# Patient Record
Sex: Male | Born: 1965 | Race: White | Hispanic: No | Marital: Married | Smoking: Former smoker
Health system: Southern US, Community
[De-identification: ages and names within clinical notes are randomized; demographics above are authoritative.]

## PROBLEM LIST (undated history)

## (undated) DIAGNOSIS — B20 Human immunodeficiency virus [HIV] disease: Secondary | ICD-10-CM

## (undated) DIAGNOSIS — F4321 Adjustment disorder with depressed mood: Secondary | ICD-10-CM

## (undated) DIAGNOSIS — D751 Secondary polycythemia: Secondary | ICD-10-CM

## (undated) DIAGNOSIS — D099 Carcinoma in situ, unspecified: Secondary | ICD-10-CM

## (undated) DIAGNOSIS — F329 Major depressive disorder, single episode, unspecified: Secondary | ICD-10-CM

## (undated) DIAGNOSIS — R7989 Other specified abnormal findings of blood chemistry: Secondary | ICD-10-CM

## (undated) DIAGNOSIS — C8580 Other specified types of non-Hodgkin lymphoma, unspecified site: Secondary | ICD-10-CM

## (undated) DIAGNOSIS — Z630 Problems in relationship with spouse or partner: Secondary | ICD-10-CM

## (undated) DIAGNOSIS — K602 Anal fissure, unspecified: Secondary | ICD-10-CM

## (undated) DIAGNOSIS — J309 Allergic rhinitis, unspecified: Secondary | ICD-10-CM

## (undated) DIAGNOSIS — F32A Depression, unspecified: Secondary | ICD-10-CM

## (undated) DIAGNOSIS — D696 Thrombocytopenia, unspecified: Secondary | ICD-10-CM

## (undated) DIAGNOSIS — F17200 Nicotine dependence, unspecified, uncomplicated: Secondary | ICD-10-CM

## (undated) DIAGNOSIS — E291 Testicular hypofunction: Secondary | ICD-10-CM

## (undated) DIAGNOSIS — D702 Other drug-induced agranulocytosis: Secondary | ICD-10-CM

## (undated) DIAGNOSIS — C8589 Other specified types of non-Hodgkin lymphoma, extranodal and solid organ sites: Secondary | ICD-10-CM

## (undated) DIAGNOSIS — S92911A Unspecified fracture of right toe(s), initial encounter for closed fracture: Secondary | ICD-10-CM

## (undated) HISTORY — DX: Depression, unspecified: F32.A

## (undated) HISTORY — DX: Secondary polycythemia: D75.1

## (undated) HISTORY — DX: Other specified types of non-hodgkin lymphoma, unspecified site: C85.80

## (undated) HISTORY — DX: Major depressive disorder, single episode, unspecified: F32.9

## (undated) HISTORY — DX: Other specified types of non-hodgkin lymphoma, extranodal and solid organ sites: C85.89

## (undated) HISTORY — DX: Unspecified fracture of right toe(s), initial encounter for closed fracture: S92.911A

## (undated) HISTORY — DX: Human immunodeficiency virus (HIV) disease: B20

## (undated) HISTORY — DX: Other drug-induced agranulocytosis: D70.2

## (undated) HISTORY — DX: Adjustment disorder with depressed mood: F43.21

## (undated) HISTORY — DX: Problems in relationship with spouse or partner: Z63.0

## (undated) HISTORY — DX: Allergic rhinitis, unspecified: J30.9

## (undated) HISTORY — DX: Testicular hypofunction: E29.1

## (undated) HISTORY — DX: Carcinoma in situ, unspecified: D09.9

## (undated) HISTORY — DX: Anal fissure, unspecified: K60.2

## (undated) HISTORY — DX: Thrombocytopenia, unspecified: D69.6

## (undated) HISTORY — DX: Nicotine dependence, unspecified, uncomplicated: F17.200

## (undated) HISTORY — DX: Other specified abnormal findings of blood chemistry: R79.89

---
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
## 2004-06-06 HISTORY — PX: NASAL SEPTUM SURGERY: SHX37

## 2006-06-20 ENCOUNTER — Ambulatory Visit (HOSPITAL_COMMUNITY): Admission: RE | Admit: 2006-06-20 | Discharge: 2006-06-20 | Payer: Self-pay | Admitting: Neurology

## 2008-01-24 ENCOUNTER — Ambulatory Visit: Payer: Self-pay | Admitting: Otolaryngology

## 2008-01-31 ENCOUNTER — Ambulatory Visit: Payer: Self-pay | Admitting: Otolaryngology

## 2008-05-29 ENCOUNTER — Emergency Department (HOSPITAL_COMMUNITY): Admission: EM | Admit: 2008-05-29 | Discharge: 2008-05-29 | Payer: Self-pay | Admitting: Emergency Medicine

## 2008-06-06 HISTORY — PX: OTHER SURGICAL HISTORY: SHX169

## 2008-11-09 ENCOUNTER — Inpatient Hospital Stay (HOSPITAL_COMMUNITY): Admission: EM | Admit: 2008-11-09 | Discharge: 2008-11-11 | Payer: Self-pay | Admitting: Emergency Medicine

## 2009-01-05 ENCOUNTER — Encounter: Admission: RE | Admit: 2009-01-05 | Discharge: 2009-01-05 | Payer: Self-pay | Admitting: Specialist

## 2009-01-09 ENCOUNTER — Ambulatory Visit (HOSPITAL_BASED_OUTPATIENT_CLINIC_OR_DEPARTMENT_OTHER): Admission: RE | Admit: 2009-01-09 | Discharge: 2009-01-09 | Payer: Self-pay | Admitting: Specialist

## 2010-04-23 ENCOUNTER — Encounter: Payer: Self-pay | Admitting: Pulmonary Disease

## 2010-04-27 ENCOUNTER — Ambulatory Visit: Payer: Self-pay | Admitting: Pulmonary Disease

## 2010-04-27 DIAGNOSIS — J159 Unspecified bacterial pneumonia: Secondary | ICD-10-CM | POA: Insufficient documentation

## 2010-04-27 DIAGNOSIS — J309 Allergic rhinitis, unspecified: Secondary | ICD-10-CM | POA: Insufficient documentation

## 2010-04-27 DIAGNOSIS — R51 Headache: Secondary | ICD-10-CM

## 2010-04-27 DIAGNOSIS — R519 Headache, unspecified: Secondary | ICD-10-CM | POA: Insufficient documentation

## 2010-04-28 ENCOUNTER — Encounter: Payer: Self-pay | Admitting: Pulmonary Disease

## 2010-05-27 ENCOUNTER — Ambulatory Visit: Payer: Self-pay | Admitting: Pulmonary Disease

## 2010-05-28 ENCOUNTER — Ambulatory Visit: Payer: Self-pay | Admitting: Pulmonary Disease

## 2010-06-06 HISTORY — PX: OTHER SURGICAL HISTORY: SHX169

## 2010-06-10 ENCOUNTER — Telehealth (INDEPENDENT_AMBULATORY_CARE_PROVIDER_SITE_OTHER): Payer: Self-pay | Admitting: *Deleted

## 2010-06-11 ENCOUNTER — Encounter: Payer: Self-pay | Admitting: Adult Health

## 2010-06-11 ENCOUNTER — Ambulatory Visit
Admission: RE | Admit: 2010-06-11 | Discharge: 2010-06-11 | Payer: Self-pay | Source: Home / Self Care | Attending: Adult Health | Admitting: Adult Health

## 2010-06-14 ENCOUNTER — Ambulatory Visit: Admission: RE | Admit: 2010-06-14 | Payer: Self-pay | Source: Home / Self Care | Admitting: Pulmonary Disease

## 2010-06-17 ENCOUNTER — Ambulatory Visit
Admission: RE | Admit: 2010-06-17 | Discharge: 2010-06-17 | Payer: Self-pay | Source: Home / Self Care | Attending: Pulmonary Disease | Admitting: Pulmonary Disease

## 2010-06-17 LAB — CONVERTED CEMR LAB
HIV-1 antibody: POSITIVE — AB
HIV-2 Ab: NEGATIVE
HIV: REACTIVE

## 2010-06-18 ENCOUNTER — Telehealth (INDEPENDENT_AMBULATORY_CARE_PROVIDER_SITE_OTHER): Payer: Self-pay | Admitting: *Deleted

## 2010-06-21 ENCOUNTER — Ambulatory Visit: Payer: Self-pay | Admitting: Cardiology

## 2010-06-22 ENCOUNTER — Telehealth: Payer: Self-pay | Admitting: Pulmonary Disease

## 2010-06-23 ENCOUNTER — Telehealth (INDEPENDENT_AMBULATORY_CARE_PROVIDER_SITE_OTHER): Payer: Self-pay | Admitting: *Deleted

## 2010-06-28 ENCOUNTER — Telehealth: Payer: Self-pay | Admitting: Pulmonary Disease

## 2010-06-30 ENCOUNTER — Telehealth: Payer: Self-pay | Admitting: Pulmonary Disease

## 2010-07-01 ENCOUNTER — Telehealth: Payer: Self-pay | Admitting: Pulmonary Disease

## 2010-07-02 ENCOUNTER — Ambulatory Visit
Admission: RE | Admit: 2010-07-02 | Discharge: 2010-07-02 | Payer: Self-pay | Source: Home / Self Care | Attending: Pulmonary Disease | Admitting: Pulmonary Disease

## 2010-07-04 LAB — CULTURE, RESPIRATORY W GRAM STAIN: Gram Stain: NONE SEEN

## 2010-07-05 ENCOUNTER — Telehealth: Payer: Self-pay

## 2010-07-05 DIAGNOSIS — B20 Human immunodeficiency virus [HIV] disease: Secondary | ICD-10-CM | POA: Insufficient documentation

## 2010-07-05 LAB — PNEUMOCYSTIS JIROVECI SMEAR BY DFA: Pneumocystis jiroveci Ag: NEGATIVE

## 2010-07-06 NOTE — Assessment & Plan Note (Signed)
Summary: ABNORMAL CHEST CT MULTIPLE NODULES/MHF   Visit Type:  Initial Consult Copy to:  pcp Primary Provider/Referring Provider:  Dr. Shela Commons [facility]  CC:  Pulmonary consult.  History of Present Illness: 44/M ex smoker for evaluation of abnormal imaging studies. He developed head cold with sore throat in oct '11 , CXR showed a RUL pneumonia  - initially given levaquin 750 x 5ds, when consolidation persisted on CXR  ,leavaquin 500 was given x 14 days & CT was obtained. CT chest 04/23/10 showed RUL wedge shaped consolidation with additional nodules & a peripheral LLL 6 mm nodule. There was a 2.0 x 1.3 cm right hilar LN. I have reviewed all images He denies chest pain, cough, hemoptysis, fevers or weight loss. He has been able to work through this illness.  Preventive Screening-Counseling & Management  Alcohol-Tobacco     Alcohol drinks/day: occ     Smoking Status: quit     Packs/Day: 1.5     Year Started: 1985     Year Quit: 2006  Current Medications (verified): 1)  Celexa 40 Mg Tabs (Citalopram Hydrobromide) .... Take 1 Tablet By Mouth Once A Day 2)  Flonase 50 Mcg/act Susp (Fluticasone Propionate) .... 2 Sprays Each Nostril Once Daily 3)  Levaquin 500 Mg Tabs (Levofloxacin) .... Take 1 Tablet By Mouth Once A Day  Allergies (verified): 1)  ! Sulfa  Past History:  Past Medical History: HEADACHE, CHRONIC (ICD-784.0) ALLERGIC RHINITIS (ICD-477.9)  Past Surgical History: Monteggias Fracture surgery- left arm 2010 Sinus Surgery-2009  Family History: Family History Emphysema -father Clotting disorders-father Family History Lung Cancer-paternal grandfather Family History C V A / Stroke -father  Social History: Marital Status: Single, lives with roommate Children: no Occupation:Project Analyst  Smoking Status:  quit Packs/Day:  1.5 Alcohol drinks/day:  occ  Review of Systems       The patient complains of shortness of breath with activity, coughing up blood,  chest pain, difficulty swallowing, sore throat, headaches, nasal congestion/difficulty breathing through nose, and fever.  The patient denies shortness of breath at rest, productive cough, non-productive cough, irregular heartbeats, acid heartburn, indigestion, loss of appetite, weight change, abdominal pain, tooth/dental problems, sneezing, itching, ear ache, anxiety, depression, hand/feet swelling, joint stiffness or pain, rash, and change in color of mucus.    Vital Signs:  Patient profile:   45 year old male Height:      69 inches Weight:      193.75 pounds BMI:     28.72 O2 Sat:      96 % on Room air Temp:     98.8 degrees F oral Pulse rate:   80 / minute BP sitting:   114 / 86  (left arm) Cuff size:   regular  Vitals Entered By: Zackery Barefoot CMA (April 27, 2010 9:37 AM)  O2 Flow:  Room air CC: Pulmonary consult Comments Medications reviewed with patient Verified contact number and pharmacy with patient Zackery Barefoot [facility]  April 27, 2010 9:37 AM    Physical Exam  Additional Exam:  wt 194 April 27, 2010  Gen. Pleasant, well-nourished, in no distress, normal affect ENT - no lesions, no post nasal drip Neck: No JVD, no thyromegaly, no carotid bruits Lungs: no use of accessory muscles, no dullness to percussion, clear without rales or rhonchi  Cardiovascular: Rhythm regular, heart sounds  normal, no murmurs or gallops, no peripheral edema Abdomen: soft and non-tender, no hepatosplenomegaly, BS normal. Musculoskeletal: No deformities, no cyanosis or clubbing Neuro:  alert, non  focal     Impression & Recommendations:  Problem # 1:  BACTERIAL PNEUMONIA (ICD-482.9) Favor slight atypical appearance of bacterial pneumonia with reactive lymphadenopathy given h/o acute illness Obtain labs COmplete course of levaquin Rpt CXR q month to resolution. RPt CT in 3 months Explained the need for FU imaging to resolution His updated medication list for this problem  includes:    Levaquin 500 Mg Tabs (Levofloxacin) .Marland Kitchen... Take 1 tablet by mouth once a day  Orders: TLB-CBC Platelet - w/Differential (85025-CBCD) TLB-BMP (Basic Metabolic Panel-BMET) (80048-METABOL) TLB-Hepatic/Liver Function Pnl (80076-HEPATIC) Consultation Level III (99243)Future Orders: T-2 View CXR (71020TC) ... 05/27/2010  Medications Added to Medication List This Visit: 1)  Celexa 40 Mg Tabs (Citalopram hydrobromide) .... Take 1 tablet by mouth once a day 2)  Flonase 50 Mcg/act Susp (Fluticasone propionate) .... 2 sprays each nostril once daily 3)  Levaquin 500 Mg Tabs (Levofloxacin) .... Take 1 tablet by mouth once a day  Patient Instructions: 1)  Copy sent to: Aleatha Borer 2)  Please schedule a follow-up appointment in 3 weeks with chest x ray     Immunization History:  Influenza Immunization History:    Influenza:  historical (03/08/2010)   Appended Document: ABNORMAL CHEST CT MULTIPLE NODULES/MHF labs 04/28/10 >> WC 4.4

## 2010-07-07 ENCOUNTER — Ambulatory Visit (INDEPENDENT_AMBULATORY_CARE_PROVIDER_SITE_OTHER): Payer: 59 | Admitting: Pulmonary Disease

## 2010-07-07 ENCOUNTER — Encounter: Payer: Self-pay | Admitting: Pulmonary Disease

## 2010-07-07 ENCOUNTER — Ambulatory Visit: Admit: 2010-07-07 | Payer: Self-pay | Admitting: Pulmonary Disease

## 2010-07-07 DIAGNOSIS — J159 Unspecified bacterial pneumonia: Secondary | ICD-10-CM

## 2010-07-07 DIAGNOSIS — B20 Human immunodeficiency virus [HIV] disease: Secondary | ICD-10-CM

## 2010-07-07 LAB — CONVERTED CEMR LAB
HIV-1 RNA Quant, Log: 5.16 — ABNORMAL HIGH (ref <1.30)
Hep A Total Ab: NEGATIVE
Hep B S Ab: NEGATIVE

## 2010-07-07 LAB — LEGIONELLA PROFILE(CULTURE+DFA/SMEAR): Legionella Antigen (DFA): NEGATIVE

## 2010-07-08 ENCOUNTER — Other Ambulatory Visit: Payer: Self-pay | Admitting: Pulmonary Disease

## 2010-07-08 DIAGNOSIS — R918 Other nonspecific abnormal finding of lung field: Secondary | ICD-10-CM

## 2010-07-08 DIAGNOSIS — R222 Localized swelling, mass and lump, trunk: Secondary | ICD-10-CM | POA: Insufficient documentation

## 2010-07-08 NOTE — Progress Notes (Signed)
Summary: speak to nurse  Phone Note Call from Patient Call back at [phone]   Caller: Patient Call For: alva Reason for Call: Talk to Nurse Summary of Call: Patient asking to speak to [facility].  He states he "hasn't been feeling well and that something is not right".  Patient scheduled for CT scan at the end of the month, but he is asking to speak to nurse about this. Initial call taken by: Lehman Prom,  June 10, 2010 4:52 PM  Follow-up for Phone Call        Spoke with pt.  He states that he has been having chest tightness and nightsweats since last seen and this was not going on before.  Appt with TP for 9 am tommorrow and go to ED sooner if needed.   Pt verbalized understanding. Follow-up by: Vernie Murders,  June 10, 2010 5:12 PM

## 2010-07-08 NOTE — Assessment & Plan Note (Signed)
Summary: NP follow up - PNA   Copy to:  pcp Primary Provider/Referring Provider:  Dr. Shela Commons. [facility]  CC:  2 week follow up - states still having right sided chest congestion and tightness in chest, feverish, night sweats, some wheezing / SOB x1week.  states symptoms improved after last OV, and then worsened again.  History of Present Illness: [name] ex smoker for evaluation of abnormal imaging studies. He developed head cold with sore throat in oct '11 , CXR showed a RUL pneumonia  - initially given levaquin 750 x 5ds, when consolidation persisted on CXR  ,leavaquin 500 was given x 14 days & CT was obtained. CT chest 04/23/10 showed RUL wedge shaped consolidation with additional nodules & a peripheral LLL 6 mm nodule. There was a 2.0 x 1.3 cm right hilar LN. I have reviewed all images  He has been able to work through this illness. Labs reviewed - WC nml, nml renal & liver fn  May 28, 2010  No new symptoms, When he lies down, he feels something on his right side. CXR shows persistent RUL consolidation. c/o night sweats, but he has had this x 20 yrs   June 11, 2010 --Presents for a 2 week follow up. States still having right sided chest congestion and tightness in chest, feverish, night sweats, some wheezing / SOB x1week.  states symptoms improved after last OV. His symptoms seem to wax and wane. Feels good today but yesterday has fatigue and night sweats. Pt is homosexual with several partners in past, one of which passed of HIV. He has never had a HIV test. He also has a friend being treated for TB exposure that he has visited recently. He does have birds in the house for years.   Medications Prior to Update: 1)  Celexa 40 Mg Tabs (Citalopram Hydrobromide) .... Take 1 Tablet By Mouth Once A Day 2)  Flonase 50 Mcg/act Susp (Fluticasone Propionate) .... 2 Sprays Each Nostril Once Daily  Current Medications (verified): 1)  Celexa 40 Mg Tabs (Citalopram Hydrobromide) .... Take 1  Tablet By Mouth Once A Day 2)  Flonase 50 Mcg/act Susp (Fluticasone Propionate) .... 2 Sprays Each Nostril Once Daily  Allergies (verified): 1)  ! Sulfa  Past History:  Past Medical History: Last updated: 04/27/2010 HEADACHE, CHRONIC (ICD-784.0) ALLERGIC RHINITIS (ICD-477.9)  Past Surgical History: Last updated: 04/27/2010 Monteggias Fracture surgery- left arm 2010 Sinus Surgery-2009  Family History: Last updated: 04/27/2010 Family History Emphysema -father Clotting disorders-father Family History Lung Cancer-paternal grandfather Family History C V A / Stroke -father  Social History: Last updated: 06/11/2010 Marital Status: Single, lives with roommate Children: no Occupation:Project Analyst  Former smoker age [age]  ~1PPD  Gay -  Birds -2 birds (cockateals)   Risk Factors: Smoking Status: quit (05/28/2010) Packs/Day: 1.5 (05/28/2010)  Social History: Marital Status: Single, lives with roommate Children: no Occupation:Project Analyst  Former smoker age [age]  ~1PPD  Gay -  Birds -2 birds (cockateals)   Review of Systems      See HPI  Vital Signs:  Patient profile:   45 year old male Height:      69 inches Weight:      191.13 pounds BMI:     28.33 O2 Sat:      97 % on Room air Temp:     99.2 degrees F oral Pulse rate:   82 / minute BP sitting:   124 / 84  (left arm) Cuff size:   regular  Vitals  Entered By: Boone Master CNA/MA (June 11, 2010 9:07 AM)  O2 Flow:  Room air CC: 2 week follow up - states still having right sided chest congestion and tightness in chest, feverish, night sweats, some wheezing / SOB x1week.  states symptoms improved after last OV, then worsened again Is Patient Diabetic? No Comments Medications reviewed with patient Daytime contact number verified with patient. Boone Master CNA/MA  June 11, 2010 9:06 AM    Physical Exam  Additional Exam:  wt 194 April 27, 2010 > 191 June 11, 2010  Gen. Pleasant,  well-nourished, in no distress, normal affect ENT - no lesions, no post nasal drip Neck: No JVD, no thyromegaly, no carotid bruits Lungs: no use of accessory muscles, no dullness to percussion, clear without rales or rhonchi  Cardiovascular: Rhythm regular, heart sounds  normal, no murmurs or gallops, no peripheral edema Musculoskeletal: No deformities, no cyanosis or clubbing      Impression & Recommendations:  Problem # 1:  BACTERIAL PNEUMONIA (ICD-482.9)  Persistent RUL Infiltrate -has upcoming follow up serial CT 07/05/10. pt w/ few risk factors will check for HIV and place PPD   Orders: T-HIV Antibody  (Reflex) ([id number]) Est. Patient Level IV (09811)  Complete Medication List: 1)  Celexa 40 Mg Tabs (Citalopram hydrobromide) .... Take 1 tablet by mouth once a day 2)  Flonase 50 Mcg/act Susp (Fluticasone propionate) .... 2 sprays each nostril once daily  Patient Instructions: 1)  Increase fluids and rest. 2)  Tylenol as needed  3)  Warm heat to chest 4)  Will call with lab results.  5)  Return on Monday 06/14/10 to read PPD (TB test).  6)  follow up Dr. Vassie Loll as scheduled  7)  follow up for CT as planned 1/30.  8)  Please contact office for sooner follow up if symptoms do not improve or worsen     Appended Document: Orders Update    Clinical Lists Changes  Orders: Added new Service order of TB Skin Test [phone]) - Signed Added new Service order of Admin 1st Vaccine (29562) - Signed Observations: Added new observation of TB-PPD LOT#: [id number] (06/11/2010 11:02) Added new observation of TB-PPD EXP: 09/30/2011 (06/11/2010 11:02) Added new observation of TB-PPD BY: Zackery Barefoot CMA (06/11/2010 11:02) Added new observation of TB-PPD RTE: ID (06/11/2010 11:02) Added new observation of TB-PPD DSE: 0.1 ml (06/11/2010 11:02) Added new observation of TB-PPD MFR: Sanofi Pasteur (06/11/2010 11:02) Added new observation of TB-PPD SITE: left forearm (06/11/2010  11:02) Added new observation of TB-PPD: PPD (06/11/2010 11:02)       Immunizations Administered:  PPD Skin Test:    Vaccine Type: PPD    Site: left forearm    Mfr: Sanofi Pasteur    Dose: 0.1 ml    Route: ID    Given by: Zackery Barefoot CMA    Exp. Date: 09/30/2011    Lot #: [id number]

## 2010-07-08 NOTE — Progress Notes (Signed)
Summary: ID referral  Phone Note Call from Patient   Caller: Patient Call For: alva Summary of Call: pt stated that [place] r told him to call back today if he had not heard back re: referral. work# [phone] Initial call taken by: Tivis Ringer, CNA,  June 23, 2010 3:40 PM  Follow-up for Phone Call        I did not advise pt to call, spoke with Katheren Shams and she advised TP did instruct pt to call if have not heard back from ID. Bjorn Loser do you  have an update on him getting an appointment. Zackery Barefoot CMA  June 24, 2010 11:50 AM   No, Tammy with ID stated that it would take about a month to get the appt when I spoke with her the other day. The ID Dept will call him when they schedule it. Alfonso Ramus  June 24, 2010 11:58 AM   Additional Follow-up for Phone Call Additional follow up Details #1::        LMOMTCB.Michel Bickers CMA  June 24, 2010 12:03 PM  PLEASE CALL PT BACK AFTER 2:00.Lacinda Axon  June 24, 2010 12:44 PM  LMOMTCB Vernie Murders  June 24, 2010 1:49 PM     Additional Follow-up for Phone Call Additional follow up Details #2::    Pt is aware it can take up to 1 month to get an appt with ID. Pt would like to know why it takes so long to get an appt. He wants to know if this will be the kind of care he can expect from ID? Is there any way to speed up the process for an appt? Pls advise.Michel Bickers [facility]  June 24, 2010 2:31 PM  Additional Follow-up for Phone Call Additional follow up Details #3:: Details for Additional Follow-up Action Taken: called pt will contact ID again to get definitive date-ok for 1 month out but just wants ov set up   Additional Follow-up by: Tammy Parrett NP,  June 24, 2010 5:40 PM  called spoke with Bjorn Loser [facility] to ask about the process for referral appts to ID.  per Bjorn Loser, each appt is made in the order that the referral was received.  so if, for example, pt has 15 referrals in front of his then ID will have to coordinate  and schedule those 15 appts before getting to his.  per Bjorn Loser, they can't pull his name in front of everyone else's as they service the entire [place] area.  she did give me her contact at ID: Tammy [phone].  per TP, please call patient and advise him of this.   Boone Master CNA/MA  June 25, 2010 10:04 AM   Called pt  and informed of ID referral process.  Contact name and number at ID given to pt.  Pt verbalized understanding. Abigail Miyamoto RN  June 25, 2010 10:13 AM

## 2010-07-08 NOTE — Progress Notes (Addendum)
Summary: ? procedure  Phone Note Call from Patient Call back at [phone]   Caller: Patient Call For: Mahaila Tischer Reason for Call: Talk to Nurse Summary of Call: Patient calling about procedure scheduled for tomorrow with Dr. Vassie Loll.  He needs to know if this is at [facility] or [facility]. Initial call taken by: Lehman Prom,  July 01, 2010 12:10 PM  Follow-up for Phone Call        Called and spoke with Marcelino Duster in cardio/pulm who advised pt can call her and will transfer to [facility] with instructions. Pt informed. [name] CMA  July 01, 2010 12:28 PM      Appended Document: New 042 addendum:  Discussed with Dr. Vassie Loll, new dx HIV, with pneumonia +/- malignancy Clinical Lists Changes  Orders: Added new Test order of T-HIV Viral Load [phone]) - Signed Added new Test order of T-HIV Genotype [phone]) - Signed Added new Test order of T-Hepatitis B Surface Antibody [phone]) - Signed Added new Test order of T-Hepatitis B Surface Antigen [phone]) - Signed Added new Test order of T-Hepatitis C Antibody ([id number]) - Signed Added new Test order of T-Hepatitis A Antibody ([id number]) - Signed Added new Test order of T-Syphilis Test ([facility]) [phone]) - Signed Added new Test order of T- GC Chlamydia ([id number]) - Signed Added new Test order of T-GC Probe, urine ([id number]) - Signed Added new Test order of T-CD4SP ([facility]) (CD4SP) - Signed

## 2010-07-08 NOTE — Progress Notes (Signed)
Summary: Call report on CT chest  Phone Note From Other Clinic   Caller: [facility] Summary of Call: Erskine Squibb called to give call report on pt CT chest. Dr. Vassie Loll has already signed off on results but I will forward message to him to see if anything further is needed.  pt has appt with Dr. Vassie Loll on 07-07-2010. Initial call taken by: Carron Curie CMA,  June 22, 2010 11:07 AM  Follow-up for Phone Call        will discuss with pt on fu visit Follow-up by: Comer Locket. Vassie Loll MD,  June 24, 2010 11:06 PM

## 2010-07-08 NOTE — Progress Notes (Signed)
Summary: speak to nurse  Phone Note Other Incoming Call back at [phone]   Caller: corey//[facility] Summary of Call: Wants to speak to nurse in ref to positive hiv on 1/6. Initial call taken by: Darletta Moll,  June 28, 2010 8:48 AM  Follow-up for Phone Call        Denyse Amass states he received the medical records he needed from health information. He did inquire on pt's TB results. Per Boone Master pt's TB skin test was negative. Zackery Barefoot CMA  June 28, 2010 11:08 AM

## 2010-07-08 NOTE — Progress Notes (Signed)
Summary: wants to know if referral has been made  Phone Note Call from Patient   Caller: Patient Call For: tammy parrett Summary of Call: Patient called back to check on referral that Shanda Bumps was going to set up for him. He didn't want to disclose what the referral was for. Patient can be reached on his cell [phone] Initial call taken by: Vedia Coffer,  June 18, 2010 2:21 PM  Follow-up for Phone Call        yes it has been made we are waiting to here from them if not heard from them by lunch on 1/18 please call back  Follow-up by: Rubye Oaks NP,  June 18, 2010 2:33 PM  Additional Follow-up for Phone Call Additional follow up Details #1::        Spoke with pt and notified of the above recs per TP.  Pt verbalized understanding. Additional Follow-up by: Vernie Murders,  June 18, 2010 2:52 PM

## 2010-07-08 NOTE — Assessment & Plan Note (Signed)
Summary: NP follow up - lab results   Copy to:  pcp Primary Provider/Referring Provider:  Dr. Shela Commons. [facility]  CC:  ov to discuss lab results and CT.  History of Present Illness: 44/M ex smoker for evaluation of abnormal imaging studies. He developed head cold with sore throat in oct '11 , CXR showed a RUL pneumonia  - initially given levaquin 750 x 5ds, when consolidation persisted on CXR  ,leavaquin 500 was given x 14 days & CT was obtained. CT chest 04/23/10 showed RUL wedge shaped consolidation with additional nodules & a peripheral LLL 6 mm nodule. There was a 2.0 x 1.3 cm right hilar LN. I have reviewed all images  He has been able to work through this illness. Labs reviewed - WC nml, nml renal & liver fn  May 28, 2010  No new symptoms, When he lies down, he feels something on his right side. CXR shows persistent RUL consolidation. c/o night sweats, but he has had this x 20 yrs   June 11, 2010 --Presents for a 2 week follow up. States still having right sided chest congestion and tightness in chest, feverish, night sweats, some wheezing / SOB x1week.  states symptoms improved after last OV. His symptoms seem to wax and wane. Feels good today but yesterday has fatigue and night sweats. Pt is homosexual with several partners in past, one of which passed of HIV. He has never had a HIV test. He also has a friend being treated for TB exposure that he has visited recently. He does have birds in the house for years.   June 17, 2010 --Presents for an ov to discuss lab resutls.Last ov pt w/ persistent RUL consolidation on xray w/ persistent  constituitional symptoms-  feverish, night sweats. A PPD was placed which was neg.  HIV testing was done for hx of possible exposure from partner that passed from AIDS (2002). HIV testing was reactive  and  w/ confirmatory western blot with HIV-1gG Ab  was positive. HIV -2 Ab neg.  I reviewed this with pt and discussed diagnosis. Pt verbalized  understanding. We discussed that he will be referred to ID clinic and he is in aggreement. We are going to set up CT scan 1 week earlier and follow up Dr. Vassie Loll to discuss results.   Medications Prior to Update: 1)  Celexa 40 Mg Tabs (Citalopram Hydrobromide) .... Take 1 Tablet By Mouth Once A Day 2)  Flonase 50 Mcg/act Susp (Fluticasone Propionate) .... 2 Sprays Each Nostril Once Daily  Current Medications (verified): 1)  Celexa 40 Mg Tabs (Citalopram Hydrobromide) .... Take 1 Tablet By Mouth Once A Day 2)  Flonase 50 Mcg/act Susp (Fluticasone Propionate) .... 2 Sprays Each Nostril Once Daily  Allergies (verified): 1)  ! Sulfa  Past History:  Past Medical History: Last updated: 04/27/2010 HEADACHE, CHRONIC (ICD-784.0) ALLERGIC RHINITIS (ICD-477.9)  Past Surgical History: Last updated: 04/27/2010 Monteggias Fracture surgery- left arm 2010 Sinus Surgery-2009  Family History: Last updated: 04/27/2010 Family History Emphysema -father Clotting disorders-father Family History Lung Cancer-paternal grandfather Family History C V A / Stroke -father  Social History: Last updated: 06/11/2010 Marital Status: Single, lives with roommate Children: no Occupation:Project Analyst  Former smoker age [age]  ~1PPD  Gay -  Birds -2 birds (cockateals)   Risk Factors: Smoking Status: quit (05/28/2010) Packs/Day: 1.5 (05/28/2010)  Review of Systems      See HPI  Vital Signs:  Patient profile:   45 year old male Height:  69 inches Weight:      191.13 pounds BMI:     28.33 O2 Sat:      97 % on Room air Temp:     97.8 degrees F oral Pulse rate:   79 / minute BP sitting:   122 / 78  (left arm) Cuff size:   regular  Vitals Entered By: Boone Master CNA/MA (June 17, 2010 4:41 PM)  O2 Flow:  Room air CC: ov to discuss lab results and CT Is Patient Diabetic? No Comments Medications reviewed with patient Daytime contact number verified with patient. Boone Master CNA/MA   June 17, 2010 4:41 PM    Physical Exam  Additional Exam:  wt 194 April 27, 2010 > 191 June 11, 2010 > 191 06/17/10/ Gen. Pleasant, well-nourished, in no distress, normal affect ENT - no lesions, no post nasal drip Neck: No JVD, no thyromegaly, no carotid bruits Lungs: no use of accessory muscles, no dullness to percussion, clear without rales or rhonchi  Cardiovascular: Rhythm regular, heart sounds  normal, no murmurs or gallops, no peripheral edema Musculoskeletal: No deformities, no cyanosis or clubbing      Impression & Recommendations:  Problem # 1:  BACTERIAL PNEUMONIA (ICD-482.9) HIV testing - showed REactive w/ western blot positive 1/2  refer to ID clinic  Will repeat CT scan. w/ follow up Dr. Vassie Loll  may need FOB if not improved  Plan;  Increase fluids and rest. Tylenol as needed  The ID clinic will be in touch with you please call back if not contacted by 1/ 16/12.  We are going to move up your date for CT scan-we will contact you.  Please contact office for sooner follow up if symptoms do not improve or worsen    Orders: Infectious Disease Referral (ID) Radiology Referral (Radiology) Est. Patient Level III (16109)  Patient Instructions: 1)  Increase fluids and rest. 2)  Tylenol as needed  3)  The ID clinic will be in touch with you please call back 4)  if not contacted by 1/ 16/12.  5)  We are going to move up your date for CT scan-we will contact you.  6)  Please contact office for sooner follow up if symptoms do not improve or worsen

## 2010-07-08 NOTE — Assessment & Plan Note (Signed)
Summary: follow up with cxr in [place] per pt late appt//jwr   Visit Type:  Follow-up Copy to:  pcp Primary Provider/Referring Provider:  Dr. Shela Commons. [facility]  CC:  Follow up. Pt c/o chest congestion and  right chest pain.  History of Present Illness: 44/M ex smoker for evaluation of abnormal imaging studies. He developed head cold with sore throat in oct '11 , CXR showed a RUL pneumonia  - initially given levaquin 750 x 5ds, when consolidation persisted on CXR  ,leavaquin 500 was given x 14 days & CT was obtained. CT chest 04/23/10 showed RUL wedge shaped consolidation with additional nodules & a peripheral LLL 6 mm nodule. There was a 2.0 x 1.3 cm right hilar LN. I have reviewed all images  He has been able to work through this illness. Labs reviewed - WC nml, nml renal & liver fn  May 28, 2010 3:02 PM  No new symptoms, When he lies down, he feels something on his right side. CXR shows persistent RUL consolidation. c/o night sweats, but he has had this x 20 yrs He denies chest pain, cough, dyspnea, hemoptysis, fevers or weight loss.  Preventive Screening-Counseling & Management  Alcohol-Tobacco     Alcohol drinks/day: occ     Smoking Status: quit     Packs/Day: 1.5     Year Started: 1985     Year Quit: 2006  Current Medications (verified): 1)  Celexa 40 Mg Tabs (Citalopram Hydrobromide) .... Take 1 Tablet By Mouth Once A Day 2)  Flonase 50 Mcg/act Susp (Fluticasone Propionate) .... 2 Sprays Each Nostril Once Daily  Allergies (verified): 1)  ! Sulfa  Past History:  Past Medical History: Last updated: 04/27/2010 HEADACHE, CHRONIC (ICD-784.0) ALLERGIC RHINITIS (ICD-477.9)  Social History: Last updated: 04/27/2010 Marital Status: Single, lives with roommate Children: no Occupation:Project Analyst   Review of Systems  The patient denies anorexia, fever, weight loss, weight gain, vision loss, decreased hearing, hoarseness, chest pain, syncope, dyspnea on exertion,  peripheral edema, prolonged cough, headaches, hemoptysis, abdominal pain, melena, hematochezia, severe indigestion/heartburn, hematuria, muscle weakness, suspicious skin lesions, difficulty walking, depression, unusual weight change, abnormal bleeding, enlarged lymph nodes, and angioedema.    Vital Signs:  Patient profile:   45 year old male Height:      69 inches Weight:      193 pounds BMI:     28.60 O2 Sat:      96 % on Room air Temp:     98.7 degrees F oral Pulse rate:   80 / minute BP sitting:   100 / 70  (left arm) Cuff size:   regular  Vitals Entered By: Zackery Barefoot CMA (May 28, 2010 2:36 PM)  O2 Flow:  Room air CC: Follow up. Pt c/o chest congestion and  right chest pain Comments Medications reviewed with patient Verified contact number and pharmacy with patient Zackery Barefoot [facility]  May 28, 2010 2:36 PM    Physical Exam  Additional Exam:  wt 194 April 27, 2010  Gen. 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 Patient Level III ([address]) Radiology Referral (Radiology)  Patient Instructions: 1)  Please schedule a follow-up appointment in 6 weeks after CT scan 2)  A Chest CT with Contrast has been recommended.  Your imaging study may require preauthorization.

## 2010-07-08 NOTE — Op Note (Signed)
  [name], [name]            ACCOUNT NO.:  [id number]  MEDICAL RECORD NO.:  [id number]          PATIENT TYPE:  AMB  LOCATION:  CARD                         FACILITY:  [facility]  PHYSICIAN:  Oretha Milch, MD      DATE OF BIRTH:  1965/12/15  DATE OF PROCEDURE:  07/02/2010 DATE OF DISCHARGE:                              OPERATIVE REPORT   PROCEDURE PERFORMED:  Video bronchoscopy with biopsies.  INDICATIONS FOR PROCEDURE:  Right upper lobe persistent consolidation since November 2011 in this 45 year old nonsmoker with recently diagnosed HIV infection.  His symptoms include pleuritic chest pain, minimal coughing, and night sweats.  DESCRIPTION OF PROCEDURE:  Written informed consent was obtained from the patient prior to the procedure.  Risks of the procedure including coughing, bleeding,  and a small chance of lung puncture requiring a chest tube were discussed in detail.  A 6 mg of Versed and 150 mcg of fentanyl were used in divided doses during the procedure.  Bronchoscope was inserted from the right naris, upper airway appeared normal.  Vocal cords showed normal appearance and motion.  A 2% lidocaine was used above the cords and 1% lidocaine was used below the cords.  The tracheobronchial tree was inspected up to the subsegmental level.  Left- sided and the right lower lobe airways appeared normal.  Attention was then turned to the right upper lobe.  In the medial subsegment of the apical segment of the right upper lobe, a white endobronchial lesion was noted obscuring the orifice.  Brushings were obtained from this area. Lavage was also obtained from the apical segment of right upper lobe. Transbronchial biopsies were then obtained from this subsegment and the adjoining subsegment of the apical segment of the right upper lobe.  The forceps could be passed beyond the lesion.  The patient tolerated the procedure well with minimal bleeding.  One such biopsy was sent in saline  for AFB culture.  A chest x-ray will be performed to rule out presence of pneumothorax.     Oretha Milch, MD     RVA/MEDQ  D:  07/02/2010  T:  07/02/2010  Job:  [id number]  Electronically Signed by Cyril Mourning MD on 07/08/2010 03:49:08 PM

## 2010-07-08 NOTE — Progress Notes (Signed)
Summary: returned call  Phone Note Call from Patient   Caller: Patient Call For: Kentrell Guettler Summary of Call: pt returned call from dr Vassie Loll. pt # [id number] Initial call taken by: Tivis Ringer, CNA,  June 30, 2010 9:02 AM  Follow-up for Phone Call        Spoke with pt.  He states that RA called and left him msg to call him back arounf 4:40 pm 1/24.  I advised that RA in the office this pm and fill forward him msg so he can return call.  He would like for you to call his cell [phone]. thanks! Follow-up by: Vernie Murders,  June 30, 2010 9:18 AM  Additional Follow-up for Phone Call Additional follow up Details #1::        discussed risks & benefits of bscopy scheduled for friday Additional Follow-up by: Comer Locket. Vassie Loll MD,  June 30, 2010 4:04 PM

## 2010-07-12 ENCOUNTER — Telehealth: Payer: Self-pay | Admitting: Pulmonary Disease

## 2010-07-13 ENCOUNTER — Telehealth (INDEPENDENT_AMBULATORY_CARE_PROVIDER_SITE_OTHER): Payer: Self-pay | Admitting: *Deleted

## 2010-07-14 NOTE — Progress Notes (Addendum)
  Phone Note Other Incoming   Summary of Call: Kim from ID at [facility] asked if we got the intake apaperwork & when he was being seen. checked with Tomasita Morrow. she will address this this week Initial call taken by: Golden Circle RN,  July 05, 2010 12:07 PM  New Problems: HIV INFECTION (ICD-042)   New Problems: HIV INFECTION (ICD-042)  Appended Document:  Royanne Foots told me about this pt. I can work him in once we get his data in the system. He should also see Tammy with intake. They will draw labs tomorrow

## 2010-07-14 NOTE — Assessment & Plan Note (Signed)
Summary: 6 wk follow up post ct/jwr   Vital Signs:  Patient profile:   45 year old male Height:      69 inches Weight:      187.6 pounds BMI:     27.80 O2 Sat:      96 % on Room air Temp:     98.2 degrees F oral Pulse rate:   88 / minute BP sitting:   112 / 78  (left arm) Cuff size:   regular  Vitals Entered By: Zackery Barefoot CMA (July 07, 2010 9:05 AM)  O2 Flow:  Room air CC: 6 week follow up. Pt c/o fevers at night range T-99, 100, cold, sweats, wheezing at night, DOE Comments Medications reviewed with patient Verified contact number and pharmacy with patient Zackery Barefoot CMA  July 07, 2010 9:06 AM    Visit Type:  Follow-up Copy to:  pcp Primary Provider/Referring Provider:  Dr. Shela Commons. [facility]  CC:  6 week follow up. Pt c/o fevers at night range T-99, 100, cold, sweats, wheezing at night, and DOE.  History of Present Illness: 44/M ex smoker for FU of persistent RUL consolidation since oct '11 He developed head cold with sore throat in oct '11 , CXR showed a RUL pneumonia  - initially given levaquin 750 x 5ds, when consolidation persisted on CXR  ,leavaquin 500 was given x 14 days & CT was obtained. CT chest 04/23/10 showed RUL wedge shaped consolidation with additional nodules & a peripheral LLL 6 mm nodule. There was a 2.0 x 1.3 cm right hilar LN. I have reviewed all images  He has been able to work through this illness. Labs reviewed - WC nml, nml renal & liver fn Pt is homosexual with several partners in past, one of which passed of HIV. He has never had a HIV test. He also has a friend being treated for TB exposure that he has visited recently. He does have birds in the house for years.   PPD was neg.  HIV pos.  July 07, 2010  RPt Ct showed persistent consolidation & new LUL 7 mm nodule. Bronchoscopy showed whitish endobronchial lesion in apical subsegment of RUL - brushings & TBBX >> markedly atypical cells s/o lymphoproliferative process,  special stains neg, afb/ fungal neg, resp cx - MSSA. I discussed these findings with him today   Preventive Screening-Counseling & Management  Alcohol-Tobacco     Alcohol drinks/day: occ     Smoking Status: quit     Packs/Day: 1.5     Year Started: 1985     Year Quit: 2006  Current Medications (verified): 1)  Celexa 40 Mg Tabs (Citalopram Hydrobromide) .... Take 1 Tablet By Mouth Once A Day 2)  Flonase 50 Mcg/act Susp (Fluticasone Propionate) .... 2 Sprays Each Nostril Once Daily 3)  Pramosone 1-2.5 % Lotn (Pramoxine-Hc) .... As Needed  Allergies (verified): 1)  ! Sulfa  Past History:  Past Medical History: Last updated: 04/27/2010 HEADACHE, CHRONIC (ICD-784.0) ALLERGIC RHINITIS (ICD-477.9)  Social History: Last updated: 06/11/2010 Marital Status: Single, lives with roommate Children: no Occupation:Project Analyst  Former smoker age [age]  ~1PPD  Gay -  Birds -2 birds (cockateals)   Review of Systems  The patient denies anorexia, fever, weight loss, weight gain, vision loss, decreased hearing, hoarseness, chest pain, syncope, dyspnea on exertion, peripheral edema, prolonged cough, headaches, hemoptysis, abdominal pain, melena, hematochezia, severe indigestion/heartburn, hematuria, muscle weakness, suspicious skin lesions, difficulty walking, depression, unusual weight change, abnormal bleeding, enlarged  lymph nodes, and angioedema.    Physical Exam  Additional Exam:  wt 194 April 27, 2010 > 191 June 11, 2010  Gen. 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 His updated medication list for this problem includes:    Avelox 400 Mg Tabs (Moxifloxacin hcl) ..... Once daily  Orders: Est. Patient Level V [phone]) Prescription Created Electronically [phone])  Problem # 2:  HIV INFECTION (ICD-042) D/w Dr Osvaldo Angst hep profile, CD4 , viral load & STD screen as suggested His updated medication list for this problem includes:    Avelox 400 Mg Tabs (Moxifloxacin hcl) ..... Once daily  Orders: T-Chlamydia Probe, genital [phone]) T-Chlamydia  Probe, urine [phone]) T-Hepatitis B Surface Antigen [phone]) T-Hepatitis B Surface Antibody [phone]) T-Hepatitis C Antibody ([phone]) T-RPR (Syphilis) (54270-62376) T- * Misc. Laboratory test [phone]) Est. Patient Level V [phone]) Prescription Created Electronically [phone])  Medications Added to Medication List This Visit: 1)  Pramosone 1-2.5 % Lotn (Pramoxine-hc) .... As needed 2)  Avelox 400 Mg Tabs (Moxifloxacin hcl) .... Once daily  Patient Instructions: 1)  Copy sent to: Dr Daiva Eves 2)  Please schedule a follow-up appointment in 2 weeks. 3)  Antibiotic x 10 days 4)  Blood work today - per rinfectoius disease MD 5)  I will review your findings with other doctors & coe up with plan Prescriptions: AVELOX 400 MG TABS (MOXIFLOXACIN HCL) once daily  #10 x 0   Entered and Authorized by:   Comer Locket Vassie Loll MD   Signed by:   Comer Locket Vassie Loll MD on 07/07/2010   Method used:   Electronically to        [facility]  [address] [phone]* (retail)       [address]       [place], [place]  [address]       Ph: [phone] or [phone]       Fax:  [phone]   RxID:   [id number]    Orders Added: 1)  T-Chlamydia Probe, genital [[id number]] 2)  T-Chlamydia  Probe, urine [phone] 3)  T-Hepatitis B Surface Antigen [[id number]] 4)  T-Hepatitis B Surface Antibody [[id number]] 5)  T-Hepatitis C Antibody [[id number]] 6)  T-RPR (Syphilis) [[phone]] 7)  T- * Misc. Laboratory test [99999] 8)  Est. Patient Level V [53299] 9)  Prescription Created Electronically [phone]     Appended Document: Orders Update Discussed at cancer conference d/w pt >> proceed with PET scan & PFTs   Clinical Lists Changes  Problems: Added new problem of MASS, LUNG (ICD-786.6) Orders: Added new Referral order of Radiology Referral (Radiology) - Signed Added new Referral order of Pulmonary Referral (Pulmonary) - Signed

## 2010-07-16 ENCOUNTER — Inpatient Hospital Stay (HOSPITAL_COMMUNITY): Payer: 59

## 2010-07-16 ENCOUNTER — Inpatient Hospital Stay (HOSPITAL_COMMUNITY)
Admission: AD | Admit: 2010-07-16 | Discharge: 2010-07-23 | DRG: 976 | Disposition: A | Discharge status: Home / Self Care | Payer: 59 | Source: Ambulatory Visit | Attending: Internal Medicine | Admitting: Internal Medicine

## 2010-07-16 ENCOUNTER — Encounter: Payer: Self-pay | Admitting: Infectious Disease

## 2010-07-16 ENCOUNTER — Encounter: Payer: Self-pay | Admitting: Internal Medicine

## 2010-07-16 ENCOUNTER — Ambulatory Visit (INDEPENDENT_AMBULATORY_CARE_PROVIDER_SITE_OTHER): Payer: 59 | Admitting: Infectious Disease

## 2010-07-16 DIAGNOSIS — L989 Disorder of the skin and subcutaneous tissue, unspecified: Secondary | ICD-10-CM | POA: Diagnosis present

## 2010-07-16 DIAGNOSIS — R222 Localized swelling, mass and lump, trunk: Secondary | ICD-10-CM

## 2010-07-16 DIAGNOSIS — B20 Human immunodeficiency virus [HIV] disease: Secondary | ICD-10-CM

## 2010-07-16 DIAGNOSIS — K649 Unspecified hemorrhoids: Secondary | ICD-10-CM | POA: Diagnosis present

## 2010-07-16 DIAGNOSIS — J329 Chronic sinusitis, unspecified: Secondary | ICD-10-CM | POA: Diagnosis present

## 2010-07-16 DIAGNOSIS — F341 Dysthymic disorder: Secondary | ICD-10-CM | POA: Diagnosis present

## 2010-07-16 DIAGNOSIS — R0602 Shortness of breath: Secondary | ICD-10-CM

## 2010-07-16 DIAGNOSIS — R509 Fever, unspecified: Secondary | ICD-10-CM

## 2010-07-16 DIAGNOSIS — Z23 Encounter for immunization: Secondary | ICD-10-CM

## 2010-07-16 DIAGNOSIS — Z9189 Other specified personal risk factors, not elsewhere classified: Secondary | ICD-10-CM

## 2010-07-16 DIAGNOSIS — J189 Pneumonia, unspecified organism: Secondary | ICD-10-CM

## 2010-07-16 DIAGNOSIS — C8588 Other specified types of non-Hodgkin lymphoma, lymph nodes of multiple sites: Secondary | ICD-10-CM | POA: Diagnosis present

## 2010-07-16 LAB — COMPREHENSIVE METABOLIC PANEL
Alkaline Phosphatase: 72 U/L (ref 39–117)
BUN: 10 mg/dL (ref 6–23)
Creatinine, Ser: 0.88 mg/dL (ref 0.4–1.5)
Glucose, Bld: 73 mg/dL (ref 70–99)
Potassium: 3.8 mEq/L (ref 3.5–5.1)
Total Protein: 7.2 g/dL (ref 6.0–8.3)

## 2010-07-16 LAB — URINALYSIS, ROUTINE W REFLEX MICROSCOPIC
Leukocytes, UA: NEGATIVE
Protein, ur: NEGATIVE mg/dL
Specific Gravity, Urine: 1.025 (ref 1.005–1.030)
Urine Glucose, Fasting: NEGATIVE mg/dL
Urobilinogen, UA: 0.2 mg/dL (ref 0.0–1.0)

## 2010-07-16 LAB — CBC
HCT: 36.6 % — ABNORMAL LOW (ref 39.0–52.0)
MCH: 30.2 pg (ref 26.0–34.0)
MCHC: 33.3 g/dL (ref 30.0–36.0)
MCV: 90.6 fL (ref 78.0–100.0)
RDW: 13.1 % (ref 11.5–15.5)
WBC: 7.1 10*3/uL (ref 4.0–10.5)

## 2010-07-16 LAB — URINE MICROSCOPIC-ADD ON

## 2010-07-17 DIAGNOSIS — R509 Fever, unspecified: Secondary | ICD-10-CM

## 2010-07-17 DIAGNOSIS — R222 Localized swelling, mass and lump, trunk: Secondary | ICD-10-CM

## 2010-07-17 DIAGNOSIS — B20 Human immunodeficiency virus [HIV] disease: Secondary | ICD-10-CM

## 2010-07-17 LAB — INFLUENZA PANEL BY PCR (TYPE A & B)
H1N1 flu by pcr: NOT DETECTED
Influenza A By PCR: NEGATIVE

## 2010-07-18 LAB — URINE CULTURE
Colony Count: NO GROWTH
Culture: NO GROWTH
Special Requests: NEGATIVE

## 2010-07-18 LAB — LEGIONELLA ANTIGEN, URINE: Legionella Antigen, Urine: NEGATIVE

## 2010-07-19 ENCOUNTER — Encounter (HOSPITAL_COMMUNITY)
Admission: RE | Admit: 2010-07-19 | Discharge: 2010-07-19 | Disposition: A | Discharge status: Home / Self Care | Payer: 59 | Source: Ambulatory Visit | Attending: Pulmonary Disease | Admitting: Pulmonary Disease

## 2010-07-19 ENCOUNTER — Encounter: Payer: Self-pay | Admitting: Pulmonary Disease

## 2010-07-19 DIAGNOSIS — B20 Human immunodeficiency virus [HIV] disease: Secondary | ICD-10-CM

## 2010-07-19 DIAGNOSIS — K7689 Other specified diseases of liver: Secondary | ICD-10-CM | POA: Insufficient documentation

## 2010-07-19 DIAGNOSIS — C8588 Other specified types of non-Hodgkin lymphoma, lymph nodes of multiple sites: Secondary | ICD-10-CM

## 2010-07-19 DIAGNOSIS — R222 Localized swelling, mass and lump, trunk: Secondary | ICD-10-CM | POA: Insufficient documentation

## 2010-07-19 DIAGNOSIS — J189 Pneumonia, unspecified organism: Secondary | ICD-10-CM

## 2010-07-19 DIAGNOSIS — C781 Secondary malignant neoplasm of mediastinum: Secondary | ICD-10-CM | POA: Insufficient documentation

## 2010-07-19 DIAGNOSIS — R918 Other nonspecific abnormal finding of lung field: Secondary | ICD-10-CM | POA: Insufficient documentation

## 2010-07-19 DIAGNOSIS — R509 Fever, unspecified: Secondary | ICD-10-CM

## 2010-07-19 LAB — APTT: aPTT: 31 seconds (ref 24–37)

## 2010-07-19 LAB — BASIC METABOLIC PANEL
CO2: 26 mEq/L (ref 19–32)
Glucose, Bld: 86 mg/dL (ref 70–99)
Potassium: 4.3 mEq/L (ref 3.5–5.1)
Sodium: 133 mEq/L — ABNORMAL LOW (ref 135–145)

## 2010-07-19 LAB — CBC
HCT: 34.5 % — ABNORMAL LOW (ref 39.0–52.0)
Hemoglobin: 11.7 g/dL — ABNORMAL LOW (ref 13.0–17.0)
MCHC: 33.9 g/dL (ref 30.0–36.0)
MCV: 89.8 fL (ref 78.0–100.0)

## 2010-07-19 LAB — GLUCOSE, CAPILLARY: Glucose-Capillary: 107 mg/dL — ABNORMAL HIGH (ref 70–99)

## 2010-07-19 MED ORDER — FLUDEOXYGLUCOSE F - 18 (FDG) INJECTION
17.3000 | Freq: Once | INTRAVENOUS | Status: AC | PRN
  Administered 2010-07-19: 17.3 via INTRAVENOUS

## 2010-07-20 ENCOUNTER — Inpatient Hospital Stay (HOSPITAL_COMMUNITY): Payer: 59

## 2010-07-20 ENCOUNTER — Other Ambulatory Visit: Payer: Self-pay | Admitting: Interventional Radiology

## 2010-07-20 ENCOUNTER — Other Ambulatory Visit (HOSPITAL_COMMUNITY): Payer: 59

## 2010-07-20 ENCOUNTER — Encounter: Payer: Self-pay | Admitting: Pulmonary Disease

## 2010-07-20 DIAGNOSIS — R509 Fever, unspecified: Secondary | ICD-10-CM

## 2010-07-20 DIAGNOSIS — B20 Human immunodeficiency virus [HIV] disease: Secondary | ICD-10-CM

## 2010-07-20 DIAGNOSIS — R222 Localized swelling, mass and lump, trunk: Secondary | ICD-10-CM

## 2010-07-21 ENCOUNTER — Ambulatory Visit: Payer: 59 | Admitting: Pulmonary Disease

## 2010-07-21 ENCOUNTER — Ambulatory Visit: Payer: 59

## 2010-07-21 DIAGNOSIS — J189 Pneumonia, unspecified organism: Secondary | ICD-10-CM

## 2010-07-21 DIAGNOSIS — R222 Localized swelling, mass and lump, trunk: Secondary | ICD-10-CM

## 2010-07-21 DIAGNOSIS — B20 Human immunodeficiency virus [HIV] disease: Secondary | ICD-10-CM

## 2010-07-21 DIAGNOSIS — R509 Fever, unspecified: Secondary | ICD-10-CM

## 2010-07-21 LAB — HIV-1 RNA QUANT-NO REFLEX-BLD
HIV 1 RNA Quant: 129000 copies/mL — ABNORMAL HIGH (ref <20)
HIV-1 RNA Quant, Log: 5.11 {Log} — ABNORMAL HIGH (ref <1.30)

## 2010-07-21 LAB — CRYPTOCOCCUS ANTIBODY: Cryptococcus Antibodies, Quant: <1:16 {titer}

## 2010-07-21 LAB — ANTI-NEUTROPHIL ANTIBODY

## 2010-07-22 ENCOUNTER — Other Ambulatory Visit: Payer: Self-pay | Admitting: Internal Medicine

## 2010-07-22 DIAGNOSIS — C8589 Other specified types of non-Hodgkin lymphoma, extranodal and solid organ sites: Secondary | ICD-10-CM

## 2010-07-22 LAB — CBC
HCT: 34 % — ABNORMAL LOW (ref 39.0–52.0)
MCHC: 33.5 g/dL (ref 30.0–36.0)
RDW: 13 % (ref 11.5–15.5)

## 2010-07-22 LAB — DIFFERENTIAL
Eosinophils Absolute: 0 10*3/uL (ref 0.0–0.7)
Monocytes Absolute: 1.1 10*3/uL — ABNORMAL HIGH (ref 0.1–1.0)
Neutrophils Relative %: 78 % — ABNORMAL HIGH (ref 43–77)

## 2010-07-22 LAB — HISTOPLASMA ANTIGEN, URINE

## 2010-07-22 NOTE — Progress Notes (Signed)
Summary: lab results  Phone Note Call from Patient   Caller: Patient Call For: ALVA Summary of Call: Patient phoned and wanted to know it there had been any additional lab results to come in. He can be reached at [phone] unitl 5:00pm Initial call taken by: Vedia Coffer,  July 12, 2010 4:08 PM  Follow-up for Phone Call        Spoke with pt.  He is requesting lab results from 07/07/10.  Pls advise thanks Follow-up by: Vernie Murders,  July 12, 2010 4:12 PM  Additional Follow-up for Phone Call Additional follow up Details #1::        hep profile, syphilis screen neg Waiting on cd4 count & viral load - JR can you chk on this pl ?? Additional Follow-up by: Comer Locket. Vassie Loll MD,  July 12, 2010 4:52 PM    Additional Follow-up for Phone Call Additional follow up Details #2::    Another phone note is on hold will sign off on this one. Zackery Barefoot CMA  July 13, 2010 4:15 PM

## 2010-07-22 NOTE — Progress Notes (Signed)
Summary: Referral and lab results-pt returned call  Phone Note Call from Patient Call back at [phone]   Caller: Patient Reason for Call: Lab or Test Results Summary of Call: Patient wants call back regarding referral and to get lab results. Initial call taken by: Leonette Monarch,  July 13, 2010 11:16 AM  Follow-up for Phone Call        Spoke with [facility] lab and the only thing they still have pending is the HIV Genotyping and that cantake up to 10 days.  We did not order a Cd4 with ou labs.  A CD4 was ordered by Dr Daiva Eves on 07-07-10.  Please advise. Abigail Miyamoto RN  July 13, 2010 1:26 PM   Additional Follow-up for Phone Call Additional follow up Details #1::        PL obtain labs from [facility] - I have requested ID MD to see him soon & give him results. His PET scan was refused by [facility] - I spoke to them & got this approved  [phone] Good thru ' march 23 Additional Follow-up by: Comer Locket. Vassie Loll MD,  July 13, 2010 2:11 PM    Additional Follow-up for Phone Call Additional follow up Details #2::    I have notified [facility] of approval for PET scan. I will forward message to Shanda Bumps R to follow-up on lab results in a few days because they are not ready yet per [facility]. Carron Curie CMA  July 13, 2010 2:37 PM  call pt at work # asap "next few minutes per our conversation" at [phone]. Tivis Ringer, CNA  July 13, 2010 5:16 PM  Spoke with pt and assured him we are not "neglecting" him. Pt c/o loss of appetite. I advised pt he will need to eat and hydrate. I also advised pt of results that we do have and that we will call him as soon as we get the rest of his records. Pt does not have an appointment with ID NP until the end of the month for lab work. Wants to know since RA d/w ID MD can this step be skipped. I advised pt once we collect all of the test that have been ordered we will then call and ask of same. Pt is upset with the ID clinic and states he will go  to [facility] or [facility] the process does not get better. Will place this phone note on hold in my "box" until all labs are received. Zackery Barefoot CMA  July 13, 2010 5:32 PM   Additional Follow-up for Phone Call Additional follow up Details #3:: Details for Additional Follow-up Action Taken: Pt has appointment with ID 07/16/10 at 10am. Zackery Barefoot CMA  July 15, 2010 2:50 PM   I am sorry about this I asked via flag to have him seen within 2 wks of when Dr. Vassie Loll made the request. As I was not the clinic MD and had no free appts I aske him to be referred to our ID clnic md of month but somehowe this did not happen (I shouldve communicated with RN staff verbally) IN any case we will get him in and onto antivirals Additional Follow-up by: Acey Lav MD,  July 15, 2010 10:42 PM   Appended Document: Referral and lab results-pt returned call spoke with pt, admitted to [facility] [phone]

## 2010-07-22 NOTE — Initial Assessments (Signed)
INTERNAL MEDICINE ADMISSION HISTORY AND PHYSICAL  PCP: None  1st contact [place] [phone] 2nd contact Dr Gilford Rile [phone] Holidays or 5pm on weekdays:  1st contact [phone] 2nd contact [phone]  CC: SOB  HPI: 45 year old male recently diagnosed with HIV this month. Patient states that he has been experiencing fevers, night sweats, fatigue, and shortness of breath for the past 2 to 3 months. This past week his appetite has declined. Today he had dyspnea while taking a shower. He has had right-sided chest pain located along his axillary line while laying down at night and when first waking up in the morning. He states that he only coughs when he first gets up in the morning and it is occasionally productive of yellow sputum. Patient also states that for the past several months he has noticed "clots of blood and mucus in the toilet" when he has a bowel movement and has had pain with defecation. He states that he has not had diarrhea or constipation and typically has one bowel movement a day. He has continued to work at his job until today.  His risk factors for HIV include MSM. He had  been seen on several occasions by Cyril Mourning who had treated him with antibacterial antibiotics without success. Finally patient was found to be HIV positive. He had CT scans showing confluent ares in his upper lobe in right uppe lobes and he underwent a bronchoscopy by Dr. Vassie Loll who found normal appearing airways but an endobronchial lesion. He sent BAL which was negative for PCP ag, negative for bacterial pathogens, and smear was negative for AFB and fungi. He biopsy showed areas of atypia suspicous for malignancy but were not conclusive. His HIV VL was over 136k ,his cd4 was not sent due to mix up but undoubtedly it is very very low. He was seen by Dr. Algis Liming in clinic today. Revieiwn his history he had a male lover who was diagnosed with HIV in the 74s who diagnosed in 63 with whom he had sex unprotected prior to  the diagnosis. The pts partner was highly rx experienced including with sustiva and combivir later [place]. They did have some unrprotected intercourse aferwards as well. The patient's partner died in 1999-10-18. Pt himself was never tested until dr. Vassie Loll tested him at [facility] because he was afraid.     ALLERGIES: ! SULFA: Patient states he developed a rash, shortness of breath, and was taken to the ER because of this allergy.   PAST MEDICAL HISTORY: HEADACHE, CHRONIC (ICD-784.0) ALLERGIC RHINITIS (ICD-477.9)   MEDICATIONS: CELEXA 40 MG TABS (CITALOPRAM HYDROBROMIDE) Take 1 tablet by mouth once a day FLONASE 50 MCG/ACT SUSP (FLUTICASONE PROPIONATE) 2 sprays each nostril once daily PRAMOSONE 1-2.5 % LOTN (PRAMOXINE-HC) as needed AVELOX 400 MG TABS (MOXIFLOXACIN HCL) once daily -- finished course on 07/15/10   SOCIAL HISTORY: Marital Status: Single, lives with roommate Children: no Occupation:Project Analyst  Former smoker age [age]  ~1PPD  Gay -  Birds -2 birds (cockateals)   FAMILY HISTORY: Family History Emphysema -father Clotting disorders-father Family History Lung Cancer-paternal grandfather Family History C V A / Stroke -father   ROS: As per HPI, all other systems reviewed and negative.  VITALS: BMI:     27.03 Temp:     99.7 degrees F (37.61 degrees C) oral Pulse rate:   106 / minute BP sitting:   110 / 74  (right arm)   PHYSICAL EXAM: General:  alert and pale. saturating well on RA.  Head:  normocephalic and atraumatic.   Eyes:  vision grossly intact, pupils equal, pupils round, and pupils reactive to light.   Ears:  no external deformities.   Nose:  no external deformity, no external erythema, and no nasal discharge.   Mouth:  good dentition and pharynx pink and moist.  he has lightly colored ridge of tongue bilaterally but no obvious thrush Neck:  supple and full ROM. no lymphadenopathy. Lungs:  normal respiratory effort, no crackles, and no wheezes.   Heart:  regular  rate and rhythm, no murmur, no gallop.  Abdomen:  soft, normal bowel sounds, and no distention. no HSM. non-tender except mild tenderness to palpation upon inhalation in RUQ.  Msk:  normal ROM and no joint tenderness.   Neurologic:  alert & oriented X3, strength normal in all extremities Rectal: No external hemrrhoids, no palpable internal hemrrhoids, 1.5cm circular lesion superior to anus which is grey in color and appears friable.  Skin:  turgor normal and no rashes.   Psych:  Oriented X3, memory intact for recent and remote.  LABS: FOBT Negative x 1   ASSESSMENT AND PLAN:  AIDS: Recently diagnoses, however he may have been a carrier since the early 1990's - Start Atripla - Start PCP and toxo prophylaxis with Atovaquone 1500mg  once daily with meals - Recheck CD4 count - Genotype virus as patient may have resistant virus given likely exposure from previous partner who was on many medications.   MASS, LUNG:  - Patient was scheduled to have a PET scan on Monday at [facility]. Will perhaps let him go via ambulance if he is still an inpatient at that time.     FEVER, HX OF: He has had fever with night sweats for several months. Possibly due to an opportunistic infection.  - blood cultures x 2 - CXR 2 view - PJP Sputum - serum LDH - CMP, CBC - AFB blood cultures - cryptococcal antigen, urine histoplasma ag, urine pneumonococcal antigen, urine legionella antigen   VTE Proph:  SCD   ATTENDING: I performed and/or observed a history and physical examination of the patient.  I discussed the case with the residents as noted and reviewed the residents' notes.  I agree with the findings and plan--please refer to the attending physician note for more details.  Signature________________________________  Printed Name_____________________________

## 2010-07-22 NOTE — Assessment & Plan Note (Addendum)
Summary: New 042 intake on 07-21-10   Vital Signs:  Patient profile:   45 year old male Height:      69 inches (175.26 cm) Weight:      182.4 pounds (82.91 kg) BMI:     27.03 Temp:     99.7 degrees F (37.61 degrees C) oral Pulse rate:   106 / minute BP sitting:   110 / 74  (right arm)  Vitals Entered By: Wendall Mola CMA Duncan Dull) (July 16, 2010 10:43 AM) CC: new pt., c/o fevers x 1 week, episodes of sweating, back pain and right upper chest pain when coughing Is Patient Diabetic? No Pain Assessment Patient in pain? yes     Location: back Intensity: 5 Type: aching Onset of pain  Constant Nutritional Status BMI of 25 - 29 = overweight Nutritional Status Detail appetite "horrible"  Have you ever been in a relationship where you felt threatened, hurt or afraid?No   Does patient need assistance? Functional Status Self care Ambulation Normal Comments no missed doses of meds per pt.   Visit Type:  Consult Referring Provider:  pcp Primary Provider:  Dr. Shela Commons. [facility]  CC:  new pt., c/o fevers x 1 week, episodes of sweating, and back pain and right upper chest pain when coughing.  History of Present Illness: 45 year old recently with diagnosed with HIV. His risk factors for HIV include MSM. He had  been seen on several occasions by Cyril Mourning who had treated him with antibacterial antibiotics without success. Finally patient was found to be HIV positive. He had CT scans showing confluent ares in his upper lobe in right uppe lobes and he underwent a bronchoscopy by Dr. Vassie Loll who found normal appearing airways but an endobronchial lesion. He sent BAL which was negative for PCP ag, negative for bacterial pathogens, and smear was negative for AFB and fungi. He biopsy showed areas of atypia suspicous for malignancy but were not conclusive. His HIV VL was over 136k ,his cd4 was not sent due to mix up but undoubtedly it is very very low. I worked him into clinic today. Upon  arrival he was conplaining of fevers and feeling more fatigued, dyspnic especially while taking a shower today. He immediately laid down on the exam table. Revieiwn his history he had a male lover who was diagnosed with HIV in the 36s who diagnosed in 81 with whom he had sex unprotected prior to the diagnosis. The pts partner was highly rx experienced including with sustiva and combivir later [place]. They did have some unrprotected intercourse aferwards as well. The patient s partner died iin 59. Pt himself was never tested until dr. Vassie Loll tested him at [facility].  I have spent well over an hour with this pt including greater than 50% of time counselling the pt face to face and in coordination of care.  Preventive Screening-Counseling & Management  Alcohol-Tobacco     Alcohol drinks/day: occ     Smoking Status: quit     Packs/Day: 1.5     Year Started: 1985     Year Quit: 2006  Caffeine-Diet-Exercise     Caffeine use/day: coffee, soda 4 per day     Does Patient Exercise: no  Safety-Violence-Falls     Seat Belt Use: yes      Sexual History:  none.        Drug Use:  never.    Comments: pt. declined condoms  Problems Prior to Update: 1)  Mass, Lung  (ICD-786.6)  2)  HIV Infection  (ICD-042) 3)  Bacterial Pneumonia  (ICD-482.9) 4)  Headache, Chronic  (ICD-784.0) 5)  Allergic Rhinitis  (ICD-477.9)  Medications Prior to Update: 1)  Celexa 40 Mg Tabs (Citalopram Hydrobromide) .... Take 1 Tablet By Mouth Once A Day 2)  Flonase 50 Mcg/act Susp (Fluticasone Propionate) .... 2 Sprays Each Nostril Once Daily 3)  Pramosone 1-2.5 % Lotn (Pramoxine-Hc) .... As Needed 4)  Avelox 400 Mg Tabs (Moxifloxacin Hcl) .... Once Daily  Current Medications (verified): 1)  Celexa 40 Mg Tabs (Citalopram Hydrobromide) .... Take 1 Tablet By Mouth Once A Day 2)  Flonase 50 Mcg/act Susp (Fluticasone Propionate) .... 2 Sprays Each Nostril Once Daily 3)  Pramosone 1-2.5 % Lotn (Pramoxine-Hc) .... As  Needed  Allergies: 1)  ! Sulfa  Past History:  Past Medical History: Last updated: 04/27/2010 HEADACHE, CHRONIC (ICD-784.0) ALLERGIC RHINITIS (ICD-477.9)  Past Surgical History: Last updated: 04/27/2010 Monteggias Fracture surgery- left arm 2010 Sinus Surgery-2009  Family History: Last updated: 04/27/2010 Family History Emphysema -father Clotting disorders-father Family History Lung Cancer-paternal grandfather Family History C V A / Stroke -father  Social History: Last updated: 06/11/2010 Marital Status: Single, lives with roommate Children: no Occupation:Project Analyst  Former smoker age [age]  ~1PPD  Gay -  Birds -2 birds (cockateals)   Risk Factors: Alcohol Use: occ (07/16/2010) Caffeine Use: coffee, soda 4 per day (07/16/2010) Exercise: no (07/16/2010)  Risk Factors: Smoking Status: quit (07/16/2010) Packs/Day: 1.5 (07/16/2010)  Social History: Drug Use:  never Sexual History:  none  Review of Systems       see HPI also pertinent for depressive ssx otherwise negative on 12 pt revie  Physical Exam  General:  alert and pale.   Head:  normocephalic and atraumatic.   Eyes:  vision grossly intact, pupils equal, pupils round, and pupils reactive to light.   Ears:  no external deformities.   Nose:  no external deformity, no external erythema, and no nasal discharge.   Mouth:  good dentition and pharynx pink and moist.  he has lightly colored ridge of tongue bilaterally but no obvious thrush Neck:  supple and full ROM.   Lungs:  normal respiratory effort, no crackles, and no wheezes.   Heart:  regular rhythm, no murmur, no gallop, and tachycardia.   Abdomen:  soft, non-tender, normal bowel sounds, and no distention.   Msk:  normal ROM and no joint tenderness.   Neurologic:  alert & oriented X3, strength normal in all extremities, and gait normal.   Skin:  turgor normal and no rashes.   Psych:  Oriented X3, memory intact for recent and remote, and  dysphoric affect.     Impression & Recommendations:  Problem # 1:  AIDS (ICD-042) Assessment New  The pt undoubtedly has AIDS. I am concerend about his deteriorrating status and will admit him to the hospital (see below). I will plan on starting him on atripla tongiht in the hospital keeping in mind he may have resistant virus from his partrner that genotype may disclose and we can alter theapy later if needed. He is going need pcp prophylaxis and possibley MAI prophylaxis. We are going to work him up first The following medications were removed from the medication list:    Avelox 400 Mg Tabs (Moxifloxacin hcl) ..... Once daily  Orders: Consultation Level V (16109)  Problem # 2:  MASS, LUNG (ICD-786.6)  He is probably going to need a VATS to make the diagnosis since the TBB was inconclusive. I  will contact Dr. Vassie Loll  Orders: Consultation Level V [id number])  Problem # 3:  FEVER, HX OF (ICD-V15.9)  We will admit to teaching service get CXR 2 view, sputa for PCP, check serum LDH, cryptococcal ag, cmp, cbc, ABG,  CD4 count blood cultures x 2, AFB blood cultures, urine histoplasma ag, urine pneumococcal ag, urine legionella ag. We can consider recheck CT scan.  could consider startig him on rx for PCP empirically. This could all be progression of his lung process that could be cancer rather than infection but it is also possible that wihout pcp prophylaxis that he now has pcp pna  Orders: Consultation Level V (60454)  Other Orders: Pneumococcal Vaccine (09811) Admin 1st Vaccine (91478) Hepatitis B Vaccine >9yrs (29562) Admin of Any Addtl Vaccine (13086)    Orders Added: 1)  Pneumococcal Vaccine [90732] 2)  Admin 1st Vaccine [90471] 3)  Hepatitis B Vaccine >34yrs [90746] 4)  Admin of Any Addtl Vaccine [90472] 5)  Consultation Level V [57846]   Immunizations Administered:  Pneumonia Vaccine:    Vaccine Type: Pneumovax    Site: right deltoid    Mfr: Merck    Dose: 0.5 ml     Route: IM    Given by: Wendall Mola CMA ( AAMA)    Exp. Date: 12/31/2011    Lot #: [id number]    VIS given: 05/11/09 version given July 16, 2010.  Hepatitis B Vaccine # 1:    Vaccine Type: HepB Adult    Site: left deltoid    Mfr: Merck    Dose: 0.5 ml    Route: IM    Given by: Wendall Mola CMA ( AAMA)    Exp. Date: 03/09/2012    Lot #: [id number]    VIS given: 12/21/05 version given July 16, 2010.  PPD Results    Date of reading: 06/14/2010    Results: < 5mm    Interpretation: negative   Immunizations Administered:  Pneumonia Vaccine:    Vaccine Type: Pneumovax    Site: right deltoid    Mfr: Merck    Dose: 0.5 ml    Route: IM    Given by: Wendall Mola CMA ( AAMA)    Exp. Date: 12/31/2011    Lot #: [id number]    VIS given: 05/11/09 version given July 16, 2010.  Hepatitis B Vaccine # 1:    Vaccine Type: HepB Adult    Site: left deltoid    Mfr: Merck    Dose: 0.5 ml    Route: IM    Given by: Wendall Mola CMA ( AAMA)    Exp. Date: 03/09/2012    Lot #: [id number]    VIS given: 12/21/05 version given July 16, 2010.

## 2010-07-23 ENCOUNTER — Other Ambulatory Visit: Payer: Self-pay | Admitting: Interventional Radiology

## 2010-07-23 ENCOUNTER — Inpatient Hospital Stay (HOSPITAL_COMMUNITY): Payer: 59

## 2010-07-23 DIAGNOSIS — R509 Fever, unspecified: Secondary | ICD-10-CM

## 2010-07-23 LAB — CULTURE, BLOOD (ROUTINE X 2): Culture: NO GROWTH

## 2010-07-23 LAB — IGG, IGA, IGM: IgG (Immunoglobin G), Serum: 1190 mg/dL (ref 694–1618)

## 2010-07-26 NOTE — Op Note (Signed)
  [name], [name]            ACCOUNT NO.:  [id number]  MEDICAL RECORD NO.:  [id number]           PATIENT TYPE:  O  LOCATION:  XRAY                         FACILITY:  [facility]  PHYSICIAN:  Marvin Younker L. [name]., M.D.DATE OF BIRTH:  Nov 03, 1965  DATE OF PROCEDURE:  07/23/2010 DATE OF DISCHARGE:                              OPERATIVE REPORT   PROCEDURE:  Colonoscopy.  MEDICATIONS:  Benadryl 25 mg, fentanyl 150 mcg, and Versed 10 mg IV.  INDICATIONS:  A 45 year old gentleman with some rectal bleeding who has had evidence of metastatic tumor.  This is done to rule out a lower GI source.  DESCRIPTION OF PROCEDURE:  Procedure explained to the patient, consent obtained.  Left lateral decubitus position.  The adult Pentax colonoscope was inserted and advanced.  We reached the cecum easily, ileocecal valve and appendiceal orifice seen.  Scope was withdrawn and the cecum, ascending, transverse, descending, and sigmoid colon seen well.  No polyps or other lesions were seen.  The scope was withdrawn in the rectum and retroflexed with the finding of internal hemorrhoids. Scope was withdrawn.  The patient tolerated the procedure well.  ASSESSMENT:  Rectal bleeding due to internal hemorrhoids, otherwise normal colonoscopy.  PLAN:  We will go ahead and give the patient and I would be happy to see back on an as-needed basis.          ______________________________ Llana Aliment [name]., M.D.     Waldron Session  D:  07/23/2010  T:  07/23/2010  Job:  [id number]  cc:   Acey Lav, MD  Electronically Signed by Carman Ching M.D. on 07/26/2010 03:19:25 PM

## 2010-07-26 NOTE — Consult Note (Signed)
NAMEABDIAS, [name]            ACCOUNT NO.:  [id number]  MEDICAL RECORD NO.:  [id number]           PATIENT TYPE:  O  LOCATION:  XRAY                         FACILITY:  [facility]  PHYSICIAN:  Manford Sprong L. [name]., M.D.DATE OF BIRTH:  1965/06/14  DATE OF CONSULTATION: DATE OF DISCHARGE:                                CONSULTATION   REQUESTING PHYSICIAN:  Doneen Poisson, MD.  REASON FOR CONSULTATION:  Rectal bleeding.  HISTORY:  The patient is a 45 year old white male who has recently been diagnosed with HIV disease.  For the past several months, he has had decline in appetite, vague nausea and anorexia, vague right-sided chest pain, and nonproductive cough.  He also was given to have mucousy stool with clots and streaky bright red blood without really having diarrhea or constipation.  He usually has one bowel movement a day.  Because of his recent cough, he has undergone evaluation that has included a bronchoscopy by Dr. Vassie Loll.  He had an area in his right upper lobe. There were smears done for infectious causes, which were negative, and the biopsy was suspicious for malignancy but was not conclusive.  He was admitted with vague fevers and rectal bleeding.  Since this admission, he has had a CAT scan which showed multiple hypermetabolic lesions in the chest and liver, which have been biopsied and are suspicious for malignancy.  The official path report has not yet returned.  He also had a right upper lobe mass that was hypermetabolic, consistent with neoplasm.  There were also areas in the nasopharynx and oropharynx that lit up as well.  The patient has no history of previous rectal bleeding. He has no history of GI cancers or other problems.  ALLERGIES:  SULFA.  MEDICATIONS: 1. Celexa. 2. Flonase. 3. Prednisone. 4. Avelox.  FAMILY HISTORY:  His father had lung issues and emphysema and may have had lung cancer.  There is colon cancer in the family, according to his mother  in distant relatives such as great-aunts, uncles, and cousins, but the patient is unable to give me more details.  There is cerebrovascular disease, his father has had a stroke.  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  He used to smoke, but quit a number of years ago.  PHYSICAL EXAMINATION:  VITAL SIGNS:  Unremarkable. HEENT:  The throat reveals no gross lesions. NECK:  Supple with no lymphadenopathy. LUNGS:  Clear. HEART:  Regular rate and rhythm without murmurs or gallops. ABDOMEN:  Soft and nontender.  ASSESSMENT:  Rectal bleeding.  I my suspicion is that this is probably hemorrhoids and does not represent a primary cancer.  I do think, at 35, with new onset of rectal bleeding and evidence of metastatic disease that we do need to rule out colon cancer.  I have discussed this with the patient, he is quite agreeable.  PLAN:  We will tentatively set him up for colonoscopy on Friday, 2 days from now.  I have discussed this with the patient, he is agreeable to this approach.          ______________________________ Llana Aliment.  [name]., M.D.     Waldron Session  D:  07/21/2010  T:  07/22/2010  Job:  [id number]  cc:   Doneen Poisson, MD  Electronically Signed by Carman Ching M.D. on 07/26/2010 03:19:13 PM

## 2010-07-27 ENCOUNTER — Telehealth: Payer: Self-pay | Admitting: Infectious Disease

## 2010-07-27 LAB — HIV-1 GENOTYPR PLUS

## 2010-07-28 ENCOUNTER — Other Ambulatory Visit: Payer: Self-pay | Admitting: Oncology

## 2010-07-28 ENCOUNTER — Encounter (HOSPITAL_BASED_OUTPATIENT_CLINIC_OR_DEPARTMENT_OTHER): Payer: 59 | Admitting: Oncology

## 2010-07-28 ENCOUNTER — Inpatient Hospital Stay (HOSPITAL_COMMUNITY)
Admission: AD | Admit: 2010-07-28 | Discharge: 2010-08-02 | DRG: 976 | Disposition: A | Discharge status: Home / Self Care | Payer: 59 | Source: Ambulatory Visit | Attending: Oncology | Admitting: Oncology

## 2010-07-28 DIAGNOSIS — R509 Fever, unspecified: Secondary | ICD-10-CM | POA: Diagnosis present

## 2010-07-28 DIAGNOSIS — C8589 Other specified types of non-Hodgkin lymphoma, extranodal and solid organ sites: Secondary | ICD-10-CM | POA: Diagnosis present

## 2010-07-28 DIAGNOSIS — B20 Human immunodeficiency virus [HIV] disease: Principal | ICD-10-CM | POA: Diagnosis present

## 2010-07-28 DIAGNOSIS — C8590 Non-Hodgkin lymphoma, unspecified, unspecified site: Secondary | ICD-10-CM

## 2010-07-28 DIAGNOSIS — F329 Major depressive disorder, single episode, unspecified: Secondary | ICD-10-CM | POA: Diagnosis present

## 2010-07-28 DIAGNOSIS — F3289 Other specified depressive episodes: Secondary | ICD-10-CM | POA: Diagnosis present

## 2010-07-28 LAB — CBC & DIFF AND RETIC
BASO%: 0.2 % (ref 0.0–2.0)
EOS%: 0.5 % (ref 0.0–7.0)
HCT: 30.5 % — ABNORMAL LOW (ref 38.4–49.9)
Immature Retic Fract: 17.3 % — ABNORMAL HIGH (ref 0.00–13.40)
LYMPH%: 10.6 % — ABNORMAL LOW (ref 14.0–49.0)
MCH: 29.5 pg (ref 27.2–33.4)
MCHC: 34.4 g/dL (ref 32.0–36.0)
MCV: 85.7 fL (ref 79.3–98.0)
MONO%: 10.1 % (ref 0.0–14.0)
NEUT%: 78.6 % — ABNORMAL HIGH (ref 39.0–75.0)
lymph#: 1.4 10*3/uL (ref 0.9–3.3)

## 2010-07-28 LAB — COMPREHENSIVE METABOLIC PANEL
ALT: 119 U/L — ABNORMAL HIGH (ref 0–53)
AST: 74 U/L — ABNORMAL HIGH (ref 0–37)
Albumin: 2.4 g/dL — ABNORMAL LOW (ref 3.5–5.2)
CO2: 23 mEq/L (ref 19–32)
Calcium: 8.7 mg/dL (ref 8.4–10.5)
Chloride: 96 mEq/L (ref 96–112)
Potassium: 4 mEq/L (ref 3.5–5.3)
Sodium: 129 mEq/L — ABNORMAL LOW (ref 135–145)
Total Protein: 7.2 g/dL (ref 6.0–8.3)

## 2010-07-28 LAB — LACTATE DEHYDROGENASE: LDH: 130 U/L (ref 94–250)

## 2010-07-28 LAB — MORPHOLOGY: PLT EST: ADEQUATE

## 2010-07-28 LAB — MISCELLANEOUS TEST: Miscellaneous Test: 2

## 2010-07-28 NOTE — Consult Note (Signed)
NAMEJARMAINE, EHRLER            ACCOUNT NO.:  [id number]  MEDICAL RECORD NO.:  [id number]           PATIENT TYPE:  I  LOCATION:  6741                         FACILITY:  [facility]  PHYSICIAN:  Pierce Crane, MD        DATE OF BIRTH:  1966/02/22  DATE OF CONSULTATION:  07/22/2010 DATE OF DISCHARGE:  07/23/2010                                CONSULTATION   REASON FOR CONSULTATION:  HIV, lymphoma.  HISTORY OF PRESENT ILLNESS:  Mr. Burcher is a 45 year old white male, recently diagnosed with HIV, currently on medications, as well as a recent history of bacterial pneumonia.  The patient was admitted on July 16, 2010, with low-grade fevers, night sweats, fatigue, shortness of breath, progressive over the last 2-3 months.  On admission, he also had right-sided chest pain, and poor p.o. intake. HIV medications were managed by Dr. Daiva Eves, and Atripla was initiated. A chest x-ray on July 17, 2010, showed a right upper lobe mass,which biopsy on July 02, 2008, had shown atypia.  A PET on July 19, 2010, showed interval progression of the multifocal lesion in the right upper lobe, with an SUV of 25.  Metastatic disease in the right hilum, central mediastinum, and hypermetabolic nodules in the left upper lobe were seen.  A 40-mm hypermetabolic lesion in the lateral right side of the liver consistent with metastases and an SUV of 9 was seen. Moreover, hypermetabolic uptake in the proximal right nasopharynx and right oropharynx with an SUV of 25 was observed.  No soft tissue lesion, was visualized.  Ultrasound-guided core biopsy of the right liver subcapsular lesion was performed by Interventional Radiology revealing malignant cells.  The immunohistochemical stains are pending.  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  We were asked to see the patient in consultation, regarding these diagnoses.  Of note, the patient has been experiencing bright red  blood per rectum, from a friable anal lesion palpated on exam by admitting physicians, which is being investigated, and the patient is due for a colonoscopy by Dr. Randa Evens.  PAST MEDICAL HISTORY: 1. Recent diagnosis of HIV, on medications.  The patient was on     Atripla, and currently, these medications have been changed. 2. Chronic migraines. 3. Allergic rhinitis. 4. History of Monteggia's fracture in the left arm, status post repair     in the year 2010. 5. History of strep pneumonia, recent. 6. Anxiety. 7. 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  SURGERY: 1. Status post left ORIF of the ulnar fracture on November 09, 2008, 2. Status post sinus surgery in 2009.  ALLERGIES:  SULFA.  MEDICATIONS: 1. Mepron.2. Dulcolax.3. Rocephin.4. Celexa.5. Truvada.6. Flonase. 7. Protonix.8. Isentress.9. Senna.10.Tylenol.11.Xanax.12.Norco.13.Ambien.  REVIEW OF SYSTEMS:  Remarkable for fevers up to 101.7, several-month history of blood in the stools, followed by [facility] physicians.  Rest of the review of systems as per HPI.  Rest of them negative.  FAMILY HISTORY:  Remarkable for father with a history of CVA and stroke, emphysema, and what he describes as clotting problems.  Paternal grandfather died with lung cancer.  He has 2  uncles who died with colon cancer.  SOCIAL HISTORY:  The patient is single, he lives with partner.  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  He quit 7 years ago the use of tobacco. She smoked one pack a day for about 21 years.  No alcohol or recreational drugs.  PHYSICAL EXAMINATION:  GENERAL:  He is a well-developed, well-nourished 45 year old white male in no acute distress, alert and oriented x3. VITAL SIGNS:  Blood pressure 103/71, pulse 91, respirations 20, temperature 98.3, O2 sats 99% in room air, weight 80 kg. HEENT:  Remarkable for some erythema in the pharynx, and a yellowish plaque in the right side of the tongue.  No thrush.   PERRLA. NECK:  Supple.  No cervical or supraclavicular masses. LUNGS:  Clear to auscultation bilaterally.  No axillary masses. CARDIOVASCULAR:  Regular rate and rhythm without murmurs, rubs, or gallops. ABDOMEN:  Soft, nontender.  Bowel sounds x4.  No hepatosplenomegaly. GU and RECTAL:  Deferred. EXTREMITIES:  No clubbing or cyanosis.  No edema.  No inguinal masses. SKIN:  Without lesions.  No petechial rash. NEURO:  Nonfocal.  LABORATORY DATA:  Hemoglobin 11.7, hematocrit 34.5, white count 8.0, platelets 298,000, MCV 89.8.  PTT 31, PT 13.7, INR 1.03.  Sodium 133, potassium 4.3, BUN 9, creatinine 0.84, glucose 86, total bilirubin 0.5, alkaline phosphatase 72, AST 27, ALT 25, total protein 7.2, albumin 3.1, calcium 8.6, LDH 137.  Crypto antibody negative.  HIV RNA is 129,000. Legionella negative.  AFB negative.  CD4 80, T helper percent 6.  Strep pneumonia in urine positive.  Hemoccult negative.  Negative PCJ.  ASSESSMENT/PLAN:  Dr. Donnie Coffin has seen and evaluated the patient.  This is a pleasant 45 year old man, employee at [employer], who presented with several-month history of night sweats, weight loss, cough, recently found to have human immunodeficiency virus positive, with elevated RNA count, low CD4.  CT shows bilateral lung nodules, liver mass, adenopathy, the liver biopsy showing suggestion of high-grade non- Hodgkin lymphoma, with final pathology pending.  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  The patient is currently on retroviral medications. Would go ahead with a bone marrow biopsy in the morning.  We will order a 2-D echocardiogram as well.  The patient may be able to go home and to be treated as an outpatient.  We will ask Dr. Cyndie Chime to assist in his care.  Thank you very much for allowing [place] the opportunity to participate in the care of Mr. Capili.     Marlowe Kays,  P.A.   ______________________________ Pierce Crane, MD    SW/MEDQ  D:  07/27/2010  T:  07/28/2010  Job:  [id number]  Electronically Signed by Marlowe Kays P.A. on 07/28/2010 08:57:48 AM Electronically Signed by Pierce Crane MD on 07/28/2010 12:05:13 PM

## 2010-07-28 NOTE — Assessment & Plan Note (Signed)
Summary: Pneumonia - hosp consult   Vital Signs:  Patient profile:   45 year old male O2 Sat:      98 % on Room air Temp:     98 degrees F Pulse rate:   86 / minute Pulse rhythm:   regular Resp:     18 per minute BP sitting:   118 / 74  O2 Flow:  Room air  Copy to:  pcp Primary Provider/Referring Provider:  Dr. Shela Commons. [facility]   History of Present Illness: 44/M ex smoker for FU of persistent RUL consolidation since oct '11 He developed head cold with sore throat in oct '11 , CXR showed a RUL pneumonia  - initially given levaquin 750 x 5ds, when consolidation persisted on CXR  ,levaquin 500 was given x 14 days & CT was obtained. CT chest 04/23/10 showed RUL wedge shaped consolidation with additional nodules & a peripheral LLL 6 mm nodule. There was a 2.0 x 1.3 cm right hilar LN.  He was able to work through this illness. Labs reviewed - WC nml, nml renal & liver fn Pt is homosexual with several partners in past, one of which passed of HIV. He has never had a HIV test. He also has a friend being treated for TB exposure that he has visited recently. He does have birds in the house for years.   PPD was neg.  HIV pos.  July 07, 2010  RPt Ct showed persistent consolidation & new LUL 7 mm nodule. Bronchoscopy showed whitish endobronchial lesion in apical subsegment of RUL - brushings & TBBX >> markedly atypical cells s/o lymphoproliferative process, special stains neg, afb/ fungal neg, resp cx - MSSA. Treated with avelox x 10 ds  July 19, 2010  Admitted by Dr Daiva Eves due to fevers & gen weakness, CXR showed worsening consolidation. Febrile 101.7 over weekend, Urine strep Ag pos >> treated with ceftx --> defervesced, feels better CD4 =80, denies cough, c/o occ nosebleeds PET reviewed   Allergies: 1)  ! Sulfa  Past History:  Past Medical History: Last updated: 04/27/2010 HEADACHE, CHRONIC (ICD-784.0) ALLERGIC RHINITIS (ICD-477.9)  Past Surgical History: Last  updated: 04/27/2010 Monteggias Fracture surgery- left arm 2010 Sinus Surgery-2009  Family History: Last updated: 04/27/2010 Family History Emphysema -father Clotting disorders-father Family History Lung Cancer-paternal grandfather Family History C V A / Stroke -father  Social History: Last updated: 06/11/2010 Marital Status: Single, lives with roommate Children: no Occupation:Project Analyst  Former smoker age [age]  ~1PPD  Gay -  Birds -2 birds (cockateals)   Risk Factors: Alcohol Use: occ (07/16/2010) Caffeine Use: coffee, soda 4 per day (07/16/2010) Exercise: no (07/16/2010)  Risk Factors: Smoking Status: quit (07/16/2010) Packs/Day: 1.5 (07/16/2010)  Review of Systems       The patient complains of fever.  The patient denies anorexia, weight loss, weight gain, vision loss, decreased hearing, hoarseness, chest pain, syncope, dyspnea on exertion, peripheral edema, prolonged cough, headaches, hemoptysis, abdominal pain, melena, hematochezia, severe indigestion/heartburn, hematuria, muscle weakness, suspicious skin lesions, difficulty walking, depression, unusual weight change, abnormal bleeding, enlarged lymph nodes, and angioedema.    Physical Exam  Additional Exam:  wt 194 April 27, 2010 > 191 June 11, 2010  Gen. 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) Unclear significance of pos stre p Ag - whether this represents old process or new OK to treat with ceftriaxone Due to concern for underlying  lymphoproliferative process, reviewed PEt scan - Suggest ENT input for inspection/ biopsy of nasophryngeal mass if any corresponding to PET If not, then IR guided biopsy of RUL consolidation - if this is not diagnostic m ay have to pursue wedge resection of RUL after all - This was the course discusssed in multidisciplinary conference Discussed this plan with the patient  Problem # 2:  AIDS (ICD-042) Defer to ID team      Imaging Exam  Procedure date:  07/19/2010  Findings:      IMPRESSION: Interval progression of the multifocal mass lesion in the right upper lobe which is markedly hypermetabolic, consistent with neoplasm.  There is metastatic disease in the right hilum and central mediastinum with hypermetabolic nodules in the left upper lobe, also compatible with metastatic involvement.   14 mm hypermetabolic lesion in the lateral right liver consistent with metastatic disease.   Hypermetabolic uptake is seen in the posterior right nasopharynx and right aspect of the oropharynx.  While there is some soft tissue fullness in the right posterior nasopharynx, no soft tissue lesion can be identified in the oropharynx on today's study.  Given the high level of uptake at these locations, direct visualization may be indicated to exclude an underlying neoplastic process.

## 2010-07-29 ENCOUNTER — Other Ambulatory Visit: Payer: Self-pay | Admitting: Diagnostic Radiology

## 2010-07-29 ENCOUNTER — Inpatient Hospital Stay (HOSPITAL_COMMUNITY): Payer: 59

## 2010-07-29 LAB — CBC
MCH: 28.9 pg (ref 26.0–34.0)
MCHC: 32.6 g/dL (ref 30.0–36.0)
MCV: 88.6 fL (ref 78.0–100.0)
Platelets: 318 10*3/uL (ref 150–400)
RBC: 3.6 MIL/uL — ABNORMAL LOW (ref 4.22–5.81)

## 2010-07-29 LAB — COMPREHENSIVE METABOLIC PANEL
ALT: 95 U/L — ABNORMAL HIGH (ref 0–53)
Alkaline Phosphatase: 180 U/L — ABNORMAL HIGH (ref 39–117)
BUN: 10 mg/dL (ref 6–23)
CO2: 25 mEq/L (ref 19–32)
Calcium: 8.9 mg/dL (ref 8.4–10.5)
GFR calc non Af Amer: >60 mL/min (ref >60)
Glucose, Bld: 147 mg/dL — ABNORMAL HIGH (ref 70–99)
Potassium: 5.1 mEq/L (ref 3.5–5.1)
Sodium: 137 mEq/L (ref 135–145)
Total Protein: 7.1 g/dL (ref 6.0–8.3)

## 2010-07-29 LAB — DIFFERENTIAL
Basophils Absolute: 0 10*3/uL (ref 0.0–0.1)
Lymphs Abs: 0.9 10*3/uL (ref 0.7–4.0)
Monocytes Absolute: 0.4 10*3/uL (ref 0.1–1.0)
Neutro Abs: 9.9 10*3/uL — ABNORMAL HIGH (ref 1.7–7.7)

## 2010-07-29 LAB — BETA 2 MICROGLOBULIN, SERUM: Beta-2 Microglobulin: 4.04 mg/L — ABNORMAL HIGH (ref 1.01–1.73)

## 2010-07-29 LAB — EPSTEIN-BARR VIRUS VCA, IGM: EBV VCA IgM: 0.09 {ISR}

## 2010-07-29 LAB — CSF CELL COUNT WITH DIFFERENTIAL: Tube #: 2

## 2010-07-29 LAB — CRYPTOCOCCAL ANTIGEN, CSF: Crypto Ag: NEGATIVE

## 2010-07-29 LAB — EPSTEIN-BARR VIRUS VCA, IGG: EBV VCA IgG: 2.99 {ISR} — ABNORMAL HIGH

## 2010-07-29 LAB — LACTATE DEHYDROGENASE: LDH: 132 U/L (ref 94–250)

## 2010-07-29 LAB — PROTEIN AND GLUCOSE, CSF
Glucose, CSF: 66 mg/dL (ref 43–76)
Total  Protein, CSF: 40 mg/dL (ref 15–45)

## 2010-07-29 LAB — HEPATITIS PANEL, ACUTE: Hepatitis B Surface Ag: NEGATIVE

## 2010-07-29 LAB — LACTATE DEHYDROGENASE, PLEURAL OR PERITONEAL FLUID: LD, Fluid: 13 U/L (ref 3–23)

## 2010-07-29 LAB — SEDIMENTATION RATE: Sed Rate: 133 mm/hr — ABNORMAL HIGH (ref 0–16)

## 2010-07-29 LAB — PROTIME-INR: Prothrombin Time: 14.7 seconds (ref 11.6–15.2)

## 2010-07-29 NOTE — H&P (Signed)
NAMECARNEL, STEGMAN            ACCOUNT NO.:  [id number]  MEDICAL RECORD NO.:  [id number]           PATIENT TYPE:  I  LOCATION:  1336                         FACILITY:  [facility]  PHYSICIAN:  Genene Churn. Granfortuna, M.D.DATE OF BIRTH:  11/19/1965  DATE OF ADMISSION:  07/28/2010 DATE OF DISCHARGE:                             HISTORY & PHYSICAL   CHIEF COMPLAINT:  High fever and drenching sweats in a 45 year old man just diagnosed with extensive high-grade B-cell non-Hodgkin lymphoma last week with concomitant new diagnosis of HIV/AIDS.  HISTORY OF PRESENT ILLNESS:  Mr. Mayorquin is an unfortunate 45 year old man who had been in excellent health until November 2011 when he was evaluated for a sinus infection and a cough.  A chest radiograph showed a right upper lobe infiltrate.  He was put on an initial course of oral antibiotics with Levaquin after a single injection of IM Rocephin by his primary care physician.  At the time of followup, there was no improvement in his chest x-ray. He was then put on a more extended 10- day course of additional antibiotics.  There was still no improvement in his chest radiograph.  He was referred to Dr. Vassie Loll, Pulmonary Critical Care Medicine.  He was started on Avelox, which he tolerated poorly.  A CT scan of the chest was ordered done on June 21, 2010.  This showed multiple areas of dense consolidation in the right upper lobe.  A 7 mm left upper lobe nodule noted.  No obvious adenopathy on that study.  He was admitted to [facility].  A bronchoscopy was done on July 02, 2010.  Bronchial washings, brushings, and biopsies were obtained.  An atypical white endobronchial lesion was seen in the right upper lobe bronchus.  Pathology showed markedly atypical cells with associated intense inflammation.  A lymphoproliferative process was suspected.  While in the hospital, he developed rectal bleeding.  A colonoscopy was done by Dr. Carman Ching on July 23, 2010.  The only finding was internal hemorrhoids.  An extensive laboratory panel was done.  He was found to be HIV positive.  CD4 count done on July 16, 2010, was 80 cells per cubic millimeter, HIV RNA quantitative 129,000 copies/mL with a log of 5.11.  Multiple cultures and stains from the bronchoscopy washings were negative for AFB, fungus, and bacteria including Pneumocystis.  Urine negative for legionella.  Due to suspicion of possible underlying lymphoma, a PET scan was done on July 19, 2010.  I have personally reviewed all of the images.  There was extensive uptake in the right upper lung with SUV values up to 25. Additional areas of uptake included the right nasopharynx, right oropharynx, 2 additional areas in the left upper lung, uptake in the right mediastinum, right hilar, and pericarinal lymph nodes, and a single hypermetabolic focus SUV 9 in the liver.  No other areas of uptake in the spleen, abdomen, pelvis, or bones.  He was seen in consultation by Infectious Disease and Oncology.  He was discharged from the hospital for further evaluation and treatment.  He presented to my office today for the first time.  He has been  sick since discharge with persistent high fever over 102 degrees associated with shaking chills and drenching sweats.  He is not eating or drinking.  He is admitted at this time for urgent completion of his staging evaluation and initiation of definitive treatment.  PAST MEDICAL HISTORY:  He states he had a lymph node biopsy at age 26 of a node in his left groin, but does not know the results.  He fractured his arm in a backyard accident and required surgery with placement of a steel plate in 1610.  No other surgical procedures.  He has had some sinus problems and 2 episodes of pneumonia treated as an outpatient in the past.  He had an anaphylactic reaction to Septra, used to treatment one of these sinus infections about 3  years ago.  This was successfully treated in the emergency department and he was sent home.  No prior history of hypertension, ulcers, diabetes, tuberculosis, hepatitis, yellow jaundice, mononucleosis, blood clots, bleeding problems, thyroid trouble, seizure, stroke.  He was just started on antiretroviral medication in the hospital with Truvada 1 daily.  Other current medications include, 1. Xanax 0.5 mg. 2. Tylenol 650 mg q.i.d. to suppress fever. 3. Celexa 40 mg daily, which he has been on for many years for     depression. 4. Flonase 2 sprays p.r.n. daily. 5. Mepron (atovaquone) 500 mg/5 mL, 15 mL p.o. daily with a meal as     PCP prophylaxis.  FAMILY HISTORY:  Originally from [place], parents divorced.  Father was an alcoholic.  Mother moved to [place] where he grew up.  Father died of complications of colon cancer and alcoholism.  Mother still alive and well living in the [place].  He has 4 brothers, oldest had a mild stroke and is obese, and 2 sisters, one who survived a ruptured carotid artery aneurysm.  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  Stopped smoking about 5 over 6 years ago.  No alcohol.  No recreational drugs.  No prior sexually transmitted diseases.  REVIEW OF SYSTEMS:  Mild headache when his fever goes up.  No double vision.  No blurry vision.  No sore throat.  He has developed a nonproductive cough since leaving the hospital 4 days ago.  No dyspnea, no chest pain, no pressure, no palpitations, no diarrhea.  He is still seeing some intermittent blood in his stools.  No urinary frequency or burning.  He noted a small new pustule on his left testicle over the last few days.  No paresthesias.  No rash.  No bruising.  No myalgias or arthralgias.  PHYSICAL EXAMINATION:  GENERAL:  Acutely ill-appearing Caucasian man. VITAL SIGNS:  Pulse 124 and regular, blood pressure 108/71, respirations 22, temperature 99.3. SKIN:  Pale.   He is sweating profusely.  No ecchymoses or rash. HEENT:  Pupils equal, reactive to light.  Optic disks sharp.  No hemorrhage or exudate.  Oral pharynx, no obvious mass or exudate. NECK:  Supple.  No thyromegaly.  Carotids 2+. LUNGS:  Overall clear and resonant to percussion. HEART:  Regular cardiac rhythm.  No murmur or gallop.  There is no cervical, supraclavicular, or axillary adenopathy. ABDOMEN:  Soft, mildly and diffusely tender.  No palpable mass or organomegaly. GU:  Circumcised penis, small vesicular 4 x 4 mm lesion, left testicle. EXTREMITIES:  No edema.  No calf tenderness. NEUROLOGIC:  Motor strength 5/5.  Reflexes 2+ symmetric.  Coordination, rapid alternating movements.  Finger to finger,  finger to hand, all normal.  LABORATORY DATA:  Hemoglobin 10.5, hematocrit 30.5, white count 12,900, 79 neutrophils, 11 lymphocytes, 10 monocytes, platelets 388,000.  Sodium 129, BUN 9, creatinine 0.7, random glucose 117, alkaline phosphatase 190, SGOT 74, SGPT 119, total protein 7.2, albumin 2.4, calcium 8.7, corrected calcium approximately 9.5, LDH is 138.  Additional labs from recent hospitalization, uric acid was 5.6, LDH 137. Serum immunoglobulins with elevated IgA 611, normal IgG 1190, normal IgM 92.  An echocardiogram showed a normal ejection fraction of 65%, normal wall motion.  IMPRESSION: 1. High-grade B-cell non-Hodgkin lymphoma.  Based on liver biopsy done     February 14th, which showed tissue positive for CD45 and CD45RB,     but negative for CD20 and negative for CD79a.  Negative bone marrow     biopsy, but extensive disease in lung, retropharynx, mediastinum,     and a single focus of biopsy-proven disease in the liver making him     a stage IVB.  Oddly, no signs of hypermetabolism based on normal     LDH and uric acid.  In my mind, there is a disconnect between what the pathologist is calling high-grade B-cell non-Hodgkin lymphoma and his clinical presentation.   Additional cytogenetic studies are in progress to see if he has the 8;14 translocation associated with Burkitt lymphoma.  In general, Burkitt's has pronounced signs of hypermetabolism with markedly elevated LDH and uric acid.  I would be concerned that he might have Hodgkin lymphoma.  PLAN:  Due to the oropharyngeal involvement on PET scan, he is at risk for central nervous system involvement.  He needs a lumbar puncture with cytologies and I will also check for the presence of EBV genomes by PCR. He will likely need to receive prophylactic intrathecal chemotherapy as part of his total treatment program.  I will begin hydration, allopurinol, and steroids tonight while the remainder of his evaluation is in progress.  I will ask Interventional Radiology to assist tomorrow with a lumbar puncture and then placement of a Port-a-Cath infusion device to administer the chemotherapy safely.  HIV/AIDS.  The group with the largest experience treating HIV lymphoma patients is Dr. Hurshel Keys [facility].  They routinely do not start antiretroviral medication in those patients who have not previously been on it.  They find that if they do this they have to compromise on the chemotherapy doses and therefore compromise on the ability to achieve remission.  Therefore, I am going to stop his Truvada.  I will continue the PCP prophylaxis and also add antifungal and antiviral prophylaxis.  Small vesicular lesion on the left testicle.  Probably viral.  I will start oral acyclovir and monitor closely.  May need to increase from prophylactic to therapeutic dose.     Genene Churn. Cyndie Chime, M.D.     Lottie Rater  D:  07/28/2010  T:  07/29/2010  Job:  [id number]  cc:   Oretha Milch, MD [address] [place] [address]  Doneen Poisson, MD Fax: [phone]  __________ Dr. Hurshel Keys  Electronically Signed by Cephas Darby M.D. on 07/29/2010 07:57:37 AM

## 2010-07-30 ENCOUNTER — Inpatient Hospital Stay (HOSPITAL_COMMUNITY): Payer: 59

## 2010-07-30 LAB — CBC
HCT: 31.9 % — ABNORMAL LOW (ref 39.0–52.0)
Hemoglobin: 10.3 g/dL — ABNORMAL LOW (ref 13.0–17.0)
MCH: 29.1 pg (ref 26.0–34.0)
MCHC: 32.3 g/dL (ref 30.0–36.0)

## 2010-07-30 LAB — GLUCOSE, CAPILLARY: Glucose-Capillary: 136 mg/dL — ABNORMAL HIGH (ref 70–99)

## 2010-07-30 LAB — FUNGUS CULTURE W SMEAR

## 2010-07-30 LAB — BASIC METABOLIC PANEL
CO2: 24 mEq/L (ref 19–32)
Calcium: 8.4 mg/dL (ref 8.4–10.5)
Glucose, Bld: 153 mg/dL — ABNORMAL HIGH (ref 70–99)
Sodium: 136 mEq/L (ref 135–145)

## 2010-07-31 DIAGNOSIS — B20 Human immunodeficiency virus [HIV] disease: Secondary | ICD-10-CM

## 2010-07-31 DIAGNOSIS — C8589 Other specified types of non-Hodgkin lymphoma, extranodal and solid organ sites: Secondary | ICD-10-CM

## 2010-07-31 DIAGNOSIS — R110 Nausea: Secondary | ICD-10-CM

## 2010-07-31 LAB — MAGNESIUM: Magnesium: 2.4 mg/dL (ref 1.5–2.5)

## 2010-07-31 LAB — COMPREHENSIVE METABOLIC PANEL
Alkaline Phosphatase: 124 U/L — ABNORMAL HIGH (ref 39–117)
BUN: 13 mg/dL (ref 6–23)
Calcium: 8.5 mg/dL (ref 8.4–10.5)
Creatinine, Ser: 0.62 mg/dL (ref 0.4–1.5)
Glucose, Bld: 117 mg/dL — ABNORMAL HIGH (ref 70–99)
Total Protein: 6.4 g/dL (ref 6.0–8.3)

## 2010-07-31 LAB — LACTATE DEHYDROGENASE: LDH: 108 U/L (ref 94–250)

## 2010-07-31 LAB — PHOSPHORUS: Phosphorus: 3.5 mg/dL (ref 2.3–4.6)

## 2010-08-01 ENCOUNTER — Inpatient Hospital Stay (HOSPITAL_COMMUNITY): Payer: 59

## 2010-08-01 DIAGNOSIS — C8589 Other specified types of non-Hodgkin lymphoma, extranodal and solid organ sites: Secondary | ICD-10-CM

## 2010-08-01 LAB — CSF CULTURE W GRAM STAIN: Culture: NO GROWTH

## 2010-08-01 LAB — COMPREHENSIVE METABOLIC PANEL
ALT: 52 U/L (ref 0–53)
AST: 46 U/L — ABNORMAL HIGH (ref 0–37)
Alkaline Phosphatase: 96 U/L (ref 39–117)
CO2: 26 mEq/L (ref 19–32)
GFR calc Af Amer: >60 mL/min (ref >60)
GFR calc non Af Amer: >60 mL/min (ref >60)
Glucose, Bld: 132 mg/dL — ABNORMAL HIGH (ref 70–99)
Potassium: 4 mEq/L (ref 3.5–5.1)
Sodium: 139 mEq/L (ref 135–145)
Total Protein: 5.7 g/dL — ABNORMAL LOW (ref 6.0–8.3)

## 2010-08-01 LAB — LACTATE DEHYDROGENASE: LDH: 94 U/L (ref 94–250)

## 2010-08-02 DIAGNOSIS — Z21 Asymptomatic human immunodeficiency virus [HIV] infection status: Secondary | ICD-10-CM

## 2010-08-02 DIAGNOSIS — C8589 Other specified types of non-Hodgkin lymphoma, extranodal and solid organ sites: Secondary | ICD-10-CM

## 2010-08-02 DIAGNOSIS — B20 Human immunodeficiency virus [HIV] disease: Secondary | ICD-10-CM

## 2010-08-02 LAB — DIFFERENTIAL
Basophils Relative: 0 % (ref 0–1)
Lymphs Abs: 0.3 10*3/uL — ABNORMAL LOW (ref 0.7–4.0)
Monocytes Absolute: 0.1 10*3/uL (ref 0.1–1.0)
Monocytes Relative: 2 % — ABNORMAL LOW (ref 3–12)
Neutro Abs: 3.4 10*3/uL (ref 1.7–7.7)
Neutrophils Relative %: 90 % — ABNORMAL HIGH (ref 43–77)

## 2010-08-02 LAB — PHOSPHORUS: Phosphorus: 4 mg/dL (ref 2.3–4.6)

## 2010-08-02 LAB — COMPREHENSIVE METABOLIC PANEL
AST: 42 U/L — ABNORMAL HIGH (ref 0–37)
BUN: 13 mg/dL (ref 6–23)
CO2: 25 mEq/L (ref 19–32)
Calcium: 8.4 mg/dL (ref 8.4–10.5)
Chloride: 108 mEq/L (ref 96–112)
Creatinine, Ser: 0.51 mg/dL (ref 0.4–1.5)
GFR calc Af Amer: >60 mL/min (ref >60)
GFR calc non Af Amer: >60 mL/min (ref >60)
Glucose, Bld: 135 mg/dL — ABNORMAL HIGH (ref 70–99)
Total Bilirubin: 0.7 mg/dL (ref 0.3–1.2)

## 2010-08-02 LAB — CBC
HCT: 29.9 % — ABNORMAL LOW (ref 39.0–52.0)
Hemoglobin: 9.6 g/dL — ABNORMAL LOW (ref 13.0–17.0)
MCH: 28.6 pg (ref 26.0–34.0)
MCHC: 32.1 g/dL (ref 30.0–36.0)
MCV: 89 fL (ref 78.0–100.0)
RBC: 3.36 MIL/uL — ABNORMAL LOW (ref 4.22–5.81)

## 2010-08-02 LAB — LACTATE DEHYDROGENASE: LDH: 109 U/L (ref 94–250)

## 2010-08-02 LAB — EPSTEIN BARR VRS(EBV DNA BY PCR): EBV DNA QN by PCR: 266 copies/mL — ABNORMAL HIGH (ref <200)

## 2010-08-02 LAB — MAGNESIUM: Magnesium: 2.5 mg/dL (ref 1.5–2.5)

## 2010-08-03 ENCOUNTER — Encounter: Payer: Self-pay | Admitting: Infectious Disease

## 2010-08-03 LAB — CULTURE, BLOOD (SINGLE)

## 2010-08-03 NOTE — Discharge Summary (Signed)
NAMECAEDIN, MOGAN            ACCOUNT NO.:  [id number]  MEDICAL RECORD NO.:  [id number]           PATIENT TYPE:  I  LOCATION:  1336                         FACILITY:  [facility]  PHYSICIAN:  Genene Churn. Senia Even, M.D.DATE OF BIRTH:  15-Jun-1965  DATE OF ADMISSION:  07/28/2010 DATE OF DISCHARGE:  08/02/2010                              DISCHARGE SUMMARY   HISTORY:  Mr. Hyde is a 45 year old man under evaluation for a persistent and progressive cough since November 2011.  Chest radiograph showed a right upper lobe infiltrate.  He failed to improve on outpatient antibiotics.  A CT scan was done on June 21, 2010, which showed areas of dense consolidation in the right upper lobe.  A small left upper lobe nodule was noted.  He was admitted to [facility].  Bronchoscopy done July 02, 2010, with bronchial washings, brushings and biopsies.  Pathology showed markedly atypical cells with associated intense inflammation.  A lymphoproliferative process was suspected.  Routine cultures and stains for AFB and fungus were all negative.  Pneumocystis stain negative.  Urine Legionella antigen negative.  As part of his evaluation in view of his social history, HIV was tested and found to be positive.  CD-4 count on July 16, 2010, 80 cells per cubic mm.  HIV RNA quantitative 129,000 copies log 5.1.  A PET scan done July 19, 2010, showed extensive uptake in the right upper lung, additional areas of uptake in the right nasopharynx, right oropharynx, two areas in the left upper lung, mediastinum on the right, and a single hypermetabolic focus in the liver.  He had B symptoms with drenching sweats and fevers of over 102 degrees. Suspicion at that point was that he had an underlying lymphoma.  A liver biopsy was done on July 20, 2010.  I personally reviewed the slides with the pathologist.  Findings were consistent with a high-grade B-cell non-Hodgkin's lymphoma.  CD45 and  CD45RB positive but CD20 and CD79a negative.  Bone marrow biopsies done on July 23, 2010, were negative for lymphoma.  He was discharged from the hospital to be treated as an outpatient for his lymphoma.  I first saw him on the day of current admission July 28, 2010.  He appeared toxic with profuse diaphoresis and borderline hypotension.  I felt he needed to be admitted to the hospital for immediate treatment.  PHYSICAL EXAMINATION:  GENERAL:  Acutely ill-appearing Caucasian man. VITAL SIGNS:  Blood pressure 108/71, pulse 124 regular, respirations 22, temperature 99.3.  PERTINENT PHYSICAL FINDINGS:  LUNGS:  Clear. CARDIAC:  No cardiac murmurs. LYMPHATIC:  No external lymphadenopathy in the neck, supraclavicular, axillary or inguinal regions. ABDOMEN:  No palpable mass or organomegaly in the abdomen, but the abdomen was mildly and diffusely tender. GENITOURINARY:  A tiny 4-mm vesicular lesion was noted on the left testicle. NEUROLOGIC:  No neurologic deficits.  HOSPITAL COURSE:  Baseline laboratory data with hemoglobin 10.5, hematocrit 30.5, white count 12,900, 79 neutrophils, 11 lymphocytes, 10 monocytes, platelets 388,000.  Sodium 129, BUN 9, creatinine 0.7, random glucose 117, alkaline phosphatase 190, SGOT 74, SGPT 119, total protein 7.2, albumin 2.4, calcium 8.7, LDH 138, uric  acid 5.6.  A recent echocardiogram done during the [facility] admission showed a normal ejection fraction of 65% with normal wall motion.  He was started immediately on parenteral steroids with Solu-Medrol 40 mg IV q.12 hours to palliate his fevers and sweats.  Other than a single low-grade temperature on the night of admission of 100.6, he became afebrile and remained so for the duration of the hospital course.  In view of the oropharyngeal involvement on the PET scan, I felt that he is at risk for central nervous system involvement with lymphoma.  With assistance of Interventional Radiology, a  lumbar puncture was done for cultures and cytologies on July 29, 2010.  Routine culture showed no growth.  Cell count was 4 total white cells.  A Cryptococcal antigen was negative.  CSF protein was normal at 40 mg percent, glucose at 66, LDH 132.  VDRL nonreactive.  EBV by PCR was elevated at 266 copies per mL, normal less than 200.  Cytology showed a "mixed lymphoid population". No malignant cells identified.  A Port-A-Cath infusion device was placed with assistance of Interventional Radiology and he began his first cycle of CHOP chemotherapy on July 30, 2010, based on height 69 inches, weight 80 kg, body surface area 2.0.  He received Cytoxan 750 mg/m2 total dose 1500 mg, Adriamycin 50 mg/m2 total dose 100 mg, and vincristine 2 mg IV. Instead of prednisone for this cycle he received Solu-Medrol 40 mg IV q.12 hours times 10 doses.  Decadron 10 mg IV and Zofran 8 mg IV used as premedication.  Zinecard 500 mg/m2 total dose 1000 mg used as a cardiac protecting agent.  He tolerated the therapy well without any acute toxicity.  He was given aggressive hydration and monitored closely for any signs of a tumor lysis syndrome.  Laboratories all remained stable.  He continued to have an intermittent cough.  He thought that this worsened while he was in the hospital.  A chest radiograph was done on February 26.  This was compared to a study done July 16, 2010. There is a persistent and slightly progressive right upper lobe consolidation with increasing cavitary changes.  New peribronchial thickening bilaterally consistent with bronchitis.  He was started on parenteral antibiotics pending the chest x-ray evaluation.  As he was otherwise clinically stable and afebrile, and since we already had an extensive evaluation of the right upper lobe infiltrate which proved to be lymphoma, I did not feel that he needed to repeat a prolonged course of IV antibiotics.  He was changed to oral Avelox  at the time of discharge.  With respect to his HIV/AIDS, he had just been started on antiretroviral drugs only 1 week prior to the current admission.  These drugs were held while he received his chemotherapy.  He was seen in consultation by Dr. Paulette Blanch Dam, who wrote an extensive note and discussed the situation with me by phone.  He felt very strongly that the patient should be put back on his antiretrovirals and this was done.  There were no complications.  CONSULTATIONS: 1. Interventional Radiology. 2. Infectious Disease.  PROCEDURES: 1. Lumbar puncture. 2. Placement of Port-A-Cath infusion device. 3. Chemotherapy for lymphoma.  DISCHARGE DIAGNOSES: 1. Stage IVB high-grade B-cell non-Hodgkin's lymphoma. 2. 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 3. Chronic depression. 4. Recent rectal bleeding with negative colonoscopy July 23, 2010,     except for findings of internal hemorrhoids.  CONDITION ON DISCHARGE:  Stable at time of discharge.  He is afebrile  with temperature 97.6.  Blood pressure 137/85.  Oxygen saturation 95% to 97% on room air.  DISCHARGE INSTRUCTIONS:  He will resume regular activity and regular diet.  FOLLOWUP: 1. Follow up in my office on August 12, 2010. 2. Follow up with Dr. Daiva Eves, Infectious Disease, on August 18, 2010. 3. Port-A-Cath care will be given in my office.  MEDICATIONS: 1. Avelox 400 mg daily. 2. Acyclovir 200 mg two p.o. b.i.d. 3. Mycelex troches 10 mg dissolve in mouth one 4 times daily. 4. Allopurinol 300 mg one daily x 21 days, then stop. 5. Compazine 10 mg q.6 hours p.r.n. nausea. 6. Zofran oral dissolving tablet 4 mg one to two sublingual q.8 hours     p.r.n. nausea or vomiting not controlled by Compazine. 7. Guaifenesin/DM 100/10 mg per 5 mL, 10 mL p.o. q.4 hours p.r.n.     cough. 8. Truvada 200/300 mg one daily. 9. Isentress 400 mg one b.i.d. p.o. 10.Flonase two sprays daily p.r.n. for  allergic rhinitis. 11.Tylenol Extra Strength 500 mg one to two q.6 hours p.r.n. fever. 12.Xanax 0.5 mg q.6 hours p.r.n. anxiety. 13.Mepron 750 mg per 5 mL, 10 mL equals 1500 mg daily with a meal as     PCP prophylaxis (the patient is penicillin allergic). 14.Celexa 40 mg one daily.     Genene Churn. Cyndie Chime, M.D.     Lottie Rater  D:  08/02/2010  T:  08/02/2010  Job:  [id number]  cc:   Acey Lav, MD Fax: [phone]  Oretha Milch, MD [address] [place] [address]  Doneen Poisson, MD Fax: [phone]  Electronically Signed by Cephas Darby M.D. on 08/03/2010 09:34:37 AM

## 2010-08-03 NOTE — Progress Notes (Signed)
  Phone Note Call from Patient   Caller: Patient Summary of Call: he was very anxoius about his fever & shakes. highest has been 102.7. taking tylenol. no appetite. does not want to see md. states he is seeing Dr. Cyndie Chime tomorrow. also is concerned about his short term disability . the forms came in when he was in hospital & the deadline is today. advised increased fluids-at least 1 cup per hour.  He likes OJ, water & tea. To take tylenol every 6 hours with his xanax. take tylenol even if fever is not high. call back in 24 hours to report how is doing. sooner of needed. Take disability forms to md tomorrow. He can fill them out. call the disabilty company & let them know he will be seeing his md tomorrow and will have them done then He agreed with plan & will call back if needed Initial call taken by: Golden Circle RN,  July 27, 2010 9:06 AM  Follow-up for Phone Call        Thanks Kennon Rounds! He does not SOUND good at all. He has lymphoma Follow-up by: Acey Lav MD,  July 27, 2010 6:05 PM

## 2010-08-04 ENCOUNTER — Ambulatory Visit: Payer: 59 | Admitting: Adult Health

## 2010-08-06 NOTE — Discharge Summary (Signed)
[name], [name]            ACCOUNT NO.:  [id number]  MEDICAL RECORD NO.:  [id number]           PATIENT TYPE:  I  LOCATION:  6741                         FACILITY:  [facility]  PHYSICIAN:  Doneen Poisson, MD     DATE OF BIRTH:  02-12-1966  DATE OF ADMISSION:  07/16/2010 DATE OF DISCHARGE:  07/23/2010                              DISCHARGE SUMMARY   DISCHARGE DIAGNOSES: 1. Lymphoma. 2. Acquired immune deficiency syndrome. 3. Hemorrhoids. 4. Anxiety. 5. Skin lesion near the anus.  DISCHARGE MEDICATIONS: 1. Alprazolam 0.5 mg by mouth every 6 hours as needed for anxiety. 2. Atovaquone 1500 mg by mouth daily taken with a meal. 3. Truvada 1 tablet daily. 4. Isentress 400 mg twice daily. 5. Celexa 40 mg by mouth daily. 6. Flonase 2 sprays in each nostril daily.  DISPOSITION AND FOLLOWUP: Follow up with Dr. Daiva Eves on August 23, 2010, at 11 a.m. in the Infectious Disease Clinic.  Things to consider at follow up: 1. Repeat CD4 count and viral load after starting on      antiretroviral medications. 2. Reassessment of need for prophylactic medications against      opportunistic infections. 3. Follow up on anxiety.  On discharge, he was continued on      Celexa and has been given a limited number of Xanax, given      the stressfulness of his new diagnosis. 4.  Follow up skin lesion superior to the anus, which may need biopsy.  Mr. Muench will follow up with Dr. Donnie Coffin in Oncology next week. He will be called with his appointment time at the beginning of next week. Per Dr. Renelda Loma appointment, things to follow up on: 1. Echo performed before discharge. 2. Final pathology, which is currently pending of his lymphoma.  PROCEDURES PERFORMED: 1. Colonoscopy on July 23, 2010, because of bleeding with bowel      movements.  Revealed internal hemorrhoids and was otherwise negative. 2. Bone marrow biopsy of his iliac crest performed on July 23, 2010. 3. Ultrasound-guided biopsy  of his liver, which included a fine needle      aspiration and core biopsy for pathologic diagnosis of multiple      hypermetabolic lesions in his body.  This was performed on July 20, 2010. 4. PET scan on July 19, 2010, at [facility], which demonstrated      multiple hypermetabolic lesions in his nasopharynx, liver, and both      lungs.  CONSULTATIONS: 1. Pulmonary, Dr. Cyril Mourning was consulted as he has seen Mr. Marcott     multiple times as an outpatient.  He originally ordered the     PET scan and agreed with biopsy for further workup of the etiology     of these hypermetabolic lesions. 2. Infectious Disease, this is a patient of Dr. Daiva Eves who along with     Dr. Ninetta Lights saw him in the hospital.  Per Dr. Daiva Eves he was     started on HAART and prophylaxis against opportunistic infections. 3. Oncology consulted after biopsy was obtained by Interventional Radiology.     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 Mr. Kenton will follow up with them as     an outpatient for possible initiation of chemotherapy.  BRIEF ADMITTING HISTORY AND PHYSICAL:  Mr. Dicioccio is a 45 year old man recently diagnosed with HIV, who presented to the Infectious Disease Clinic on Friday, July 16, 2010.  He stated that he had been  experiencing fever, night sweats, fatigue, and shortness of breath for  the past 2-3 months.  In the past, his appetite had declined. On the  day of admission, he had dyspnea while taking a shower.  He has had  right-sided chest pain located along his axillary line while lying down  at night and when first waking up in the morning.  He stated that he  only coughed when he first got up in the morning and that cough was  occasionally productive of yellow sputum.  He also stated that for  the past several months, he has noticed clots of blood and mucus in  the toilet when he had a bowel movement.  He also had pain with    defecation.  He stated that he has not had diarrhea or constipation  and typically he has one bowel movement a day.  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  His risk factors for HIV include MSM.  He had been seen on several occasions by Dr. Cyril Mourning, a pulmonologist who had treated him initially for pneumonia with antibiotics without success.  Finally, Dr. Vassie Loll convinced tMr. Beza to undergo an HIV test, which was positive.  Mr. Maglione had a CT scan showing confluent areas in his right upper lobe and he eventually underwent bronchoscopy on July 02, 2010.  Dr. Vassie Loll found normal appearing airways, but noticed an endobronchial lesion.  The biopsy showed atypical cells suspicious for malignancy, but not enough cells were acquired to make a conclusive diagnosis.  The bronchial alveolar lavage was sent.  Legionella, PJP, Fungus, and AFB smear were negative.  Cultures are  pending on discharge.  Bacterial gram stain was negative and culture demonstrated abundant staphylococcus aureus, which was pansensitive to many antibiotics.    On reviewing his history, Mr. Malmstrom had a male lover who was diagnosed  with HIV in the 72s, and had unprotected sex with him for many years.   His partner was highly antiretroviral treatment experienced with Sustiva,  Combivir, and Kaletra. His partner eventually died in 10/20/99 after a long  battle with AIDS and Mr. Kuhnert was never tested for HIV secondary to  fear and having watched his partner suffer for many years.  ALLERGIES:  SULFA: rash and  shortness of breath.  PAST MEDICAL HISTORY: 1. Allergic rhinitis. 2. Anxiety. 3. Sinus surgery in 10/19/2004.  PHYSICAL EXAMINATION: VITAL SIGNS:  Temperature 99.7, pulse 160, blood pressure 110/74,  respiratory rate 20, O2 sat 98% on room air.  Later spiked a temperature  to 101.7 in the evening on the day of admission. GENERAL:  Alert  and pale, sitting comfortably in bed. HEAD:  Normocephalic and atraumatic. EYES:  Pupils are equal, round, and reactive to light.  Extraocular movement intact. NOSE:  No external deformity, erythema, or nasal discharge. MOUTH:  Good dentition.  Oropharynx is clear.  No thrush. NECK:  Supple.  Full range of motion.  No lymphadenopathy. LUNGS:  Normal respiratory effort.  No crackles or wheezes.  Clear  to auscultation bilaterally. HEART:  Regular rate and rhythm.  No murmurs, rubs, or gallops. ABDOMEN:  Soft, normal bowel sounds, nondistended.  Mild tenderness to  palpation beneath the right costal margin upon inhalation. MSK:  Normal range of motion.  No joint tenderness. NEUROLOGICAL:  Alert and oriented x 3.  Strength 5/5 in all extremities. Sensation to light touch intact in all extremities. Cranial nerves II through XII intact. RECTAL:  No external hemorrhoids or palpable internal hemorrhoids were  appreciated.  1.5 cm circular flat plaque superior to anus, which is  grey in color and appears friable. PSYCH:  Alert and oriented x 3.  Memory is intact for recent and remote  events.  Makes good eye contact, is cooperative and conversive, but  appears anxious.  ADMISSION LABORATORY DATA:  Fecal occult blood was negative x 1.  White blood cell count 7.1, hemoglobin 12.2, hematocrit 36.6, MCV 90.6, platelets 280.  Sodium 135, potassium 3.8, chloride 99, bicarb 24, glucose 73, BUN 10, creatinine 0.88, total bilirubin 0.5, alkaline phosphatase 72, AST 27, ALT 25, total protein 7.2, albumin 3.1, calcium 8.6. Urinalysis negative.  LDH 137.  Strep pneumo urinary antigen positive. Flu PCR negative.  AFB blood cultures negative to date. Urine culture negative.  Legionella antigen negative.  CD4 count 80 with  6% T helper cells.  HIV-1 RNA quantification 129,000 copies per mL.   Cryptococcus antibody negative.  Histoplasma urinary antigen negative.   Blood cultures negative.  HOSPITAL COURSE  BY PROBLEMS: 1. Lymphoma.  Mr. Mcmanaman initially presented with 2-3 months of     fevers, night sweats, chills, and recently had a decreasing     appetite with some shortness of breath.  He had been treated     multiple times for pneumonia. However, the symptoms persisted      despite what was thought to be adequate treatment.  He also had a     large confluent pulmonanary lesion that did not resolve.  His      bronchoscopy was nondiagnsotic. A PET scan on July 19, 2010,     showed multiple lesions of hypermetabolic foci including the nasopharynx,      both lungs, and liver.  The hypermetabolic focus in the liver was on      the periphery and Interventional Radiology felt that with ultrasound      they could easily access the lesion for possible diagnosis.  This      ultrasound-guided biopsy was performed obtaining cores and a fine     needle aspiration.  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  Final stains and pathology are pending.     Oncology was consulted to set up outpatient follow up for potential      chemotherapy and treatment of his lymphoma.  He received a bone-marrow     biopsy and an echo as part of the initial oncologic work up.  The     diagnosis of lymphoma likely explains the symptoms that he was     experiencing over the past several months.  He will be seen by      Oncology next week as an outpatient and a plan will be made going      forward for him to receive treatment.  2. AIDS.  Mr. Penning has a recent diagnosis of HIV from January and a     CD4 count of 80 and viral load of 129,000.  It is likely that he  has been infected with HIV for many years given that he had     a long-term partner who had HIV with whom he had unprotected sex     for many years.  He did not want to be tested for many     years due to fear and watching his partner suffer and eventfully     pass away.  His partner was highly experienced to HIV      antiretrovirals meaning that Mr. Vidales could potentially be     infected with a virus that is resistant to some treatment.     He was initially started on Atripla, which he tolerated well,      while viral genotyping studies were pending.  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  At that time, he was switched to Truvada, which      is emtricitabine and tenofovir, and b.i.d. Isentress, which is      raltegravir, and he continued to tolerate these medications well.       On admission, we also started him on 1500 mg of atovaquone daily      with food for prophylaxis of PCP.  This medicine also has some      efficacy in prophylaxis against toxoplasmosis.  He had a G6PD test     ordered, which is still pending; and depending upon the results of      that test, could be transitioned to dapsone as prophylaxis.  He is      allergic to BACTRIM, so this was not an option for prophylaxis.       He tolerated the atovaquone well during tadmission.  He will follow      up with Dr. Daiva Eves.  3. Hemorrhoids.  Mr. Fernandez initially complained of rectal bleeding     with some pus during bowel movements that had been going on     for several years and had been happening more frequently     in the past few months. The volume of blood had increased in     just the past few weeks.  This was something that was very troubling     to him.  He stated that he did not think that he was     constipated, although when discussing this with him and gathering further     description of his bowel movements, it appears as though he may     have been suffering from constipation for a long time.  He     described the blood as bright red and occasionally he noted      mucus as well.  Because of this persistent complaint and the     possibility of malignancy in this man with  metastatic disease     versus lymphoma, GI was consulted and they felt that it was prudent     to do a colonoscopy to rule out any type of cancerous process.  The     colonoscopy was performed on July 23, 2010, and demonstrated     internal hemorrhoids but was otherwise negative.  The source of his     bleeding is likely internal hemorrhoids.  4. Anxiety.  Mr. Deal has been on 40 mg of citalopram for several  years following bouts of panic attacks and this medication has     worked well for him.  With the new diagnosis of AIDS and lymphoma,      he had experienced increased anxiety. This was also his first      hospitalization.  While in the hospital, he was given 0.5 mg Xanax      p.r.n. to treat his anxiety, which was effective.  He will also be      given a small script Xanax to take home to help manage his anxiety      over the next few weeks as he begins to learn more information      about his diagnosis and potentially beginning chemotherapy.  5. Skin lesion near the anus.  On initial examination, Mr. Medearis     was found to have a 1.5-cm circular flat gray plaque superior to     his anus that appeared friable in nature.  This lesion is something     that Mr. Sweetland thinks has been there for several years.  He has      never had it examined by physician.  This requires outpatient follow     up and perhaps biopsy.  DISCHARGE LABS AND VITALS:  Uric acid 5.6.  T-max 101, blood pressure 115/79, pulse 98, respiratory rate 18, O2 sat 96% on room air.   ______________________________ Darnelle Maffucci, MD   ______________________________ Doneen Poisson, MD   PT/MEDQ  D:  07/23/2010  T:  07/24/2010  Job:  [id number]  cc:   Acey Lav, MD Oretha Milch, MD Pierce Crane, MD  Electronically Signed by Darnelle Maffucci  on 07/28/2010 01:58:56 PM Electronically Signed by Doneen Poisson  on 08/06/2010 06:30:57 PM

## 2010-08-09 ENCOUNTER — Encounter: Payer: Self-pay | Admitting: Internal Medicine

## 2010-08-09 ENCOUNTER — Inpatient Hospital Stay (HOSPITAL_COMMUNITY)
Admission: EM | Admit: 2010-08-09 | Discharge: 2010-08-20 | DRG: 808 | Disposition: A | Discharge status: Home / Self Care | Payer: 59 | Attending: Oncology | Admitting: Oncology

## 2010-08-09 ENCOUNTER — Emergency Department (HOSPITAL_COMMUNITY): Payer: 59

## 2010-08-09 DIAGNOSIS — R5081 Fever presenting with conditions classified elsewhere: Secondary | ICD-10-CM | POA: Diagnosis present

## 2010-08-09 DIAGNOSIS — K602 Anal fissure, unspecified: Secondary | ICD-10-CM | POA: Diagnosis present

## 2010-08-09 DIAGNOSIS — J40 Bronchitis, not specified as acute or chronic: Secondary | ICD-10-CM | POA: Diagnosis present

## 2010-08-09 DIAGNOSIS — D61810 Antineoplastic chemotherapy induced pancytopenia: Principal | ICD-10-CM | POA: Diagnosis present

## 2010-08-09 DIAGNOSIS — E871 Hypo-osmolality and hyponatremia: Secondary | ICD-10-CM | POA: Diagnosis present

## 2010-08-09 DIAGNOSIS — K648 Other hemorrhoids: Secondary | ICD-10-CM | POA: Diagnosis present

## 2010-08-09 DIAGNOSIS — Z79899 Other long term (current) drug therapy: Secondary | ICD-10-CM

## 2010-08-09 DIAGNOSIS — K5289 Other specified noninfective gastroenteritis and colitis: Secondary | ICD-10-CM | POA: Diagnosis present

## 2010-08-09 DIAGNOSIS — C8589 Other specified types of non-Hodgkin lymphoma, extranodal and solid organ sites: Secondary | ICD-10-CM | POA: Diagnosis present

## 2010-08-09 DIAGNOSIS — K1231 Oral mucositis (ulcerative) due to antineoplastic therapy: Secondary | ICD-10-CM | POA: Diagnosis present

## 2010-08-09 DIAGNOSIS — T451X5A Adverse effect of antineoplastic and immunosuppressive drugs, initial encounter: Principal | ICD-10-CM | POA: Diagnosis present

## 2010-08-09 DIAGNOSIS — B20 Human immunodeficiency virus [HIV] disease: Secondary | ICD-10-CM | POA: Diagnosis present

## 2010-08-09 LAB — CBC
MCH: 28.8 pg (ref 26.0–34.0)
MCHC: 34.2 g/dL (ref 30.0–36.0)
MCV: 84.1 fL (ref 78.0–100.0)
Platelets: 60 10*3/uL — ABNORMAL LOW (ref 150–400)
RDW: 12.5 % (ref 11.5–15.5)
WBC: <0.4 10*3/uL — CL (ref 4.0–10.5)

## 2010-08-09 LAB — BASIC METABOLIC PANEL
BUN: 11 mg/dL (ref 6–23)
Creatinine, Ser: 0.72 mg/dL (ref 0.4–1.5)
GFR calc non Af Amer: >60 mL/min (ref >60)

## 2010-08-09 LAB — DIFFERENTIAL
Band Neutrophils: 0 % (ref 0–10)
Eosinophils Absolute: 0 10*3/uL (ref 0.0–0.7)
Lymphocytes Relative: 0 % — ABNORMAL LOW (ref 12–46)
Monocytes Absolute: 0 10*3/uL — ABNORMAL LOW (ref 0.1–1.0)
nRBC: 0 /100 WBC

## 2010-08-09 LAB — URINALYSIS, ROUTINE W REFLEX MICROSCOPIC
Bilirubin Urine: NEGATIVE
Ketones, ur: 15 mg/dL — AB
Nitrite: NEGATIVE
Urobilinogen, UA: 0.2 mg/dL (ref 0.0–1.0)

## 2010-08-09 LAB — LACTIC ACID, PLASMA: Lactic Acid, Venous: 1.2 mmol/L (ref 0.5–2.2)

## 2010-08-09 LAB — URINE MICROSCOPIC-ADD ON

## 2010-08-09 LAB — OSMOLALITY, URINE: Osmolality, Ur: 894 mOsm/kg (ref 390–1090)

## 2010-08-09 LAB — SODIUM, URINE, RANDOM: Sodium, Ur: 65 mEq/L

## 2010-08-09 LAB — CORTISOL: Cortisol, Plasma: 27.5 ug/dL

## 2010-08-10 DIAGNOSIS — R509 Fever, unspecified: Secondary | ICD-10-CM

## 2010-08-10 DIAGNOSIS — C8589 Other specified types of non-Hodgkin lymphoma, extranodal and solid organ sites: Secondary | ICD-10-CM

## 2010-08-10 DIAGNOSIS — D709 Neutropenia, unspecified: Secondary | ICD-10-CM

## 2010-08-10 DIAGNOSIS — B20 Human immunodeficiency virus [HIV] disease: Secondary | ICD-10-CM

## 2010-08-10 LAB — DIFFERENTIAL
Band Neutrophils: 0 % (ref 0–10)
Eosinophils Absolute: 0 10*3/uL (ref 0.0–0.7)
Lymphocytes Relative: 0 % — ABNORMAL LOW (ref 12–46)
Monocytes Relative: 0 % — ABNORMAL LOW (ref 3–12)
Neutrophils Relative %: 0 % — ABNORMAL LOW (ref 43–77)

## 2010-08-10 LAB — COMPREHENSIVE METABOLIC PANEL
ALT: 38 U/L (ref 0–53)
AST: 25 U/L (ref 0–37)
Albumin: 2.3 g/dL — ABNORMAL LOW (ref 3.5–5.2)
Alkaline Phosphatase: 85 U/L (ref 39–117)
BUN: 8 mg/dL (ref 6–23)
Chloride: 102 mEq/L (ref 96–112)
GFR calc Af Amer: >60 mL/min (ref >60)
Potassium: 2.8 mEq/L — ABNORMAL LOW (ref 3.5–5.1)
Sodium: 129 mEq/L — ABNORMAL LOW (ref 135–145)
Total Bilirubin: 0.6 mg/dL (ref 0.3–1.2)
Total Protein: 6.4 g/dL (ref 6.0–8.3)

## 2010-08-10 LAB — CBC
MCV: 83.5 fL (ref 78.0–100.0)
Platelets: 60 10*3/uL — ABNORMAL LOW (ref 150–400)
RBC: 2.91 MIL/uL — ABNORMAL LOW (ref 4.22–5.81)
WBC: <0.4 10*3/uL — CL (ref 4.0–10.5)

## 2010-08-10 LAB — OVA AND PARASITE EXAMINATION: Ova and parasites: NONE SEEN

## 2010-08-10 LAB — FECAL LACTOFERRIN, QUANT

## 2010-08-10 LAB — URINE CULTURE
Colony Count: NO GROWTH
Culture  Setup Time: 201203051020

## 2010-08-10 LAB — OSMOLALITY: Osmolality: 258 mOsm/kg — ABNORMAL LOW (ref 275–300)

## 2010-08-10 LAB — CLOSTRIDIUM DIFFICILE BY PCR: Toxigenic C. Difficile by PCR: NEGATIVE

## 2010-08-11 LAB — CBC
HCT: 22.8 % — ABNORMAL LOW (ref 39.0–52.0)
Hemoglobin: 7.8 g/dL — ABNORMAL LOW (ref 13.0–17.0)
MCHC: 34.2 g/dL (ref 30.0–36.0)
RDW: 12.9 % (ref 11.5–15.5)
WBC: 0.6 10*3/uL — CL (ref 4.0–10.5)

## 2010-08-11 LAB — BASIC METABOLIC PANEL
Calcium: 8.1 mg/dL — ABNORMAL LOW (ref 8.4–10.5)
GFR calc Af Amer: >60 mL/min (ref >60)
GFR calc non Af Amer: >60 mL/min (ref >60)
Glucose, Bld: 115 mg/dL — ABNORMAL HIGH (ref 70–99)
Potassium: 3.5 mEq/L (ref 3.5–5.1)
Sodium: 135 mEq/L (ref 135–145)

## 2010-08-11 LAB — OSMOLALITY: Osmolality: 258 mOsm/kg — ABNORMAL LOW (ref 275–300)

## 2010-08-12 ENCOUNTER — Inpatient Hospital Stay (HOSPITAL_COMMUNITY): Payer: 59

## 2010-08-12 LAB — BASIC METABOLIC PANEL
BUN: 2 mg/dL — ABNORMAL LOW (ref 6–23)
CO2: 22 mEq/L (ref 19–32)
Calcium: 8.6 mg/dL (ref 8.4–10.5)
GFR calc Af Amer: >60 mL/min (ref >60)
GFR calc non Af Amer: >60 mL/min (ref >60)
Glucose, Bld: 82 mg/dL (ref 70–99)
Potassium: 4.7 mEq/L (ref 3.5–5.1)
Sodium: 135 mEq/L (ref 135–145)

## 2010-08-12 LAB — CBC
MCH: 28.3 pg (ref 26.0–34.0)
Platelets: 148 10*3/uL — ABNORMAL LOW (ref 150–400)
RBC: 2.79 MIL/uL — ABNORMAL LOW (ref 4.22–5.81)
WBC: 2.5 10*3/uL — ABNORMAL LOW (ref 4.0–10.5)

## 2010-08-12 NOTE — Letter (Signed)
Summary: [place] Claims: Physicians Statement  [place] Claims: Physicians Statement   Imported By: Florinda Marker 08/04/2010 14:13:35  _____________________________________________________________________  External Attachment:    Type:   Image     Comment:   External Document

## 2010-08-12 NOTE — Consult Note (Signed)
[name], [name]            ACCOUNT NO.:  [id number]  MEDICAL RECORD NO.:  [id number]           PATIENT TYPE:  I  LOCATION:  1336                         FACILITY:  [facility]  PHYSICIAN:  Marvin Lav, MD  DATE OF BIRTH:  Sep 05, 1965  DATE OF CONSULTATION: DATE OF DISCHARGE:                                CONSULTATION   REFERRING PHYSICIAN:  Genene Churn. Cyndie Rodriguez, M.D.  REASON FOR INFECTIOUS DISEASE CONSULTATION:  Patient with HIV/AIDS and high-grade B-cell non-Hodgkin's lymphoma.  HISTORY OF PRESENT ILLNESS:  [name] is a quite pleasant 45 year old Caucasian male who I met in the Infectious Disease Clinic on Friday, February 10.  The patient had been followed by Dr. Cyril Rodriguez who had worked him up for a nonresolving pneumonia.  Dr. Vassie Rodriguez had ultimately done a bronchoscopy and found a bronchial lesion that was suspicious for malignancy.  The patient was then diagnosed in the interim with HIV and AIDS and had a CD-4 count of 80 and a high viral load above 140,000.  I saw the patient on Friday, the 10th, on an emergent basis and he was unwell and febrile and I admitted him to the hospital for further evaluation and workup.  We had sent a genotype on his virus and in the interim I started him on Atripla.  Since then, the patient was treated as an inpatient for pneumonia, although it turns out he likely did not have pneumonia.  The PET scan that was done on February 13th showed multiple areas of uptake including areas in the retropharynx and oropharynx with multiple hypermetabolic lesions also in the chest as well as a focal lesion in the lateral aspect of the right liver. Ultimately, the patient underwent a IR-guided biopsy of the liver metastasis and this disclosed pathology consistent with a high-grade B- cell lymphoma.  There was concern for Burkitt lymphoma and there is a t814 translocation that is being investigated.  Of note, during the patient's stay at [facility], his genotype ultimately discovered him to have a K103 mutation confirmed resistant to efavirenz.  I therefore changed his antiviral regimen to Truvada once daily along with twice daily Isentress.  I picked this regimen because it is highly tolerable and would cover his virus and then finally it has the added advantage of having very few drug-drug interactions with other medications including chemotherapeutic drugs.  The patient was admitted to [facility] after being seen by Dr. Cyndie Rodriguez in clinic with high fevers and malaise.  He had a lumbar puncture performed with results described below.  He was started on chemotherapy with Cytoxan, Adriamycin, vincristine, and Solu-Medrol.  Dr. Cyndie Rodriguez was concerned about the possibility of continuing antivirals in this patient based on his experience with [name]'s group at the [facility] at the [facility].  Dr. Andrey Rodriguez has 3 specific reasons for having concerns about giving antivirals to patients that he is treating with high-dose chemotherapy for aggressive lymphomas.  These will be described below and I respectfully disagree with his reasons for concern and feel that this patient should receive his antiviral therapy.  PAST MEDICAL  HISTORY: 1. 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  The patient likely acquired it from his partner who was     diagnosed with HIV in the 1990s and subsequently died. 2. Aggressive high-grade B-cell non-Hodgkin's lymphoma. 3. Migraines. 4. Allergic rhinitis. 5. History of [place] fracture in the left arm. 6. History of pneumonia with Staphylococcus aureus having been covered     on bronchoscopy and urinary streptococcal pneumococcal antigen     found on urine status post treatment.  PAST SURGICAL HISTORY: 1. Status post ORIF of ulnar fracture. 2. Status post sinus surgery in 2009.  ALLERGIES:  The patient is  allergic to SULFA.  CURRENT MEDICATIONS: 1. Acyclovir 400 mg twice daily. 2. Allopurinol. 3. Atovaquone. 4. Celexa. 5. Clotrimazole. 6. Methylprednisolone. 7. Vitamin K. 8. Tylenol. 9. Xanax. 10.Guaifenesin. 11.Dextromethorphan. 12.Anusol. 13.Maalox. 14.Ambien.  REVIEW OF SYSTEMS:  As described in the history of present illness. Otherwise, pertinent for dyspepsia and hiccups at this point in time. Also some anxiety.  Otherwise 12-point review of systems negative.  PHYSICAL EXAMINATION:  VITAL SIGNS:  Temperature maximum in the last 24 hours is 98.2, temperature minimum 97.4, temperature current 97.7, blood pressure 103/68, pulse 71, respirations 20, pulse oximetry 98% on room air. GENERAL:  A quite pleasant gentleman, in no acute distress.  He did become tearful and was anxious during some of our discussion, but he is quite pleasant and agreeable. HEENT:  Normocephalic, atraumatic.  He is slightly pale.  His pupils are equal, round and reactive to light.  Sclerae anicteric.  Oropharynx is clear. NECK:  Supple. CARDIOVASCULAR:  Slightly tachycardic.  No murmurs, gallops or rubs are heard. LUNGS:  Clear to auscultation. ABDOMEN:  Soft, nondistended. EXTREMITIES:  Without edema. SKIN:  He has a Port-A-Cath in place.  LABORATORY DATA:  Cell count differential from lumbar puncture performed on February 23 shows white blood cell count of 4 in the spinal fluid. Differential was that there were too few cells to count.  Cryptococcal antigen done on spinal fluid was negative.    Cytology on spinal fluid showed mixed lymphoid population without malignant cells being identified.  LDH on February 25 is 108, comprehensive metabolic panel sodium 137, potassium 3.9 chloride 106, bicarbonate 23, BUN and creatinine 13 and 0.62, glucose 113, bilirubin 0.6, alkaline phosphatase 124, AST and ALT are 54 and 66.  CBC with differential done yesterday white count 18.1, hemoglobin 10.3,  platelets 389.  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  Currently off antiviral therapy due to concerns by the primary oncologist and another oncologist at the [facility]. Human Immunodeficiency Virus with high-grade B-cell lymphoma:  I have reviewed this case with multiple other physicians both here at [facility] and at [facility] where I did my Infectious Disease fellowship, specifically at [facility].  I have been in contact with Dr. Elveria Royals, who is double board certified in Infectious Disease and Oncology and director of their Leukemia, Lymphoma, and Myeloma Division.  Additionally, I have been in contact with Dr. Marjo Bicker as well who is an Assistant Professor of Hematology and Oncology.  I have been in contact with Dr. Jiles Prows who is the principle investigator for the Human Immunodeficiency  Virus clinical trial group there, one of the best Human Immunodeficiency Virus doctors that I know, as well as Dr. Orlene Erm.  Finally, I have also been in contact with a very helpful fellow who is doing both a fellowship in Infectious Disease and Oncology and has a specific area of interest in Human Immunodeficiency Virus and high-grade malignancies.  I would like to address some of the concerns that both Dr. Cyndie Rodriguez has and that have been conveyed to him by Dr. Sharla Kidney about the ideas of giving antiviral therapy with a patient who has a high-grade lymphoma.  There are 3 principle reasons that Dr. Andrey Rodriguez gave for being concerned about giving antivirals to patients who had high-grade lymphomas and this is in the context of using  treatments such as [place]- EPOCH-RR, which is given at the [facility].  The first concern is that some antiviral therapies that were used in the 1990s did have drug interactions with chemotherapeutic drugs, which may have compromised the ability of the treating oncologist to achieve therapeutic levels of drugs or, on the conversely, may have led to toxicities due to either the chemotherapy drugs or the antiviral therapies themselves.  Of note, the articles citing such drug interactions involved antivirals that included protease inhibitors as well as older nucleoside reverse transcriptase inhibitors such as AZT and the more toxic didanosine and d4T.  None of these studies were using new drugs such as Truvada (tenofovir and emtricitabine) or the integrase inhibitor Isentress, which I was using in this patient.  I think it is unfair to extrapolate data using drugs such as protease inhibitors which are known to have multiple drug interactions to unrelated compounds such as an integrase inhibitor and a nucleoside reverse transcriptase inhibitor.  Similarly the nucleoside reverse transcriptase inhibitors that were used in that study are certainly much more toxic than what is used now with tenofovir and emtricitabine and have much more drug-drug interactions.  I am skeptical that Truvada would cause any problems as far as drug interactions with any chemotherapy that would be given to this patient.  Furthermore at [facility] currently the patients are being given R-EPOCH infusional regimens and the majority of these patients with Human Immunodeficiency Virus who have high-grade lymphoma that are receiving R-EPOCH are being treated effectively and safely with precisely the drug regimen that I had been giving this patient, namely Truvada and raltegravir.  In short, I think the first concern is an unfair extrapolation of data using older drugs to new drugs that we use  now and I think it is an unfair extrapolation and I do not find it to be reasonable.  The next concern relates to the possibility that some antiviral drugs might inhibit apoptosis of cells.  This again was found with one specific class of antiviral drugs, namely protease inhibitors.  There was some data that protease inhibitors might inhibit apoptosis and thus obviously might be a concern in a patient in which one was trying to achieve death of the cancerous cells.  Again though, I am not giving this patient a protease inhibitor and I do not find it reasonable to extrapolate data with protease inhibitors to integrase inhibitors and nucleoside reverse transcriptase inhibitors.  Finally, the final concern is that patients receiving chemotherapy and antiviral therapy might not be compliant with their antiviral therapy and might acquire new resistance mutations.  Certainly, I appreciate this concern very much. My patient specifically already has a K103N mutation that he  acquired from his partner and certainly I do not want him to acquire new mutations.  That being said, the regimen that I have prescribed for him is highly tolerable and I think that he is quite motivated to take it and quite anxious to take it actually and I think he would be highly adherent to it and achieve excellent virological control.  The physicians I consulted with at [facility] including Dr. Jiles Prows, Dr. Tammi Klippel, with Infectious Disease, Dr. Elveria Royals with Hematology/Oncology Infectious Disease as well as Dr. Marjo Bicker and the fellow, Aron Baba, all agreed wholeheartedly that this patient should receive antiviral therapy.  Certainly, we note that from all of the data that we have, we know that patients with Human Immunodeficiency Virus and Acquired Immunodeficiency Syndrome who have low CD-4 counts do much more poorly as a group in terms of increased risk of death, adverse  events, opportunistic infection, essentially across the board than patients in whom antiviral therapy is initiated.  These differences and risks are much more profound in patients who have low CD-4 counts and Acquired Immunodeficiency Syndrome were actually being found at higher CD-4 counts.  All of that data will also guide our current practice and I really, really strongly and passionately believe this patient should be started back on antiviral therapy in the form of Truvada and raltegravir.  Certainly, I want what is best for the patient as does everyone else who is taking care of this patient.  I do not want my initiating antiviral therapy for him to inhibit the ability for him to get effective chemotherapy.  Currently, the patient though is getting chemotherapy in [place] and is not part of a national trial, therefore, I do not see why he cannot receive antiviral therapy from me and chemotherapeutic drugs from the treating Oncology group.  Should he go to the [facility] for a shorter course of infusional therapy such as the [place]-EPOCH-RR, I do not see why the investigators there could not give some flexibility to the idea of giving antiviral therapy.  In their own clinical trials in which they were giving these infusional agents to patients antiviral therapy, they left the initiation of antiviral therapy to the discretion of the investigator actually.  They made strong recommendations that a patient coming in who was on antiviral therapy with suppressed viral load and who was tolerating the antiviral therapy should be continued.  In patients who had Human Immunodeficiency Virus and who are not on antiviral therapies and were naive to antiviral therapies they made recommendations that the patients not be started on antiviral therapies until they had finished the chemotherapy.  However, again this was left to the discretion of the investigator.  As mentioned, I have  been through each of the points of concern that Dr. Hurshel Keys has and I feel that each of them is an unfair extrapolation of data based on Human Immunodeficiency Virus therapies given during the 1990s that we do not use now generally and based on drugs that we do use such as protease inhibitors, but that I am not giving for this patient.  I will call Dr. Clelia Croft and convey my concerns to him.  I have talked to Dr. Cyndie Rodriguez. I have conveyed my concerns to him and we are in ongoing discussions via phone and email.  I have also forwarded Dr. Cyndie Rodriguez the e-mail chains of conversations from [facility] and we are on open lines  of communication.  The bottom line though is I think we all want what is best for the patient but I really think this patient needs to be on antiviral therapies and I would like to place him back on Truvada and raltegravir.  The patient also agrees with me on this point and would like to start back on his antiviral therapy.  In the interim though, I am going to ask Pharmacy to look into drug-drug interactions between Isentress, Truvada, and all of his current chemotherapeutic drugs.  Thank you for this Infectious Disease consultation.  I spent over an hour including greater than 50% of the time in face-to-face consultation with the patient and coordination of care.     Marvin Lav, MD     CV/MEDQ  D:  07/31/2010  T:  07/31/2010  Job:  [id number]  cc:   [name], M.D. Fax: [phone]  Oretha Milch, MD [address] [place] [address]  Doneen Poisson, MD Fax: [phone]  Electronically Signed by Paulette Blanch DAM MD on 08/11/2010 12:07:36 PM

## 2010-08-13 LAB — DIFFERENTIAL
Basophils Absolute: 0.2 10*3/uL — ABNORMAL HIGH (ref 0.0–0.1)
Basophils Relative: 2 % — ABNORMAL HIGH (ref 0–1)
Lymphocytes Relative: 7 % — ABNORMAL LOW (ref 12–46)
Lymphs Abs: 0.7 10*3/uL (ref 0.7–4.0)
Monocytes Relative: 17 % — ABNORMAL HIGH (ref 3–12)
Neutro Abs: 6.8 10*3/uL (ref 1.7–7.7)

## 2010-08-13 LAB — CBC
HCT: 27.6 % — ABNORMAL LOW (ref 39.0–52.0)
Hemoglobin: 9.2 g/dL — ABNORMAL LOW (ref 13.0–17.0)
MCH: 28.7 pg (ref 26.0–34.0)
MCHC: 33.3 g/dL (ref 30.0–36.0)
RDW: 13.4 % (ref 11.5–15.5)

## 2010-08-13 LAB — BASIC METABOLIC PANEL
BUN: 2 mg/dL — ABNORMAL LOW (ref 6–23)
CO2: 24 mEq/L (ref 19–32)
Calcium: 8.9 mg/dL (ref 8.4–10.5)
Creatinine, Ser: 0.66 mg/dL (ref 0.4–1.5)
GFR calc non Af Amer: >60 mL/min (ref >60)
Glucose, Bld: 144 mg/dL — ABNORMAL HIGH (ref 70–99)

## 2010-08-13 LAB — STOOL CULTURE

## 2010-08-14 DIAGNOSIS — C8589 Other specified types of non-Hodgkin lymphoma, extranodal and solid organ sites: Secondary | ICD-10-CM

## 2010-08-15 DIAGNOSIS — C8589 Other specified types of non-Hodgkin lymphoma, extranodal and solid organ sites: Secondary | ICD-10-CM

## 2010-08-15 LAB — COMPREHENSIVE METABOLIC PANEL
Albumin: 2.5 g/dL — ABNORMAL LOW (ref 3.5–5.2)
Alkaline Phosphatase: 104 U/L (ref 39–117)
BUN: 1 mg/dL — ABNORMAL LOW (ref 6–23)
Calcium: 8.8 mg/dL (ref 8.4–10.5)
Creatinine, Ser: 0.72 mg/dL (ref 0.4–1.5)
Glucose, Bld: 89 mg/dL (ref 70–99)
Total Protein: 6.4 g/dL (ref 6.0–8.3)

## 2010-08-15 LAB — CBC
Hemoglobin: 9.1 g/dL — ABNORMAL LOW (ref 13.0–17.0)
MCHC: 32.7 g/dL (ref 30.0–36.0)
RDW: 14.4 % (ref 11.5–15.5)
WBC: 14.5 10*3/uL — ABNORMAL HIGH (ref 4.0–10.5)

## 2010-08-15 LAB — CULTURE, BLOOD (ROUTINE X 2)
Culture  Setup Time: 201203050848
Culture: NO GROWTH

## 2010-08-15 LAB — MAGNESIUM: Magnesium: 2.1 mg/dL (ref 1.5–2.5)

## 2010-08-15 LAB — DIFFERENTIAL
Basophils Absolute: 0 10*3/uL (ref 0.0–0.1)
Eosinophils Relative: 0 % (ref 0–5)
Lymphs Abs: 2 10*3/uL (ref 0.7–4.0)
Monocytes Relative: 9 % (ref 3–12)
Neutrophils Relative %: 77 % (ref 43–77)

## 2010-08-15 LAB — AFB CULTURE WITH SMEAR (NOT AT ARMC)
Acid Fast Smear: NONE SEEN
Acid Fast Smear: NONE SEEN

## 2010-08-16 ENCOUNTER — Inpatient Hospital Stay (HOSPITAL_COMMUNITY): Payer: 59

## 2010-08-16 DIAGNOSIS — B20 Human immunodeficiency virus [HIV] disease: Secondary | ICD-10-CM

## 2010-08-16 DIAGNOSIS — R197 Diarrhea, unspecified: Secondary | ICD-10-CM

## 2010-08-16 DIAGNOSIS — J189 Pneumonia, unspecified organism: Secondary | ICD-10-CM

## 2010-08-16 DIAGNOSIS — C8589 Other specified types of non-Hodgkin lymphoma, extranodal and solid organ sites: Secondary | ICD-10-CM

## 2010-08-16 LAB — BASIC METABOLIC PANEL
CO2: 26 mEq/L (ref 19–32)
Chloride: 107 mEq/L (ref 96–112)
GFR calc Af Amer: >60 mL/min (ref >60)
Potassium: 4.4 mEq/L (ref 3.5–5.1)

## 2010-08-16 LAB — CBC
Hemoglobin: 8.6 g/dL — ABNORMAL LOW (ref 13.0–17.0)
MCV: 89.1 fL (ref 78.0–100.0)
Platelets: 382 10*3/uL (ref 150–400)
RBC: 3.02 MIL/uL — ABNORMAL LOW (ref 4.22–5.81)
WBC: 8.8 10*3/uL (ref 4.0–10.5)

## 2010-08-16 LAB — LACTATE DEHYDROGENASE: LDH: 186 U/L (ref 94–250)

## 2010-08-16 MED ORDER — IOHEXOL 300 MG/ML  SOLN
100.0000 mL | Freq: Once | INTRAMUSCULAR | Status: AC | PRN
  Administered 2010-08-16: 100 mL via INTRAVENOUS

## 2010-08-17 DIAGNOSIS — B20 Human immunodeficiency virus [HIV] disease: Secondary | ICD-10-CM

## 2010-08-17 DIAGNOSIS — C8589 Other specified types of non-Hodgkin lymphoma, extranodal and solid organ sites: Secondary | ICD-10-CM

## 2010-08-17 DIAGNOSIS — R197 Diarrhea, unspecified: Secondary | ICD-10-CM

## 2010-08-17 DIAGNOSIS — R509 Fever, unspecified: Secondary | ICD-10-CM

## 2010-08-18 ENCOUNTER — Other Ambulatory Visit: Payer: Self-pay | Admitting: Gastroenterology

## 2010-08-18 LAB — COMPREHENSIVE METABOLIC PANEL
ALT: 16 U/L (ref 0–53)
AST: 16 U/L (ref 0–37)
CO2: 26 mEq/L (ref 19–32)
Calcium: 9 mg/dL (ref 8.4–10.5)
Chloride: 107 mEq/L (ref 96–112)
GFR calc Af Amer: >60 mL/min (ref >60)
GFR calc non Af Amer: >60 mL/min (ref >60)
Potassium: 4.1 mEq/L (ref 3.5–5.1)
Sodium: 138 mEq/L (ref 135–145)
Total Bilirubin: 0.5 mg/dL (ref 0.3–1.2)

## 2010-08-18 LAB — DIFFERENTIAL
Eosinophils Relative: 1 % (ref 0–5)
Monocytes Relative: 15 % — ABNORMAL HIGH (ref 3–12)
Neutrophils Relative %: 67 % (ref 43–77)

## 2010-08-18 LAB — CBC
Hemoglobin: 8.4 g/dL — ABNORMAL LOW (ref 13.0–17.0)
RBC: 3 MIL/uL — ABNORMAL LOW (ref 4.22–5.81)
WBC: 4.6 10*3/uL (ref 4.0–10.5)

## 2010-08-19 LAB — GC/CHLAMYDIA PROBE AMP, GENITAL: GC Probe Amp, Genital: NEGATIVE

## 2010-08-19 LAB — GIARDIA/CRYPTOSPORIDIUM SCREEN(EIA): Giardia Screen - EIA: NEGATIVE

## 2010-08-20 LAB — DIFFERENTIAL
Eosinophils Absolute: 0 10*3/uL (ref 0.0–0.7)
Lymphocytes Relative: 17 % (ref 12–46)
Lymphs Abs: 0.6 10*3/uL — ABNORMAL LOW (ref 0.7–4.0)
Monocytes Relative: 13 % — ABNORMAL HIGH (ref 3–12)
Neutro Abs: 2.6 10*3/uL (ref 1.7–7.7)
Neutrophils Relative %: 69 % (ref 43–77)

## 2010-08-20 LAB — COMPREHENSIVE METABOLIC PANEL
AST: 19 U/L (ref 0–37)
Albumin: 2.4 g/dL — ABNORMAL LOW (ref 3.5–5.2)
Alkaline Phosphatase: 107 U/L (ref 39–117)
BUN: 3 mg/dL — ABNORMAL LOW (ref 6–23)
CO2: 24 mEq/L (ref 19–32)
Chloride: 106 mEq/L (ref 96–112)
GFR calc non Af Amer: >60 mL/min (ref >60)
Potassium: 4.1 mEq/L (ref 3.5–5.1)
Total Bilirubin: 0.5 mg/dL (ref 0.3–1.2)

## 2010-08-20 LAB — CBC
Hemoglobin: 8.6 g/dL — ABNORMAL LOW (ref 13.0–17.0)
MCV: 89.2 fL (ref 78.0–100.0)
Platelets: 362 10*3/uL (ref 150–400)
RBC: 2.97 MIL/uL — ABNORMAL LOW (ref 4.22–5.81)
WBC: 3.8 10*3/uL — ABNORMAL LOW (ref 4.0–10.5)

## 2010-08-20 LAB — HSV PCR: Specimen Source-HSVPCR: NO GROWTH

## 2010-08-20 LAB — ASPERGILLUS GALACTOMANNAN ANTIGEN: Aspergillus galactomannan Index: 0.1 (ref <0.5)

## 2010-08-20 LAB — CYTOMEGALOVIRUS PCR, QUALITATIVE: Cytomegalovirus DNA: NOT DETECTED

## 2010-08-21 LAB — MICROSPORIDIA SPORE STAIN, FECES

## 2010-08-21 LAB — CMV DNA BY PCR, QUALITATIVE: CMV DNA, Qual PCR: NOT DETECTED

## 2010-08-23 ENCOUNTER — Encounter (HOSPITAL_BASED_OUTPATIENT_CLINIC_OR_DEPARTMENT_OTHER): Payer: 59 | Admitting: Oncology

## 2010-08-23 ENCOUNTER — Other Ambulatory Visit: Payer: Self-pay | Admitting: Infectious Disease

## 2010-08-23 ENCOUNTER — Encounter: Payer: Self-pay | Admitting: Infectious Disease

## 2010-08-23 ENCOUNTER — Ambulatory Visit (INDEPENDENT_AMBULATORY_CARE_PROVIDER_SITE_OTHER): Payer: 59 | Admitting: Infectious Disease

## 2010-08-23 DIAGNOSIS — C8588 Other specified types of non-Hodgkin lymphoma, lymph nodes of multiple sites: Secondary | ICD-10-CM

## 2010-08-23 DIAGNOSIS — C8589 Other specified types of non-Hodgkin lymphoma, extranodal and solid organ sites: Secondary | ICD-10-CM

## 2010-08-23 DIAGNOSIS — Z5189 Encounter for other specified aftercare: Secondary | ICD-10-CM

## 2010-08-23 DIAGNOSIS — D702 Other drug-induced agranulocytosis: Secondary | ICD-10-CM

## 2010-08-23 DIAGNOSIS — B20 Human immunodeficiency virus [HIV] disease: Secondary | ICD-10-CM

## 2010-08-23 DIAGNOSIS — R197 Diarrhea, unspecified: Secondary | ICD-10-CM | POA: Insufficient documentation

## 2010-08-23 DIAGNOSIS — R222 Localized swelling, mass and lump, trunk: Secondary | ICD-10-CM

## 2010-08-23 DIAGNOSIS — K9089 Other intestinal malabsorption: Secondary | ICD-10-CM | POA: Insufficient documentation

## 2010-08-30 ENCOUNTER — Emergency Department (HOSPITAL_COMMUNITY)
Admission: EM | Admit: 2010-08-30 | Discharge: 2010-08-30 | Disposition: A | Discharge status: Home / Self Care | Payer: 59 | Attending: Emergency Medicine | Admitting: Emergency Medicine

## 2010-08-30 DIAGNOSIS — D649 Anemia, unspecified: Secondary | ICD-10-CM | POA: Insufficient documentation

## 2010-08-30 DIAGNOSIS — Z21 Asymptomatic human immunodeficiency virus [HIV] infection status: Secondary | ICD-10-CM | POA: Insufficient documentation

## 2010-08-30 DIAGNOSIS — R197 Diarrhea, unspecified: Secondary | ICD-10-CM | POA: Insufficient documentation

## 2010-08-30 DIAGNOSIS — D696 Thrombocytopenia, unspecified: Secondary | ICD-10-CM | POA: Insufficient documentation

## 2010-08-30 DIAGNOSIS — C8589 Other specified types of non-Hodgkin lymphoma, extranodal and solid organ sites: Secondary | ICD-10-CM | POA: Insufficient documentation

## 2010-08-30 LAB — URINALYSIS, ROUTINE W REFLEX MICROSCOPIC
Glucose, UA: NEGATIVE mg/dL
Specific Gravity, Urine: 1.011 (ref 1.005–1.030)
pH: 6.5 (ref 5.0–8.0)

## 2010-08-30 LAB — COMPREHENSIVE METABOLIC PANEL
ALT: 29 U/L (ref 0–53)
AST: 22 U/L (ref 0–37)
CO2: 27 mEq/L (ref 19–32)
Calcium: 9.2 mg/dL (ref 8.4–10.5)
GFR calc Af Amer: >60 mL/min (ref >60)
GFR calc non Af Amer: >60 mL/min (ref >60)
Sodium: 140 mEq/L (ref 135–145)

## 2010-08-30 LAB — AFB CULTURE, BLOOD

## 2010-08-30 LAB — DIFFERENTIAL
Basophils Absolute: 0.1 10*3/uL (ref 0.0–0.1)
Lymphocytes Relative: 16 % (ref 12–46)
Lymphs Abs: 1.3 10*3/uL (ref 0.7–4.0)
Monocytes Relative: 14 % — ABNORMAL HIGH (ref 3–12)
Neutrophils Relative %: 69 % (ref 43–77)

## 2010-08-30 LAB — CBC
Hemoglobin: 9.5 g/dL — ABNORMAL LOW (ref 13.0–17.0)
MCHC: 31.3 g/dL (ref 30.0–36.0)
RBC: 3.34 MIL/uL — ABNORMAL LOW (ref 4.22–5.81)

## 2010-08-30 LAB — CLOSTRIDIUM DIFFICILE BY PCR: Toxigenic C. Difficile by PCR: NEGATIVE

## 2010-08-30 LAB — FUNGUS CULTURE W SMEAR

## 2010-09-01 NOTE — Consult Note (Signed)
Marvin, Rodriguez            ACCOUNT NO.:  [id number]  MEDICAL RECORD NO.:  [id number]           PATIENT TYPE:  I  LOCATION:  1340                         FACILITY:  [facility]  PHYSICIAN:  Acey Lav, MD  DATE OF BIRTH:  [birth year]  DATE OF CONSULTATION: DATE OF DISCHARGE:                                CONSULTATION   REQUESTING PHYSICIAN:  Genene Churn. Cyndie Chime, M.D.  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  HISTORY OF PRESENT ILLNESS:  [name] is a 45 year old Caucasian male quite well known to me from this past month.  He was diagnosed HIV and AIDS and found to have an aggressive B-cell lymphoma.  His HIV was characterized by K103 mutation confirming resistance to efavirenz__ which prompted me to change in my initial regimen of Atripla to Truvada and Isentress.  He has tolerated the latter regimen quite well and has continued to be highly compliant with it throughout his chemotherapy. He initially had these antivirals held in the context of chemotherapy and this has already been extensively discussed in prior notes.  In any case, he was restarted on antiviral therapy during his last admission during which time he also had CHOP chemotherapy.  His admission at that time was notable for some worsening of persistent cough as well as increasing evidence of cavitation in his lungs.  This was thought to be more likely cavitary process due to destruction of tumor in the setting of chemotherapy.  In any case, he ultimately was discharged home on August 02, 2010.  He was readmitted on August 09, 2010, with fevers, nausea, vomiting, and profuse diarrhea with more than 7 episodes of watery diarrhea per day.  He denied having bright red blood per rectum or melanotic stools.  Of note, the  patient had had prior complaints of blood with bowel movements but these were typically when he was constipated and he did undergo a colonoscopy on July 23, 2010, performed Dr. Randa Evens.  This showed a normal cecum and ascending, transverse, and descending sigmoid colon with no polyps or other lesions, and there were just a few internal hemorrhoids found.  No biopsies were done at that time.  On this admission, [name] was initially found to be neutropenic and he was placed on broad-spectrum antibiotics including vancomycin, cefepime, and azithromycin.  He had profuse diarrhea and was placed on metronidazole as well initially.  His fevers actually responded quite promptly to institution of broad antimicrobial therapy but his loose stools have persisted.  He has had a C diff by PCR sent and this has been negative.  He failed to improve on oral metronidazole and then he had oral vancomycin added to this, and he was continued on this until earlier today.  This made no difference in his symptoms as well.  He has also been given antidiarrheal agents in the form of Lomotil.  He has been given Questran as well as Florastor. He continues to have at least 5 loose bowel movements per day.  These are nonbloody and they  have sandy composition to them.  He underwent CT scan of the chest, abdomen, and pelvis yesterday.  This showed that he had improvement in his right upper lobe airspace consolidation compared to his scan on July 19, 2010, with near resolution of this area.  He had some newer predominant lower lobe peribronchial nodules that were seen as well as a left apical nodule less prominent, that was seen on CT scan.  He has some low attenuation lesions in the hepatic lobe.  As mentioned, this is where his tumor is present.  His spleen was mildly enlarged as well.  CT of the abdomen and pelvis itself actually showed no evidence of colonic distention or any significant abnormalities  in the small or large bowel.  There was some locules of subcutaneous air seen in the ventral abdominal wall, that were thought to be due to injection of subcutaneous heparin.  All the patient's cultures from blood and stool are unremarkable to date.  We are asked to see the patient to assist in the workup of his persistent diarrhea in the context of HIV, AIDS and chemotherapy.  PAST MEDICAL HISTORY: 1. HIV and AIDS diagnosed this January.  CD-4 count of 80 in February.     Viral load in excess of 146,000 with K103 mutation.  Currently on     Isentress and Truvada as well as atovaquone and azithromycin for     prophylaxis for PCP and an avium. 2. B-cell lymphoma, high grade, status post biopsy of the liver which     disclosed the diagnosis as well as bone marrow biopsies and lumbar     puncture. 3. History of blood per rectum as mentioned above with colonoscopy     showing only internal hemorrhoids. 4. History of prior lymph node biopsy when he was at the age of 9. 5. Left elbow surgery with placement steel plate by Dr. Otelia Sergeant on 2010. 6. Deviated septum repair. 7. Bone marrow biopsy as described. 8. Bronchoscopy as described above.  PAST SURGICAL HISTORY:  As described above.  SOCIAL HISTORY:  The patient lives with a roommate.  He does not smoke or drink.  He is currently not sexually active.  FAMILY HISTORY:  Mother alive at age 69, healthy.  Father died of a form of cancer which he is not sure of.  REVIEW OF SYSTEMS:  As described in the history of present illness, otherwise 12-point review of systems is negative.  ALLERGIES:  Allergic to SULFA which he had severe reaction with swelling.  CURRENT MEDICATIONS: 1. Acyclovir. 2. Mepron. 3. Questran. 4. Celexa. 5. Clotrimazole. 6. Truvada. 7. Isentress. 8. Lovenox. 9. Saccharomyces boulardii. 10.Tylenol. 11.Xanax. 12.Cholestyramine. 13.Lomotil. 14.Guaifenesin. 15.Compazine. 16.Phenergan.  PHYSICAL EXAMINATION:   VITAL SIGNS:  Temperature maximum last 24 hours is 98.5, temperature current 97.6, blood pressure 112/72, pulse 80, respirations 20, and pulse ox 95% on room air.  Weight 73.6 kg. GENERAL:  The patient is quite pleasant, alert, and oriented x4. HEENT:  Normocephalic, atraumatic.  Pupils are equal, round and reactive to light.  Sclerae are anicteric.  Oropharynx is clear. NECK:  Supple. CARDIOVASCULAR:  Regular rate and rhythm.  No murmurs or rubs. LUNGS:  With some crackles at the bases on the right side.  Some coughing with deep inspiration. ABDOMEN:  Soft and nondistended, positive bowel sounds. EXTREMITIES:  No edema. SKIN:  His Port-A-Cath site is clean, dry, and intact with dressing on top.  There is no evidence of fluctuance or erythema. RECTAL:  Examination of  the perirectal area revealed some erythema just around the rectum likely due to the profuse diarrhea.  There was a little area where the skin appeared to have been torn slightly.  It was initially suspicious for fistula, but on closer examination, did not look like a fistula.  Otherwise, no significant rashes. NEUROLOGIC:  Nonfocal.  LABORATORY DATA:  CT scan as described above.  CBC with differential; white count 8.8, hemoglobin 8.6, platelets of 382,000.  Most recent differential showed ANC of 6.8.  Metabolic panel; sodium 138, potassium 4.4, chloride 107, bicarb 26, BUN and creatinine 2 and 0.68.  Glucose 99.  Microbiological data; C diff by PCR from stool negative.  Blood cultures August 09, 2010, 2/2 no growth.  Stool culture routine from August 09, 2010, no Yersinia, Shigella, or Campylobacter.  Ova and parasites done, no ova or parasites seen on August 09, 2010.  A urine culture from August 09, 2010, no growth.  Fecal lactoferrin from August 09, 2010, negative.  CSF culture July 29, 2010, no organisms seen.  No growth.  AFB smear from CSF, no organisms seen and no growth to date.  Fungal smear, no fungi seen on  CSF and no growth to date.  Blood cultures from July 16, 2010, 2/2 no growth.  Legionella antigen urine on August 14, 2010, negative.  Urine culture from July 16, 2010, negative.  AFB blood cultures from July 16, 2010, 2/2 no growth.  Legionella profile from bronchial lavage on July 02, 2010, no growth.  Of note, he did have pneumococcal antigen positive in urine. Respiratory culture from July 02, 2010, BAL grew methicillin- sensitive Staphylococcus aureus sensitive to oxacillin, Bactrim, vancomycin, and tetracycline.  AFB cultures from that bronchial lavage, 2/2 no growth final.  IMPRESSION AND RECOMMENDATIONS:  This is a 45 year old Caucasian male with human immunodeficiency virus, acquired immunodeficiency syndrome, aggressive B-cell lymphoma, status post CHOP now with admission with febrile neutropenia and profuse diarrhea. 1. Profuse diarrhea.  This has persisted despite the abatement of his     fevers and improvement in his neutrophil count.  C diff colitis is     very unlikely with a negative C diff PCR, although he was receiving     therapy prior the C diff PCR.  Furthermore, CT scan of the abdomen     and pelvis is benign and we would not expect this in the context of     C diff colitis.  Certainly, given his compromised immune status, he     is at risk for other opportunistic infections such as    Cryptosporidium, Isospora, Cyclospora, and Microsporidia.  He is     also at risk for Giardia.  I will check for the above-mentioned     pathogens which are not all looked for on his stool smears.  Other     possibilities would include cytomegalovirus or herpes virus     colitis.  These are entities that are hard to diagnose and require     a biopsy with pathology and polymerase chain reaction or viral     culture to isolate the cytomegalovirus or herpes.  We can certainly     check a CMV by PCR in the blood but this is not going to be very     meaningful given that  he could have no CMV colitis in the presence     of CMV viremia and he could have CMV colitis in the absence of CMV     viremia but  I will check this in any case.  I will recheck AFB     blood cultures.  I think if his diarrhea persists, we should talk     to GI about repeat endoscopic procedure whether it be a flexible     sigmoidoscopy or a more thorough colonoscopy to look at the lumen     and obtain biopsies so that these could be sent for cytomegalovirus     by PCR and herpes simplex virus PCR.  I would also try and amplify     Chlamydia by PCR from this area as well.  Certainly one could     consider getting gonococcal cultures but the gonorrhea should have     been treated by his cephalosporin therapy that he already received.     Syphilis would be unlikely and his RPR in serum is negative.  I     will check it again but this would be unlikely.  I suspect his     diarrhea is more likely just occurred in the context of     chemotherapy but certainly, we need to keep in mind that he is     immunocompromised and need to make sure that we have excluded any     treatable things and in particular, of all the possible things, I     would be the most worried about cytomegalovirus colitis.  I will     give Dr. Cyndie Chime a call and discuss the case further with him.     My recommendation will be if the patient continues to have loose     bowel movements, to have GI do an endoscopic procedure and obtain     tissue for PCR for Cytomegalovirus and herpes simplex virus,     implication of Chlamydia and also pathological examination. 2. Nodules seen on chest CT.  I agree with Dr. Cyndie Chime these are     not terribly likely be fungal in nature.  I will check a serum     cryptococcal antigen in urine, histoplasma antigen, and serum     galactomannan.  Note, the latter can be made falsely positive by     piperacillin/tazobactam but he did not seem to have received this     antibiotic during his  recent hospitalization.  I would not pursue     aggressive workup of these nodules at this point time but would     monitor him.  The fact that his fevers have disappeared is     reassuring.  He does continue to have a little bit of cough but     this certainly could relate to his large cavitating area in his     lung that is resolving. 3. Febrile neutropenia.  His fevers have resolved and he is doing well     off broad-spectrum antibiotics. 4. Human immunodeficiency virus.  Continue on his Truvada and his     Isentress.  With regards to diarrhea, these 2 drugs should not     cause any diarrhea.  The main concerns with these particular drugs     would be the need to renally adjust Truvada, specifically tenofovir     in the setting of renal insufficiency due to its possibility of     causing renal tubular toxicity which is not common though.     Isentress is very well tolerated, rarely will cause a rash.     Neither these drugs would cause his gastrointestinal symptoms.  Certainly, his azithromycin could cause some diarrhea but he is     only receiving this weekly.  Thank you for this infectious disease consultation.  I will continue to follow along.     Acey Lav, MD     CV/MEDQ  D:  08/17/2010  T:  08/17/2010  Job:  [id number]  cc:   Oretha Milch, MD [address] [place] [address]  Llana Aliment. [name]., M.D. Fax: [phone]  Electronically Signed by Paulette Blanch DAM MD on 09/01/2010 12:29:06 PM

## 2010-09-02 NOTE — Assessment & Plan Note (Signed)
Summary: hsfu need chart/new 042/on bser/inpt 2/12/kam   Vital Signs:  Patient profile:   45 year old male Height:      69 inches (175.26 cm) Weight:      162.75 pounds (73.98 kg) BMI:     24.12 Temp:     97.8 degrees F (36.56 degrees C) oral Pulse rate:   103 / minute BP sitting:   126 / 84  (left arm)  Vitals Entered By: Starleen Arms CMA (August 23, 2010 11:27 AM) CC: hsfu Is Patient Diabetic? No Pain Assessment Patient in pain? no      Nutritional Status BMI of 19 -24 = normal  Does patient need assistance? Functional Status Self care Ambulation Normal   Visit Type:  Follow-up Referring Provider:  pcp Primary Provider:  Paulette Blanch Dam  CC:  h[facility]u.  History of Present Illness: 5 year with HIV/AIDS, aggressive B cell lymphoma currently getting CHOP chemotherapy with Dr. Cyndie Chime. He has K103 that he inherited from HIV infected partner in 46s and was changed from [place] started durign cone admission to isentress and truvada> This was then interupted when there was concern by Dr Cyndie Chime re adverse effects of ARVs while the pt was getting ARV and in particular the concern that it would interfere with the pt getting the most optimal rx at [facility]. We have resolved that issue and he is back on a very potent regimen with no interactions with his chemo and with no risk of interfering with effectiveness of his chemo (see my dictated inpt note for details) He has done well but suffered a bout of diarrhea after his 2nd round of chemotherapy. This was worked up extensively as an inpatient during which tiime he was rx for neutropenic fevers as well. He ultimately underwent flex sig with biopsies and CMV, HSV PCR GC and chlamyida were neggativ from this site. Path not back yet. He was dc on Friday post chemo again and got dose of neulasta today. His stools are more fully formed and he is using less lomitil.   Problems Prior to Update: 1)  Fever, Hx of  (ICD-V15.9) 2)  Shortness  of Breath  (ICD-786.05) 3)  Aids  (ICD-042) 4)  Mass, Lung  (ICD-786.6) 5)  HIV Infection  (ICD-042) 6)  Bacterial Pneumonia  (ICD-482.9) 7)  Headache, Chronic  (ICD-784.0) 8)  Allergic Rhinitis  (ICD-477.9)  Medications Prior to Update: 1)  Celexa 40 Mg Tabs (Citalopram Hydrobromide) .... Take 1 Tablet By Mouth Once A Day 2)  Flonase 50 Mcg/act Susp (Fluticasone Propionate) .... 2 Sprays Each Nostril Once Daily 3)  Pramosone 1-2.5 % Lotn (Pramoxine-Hc) .... As Needed  Current Medications (verified): 1)  Celexa 40 Mg Tabs (Citalopram Hydrobromide) .... Take 1 Tablet By Mouth Once A Day 2)  Flonase 50 Mcg/act Susp (Fluticasone Propionate) .... 2 Sprays Each Nostril Once Daily 3)  Pramosone 1-2.5 % Lotn (Pramoxine-Hc) .... As Needed 4)  Isentress 400 Mg Tabs (Raltegravir Potassium) .... Take 1 Tablet By Mouth Two Times A Day 5)  Truvada 200-300 Mg Tabs (Emtricitabine-Tenofovir) .... Take 1 Tablet By Mouth Once A Day 6)  Mepron 750 Mg/96ml Susp (Atovaquone) .... Take 10ml Daily, Dispense 1 Month Supply 7)  Zithromax 600 Mg Tabs (Azithromycin) .... Take Two Tablets Once Weekly 8)  Valacyclovir Hcl 500 Mg Tabs (Valacyclovir Hcl) .... Take 1 Tablet By Mouth Once A Day 9)  Zofran 4 Mg Tabs (Ondansetron Hcl) .Marland Kitchen.. 1 Tablet Under Tongue For Nausea Every 8 Hours As Needed  10)  Alprazolam 0.5 Mg Tabs (Alprazolam) .... Every 6 Hours As Needed For Anxiety  Allergies: 1)  ! Sulfa  Past History:  Past Medical History: HEADACHE, CHRONIC (ICD-784.0) ALLERGIC RHINITIS (ICD-477.9) HIV, AIDS B cell lympoma Post chemo diarrhea Neutropenic fevers  Review of Systems       see HPI sig for hair loss , minimal cough now otherwise neg on 12 pt review  Physical Exam  General:  alert and well-developed.   Head:  normocephalic and atraumatic.  losing hair Eyes:  vision grossly intact, pupils equal, pupils round, and pupils reactive to light.   Ears:  no external deformities.   Nose:  no external  deformity and no external erythema.   Mouth:  no gingival abnormalities, no dental plaque, and pharynx pink and moist.   Neck:  supple and full ROM.   Lungs:  few crackles at right base Heart:  normal rate, regular rhythm, and no murmur.   Abdomen:  soft, non-tender, normal bowel sounds, and no distention.   Msk:  normal ROM and no joint tenderness.   Neurologic:  alert & oriented X3, strength normal in all extremities, and gait normal.   Skin:  turgor normal and no rashes.  porta cath cdi Psych:  Oriented X3, memory intact for recent and remote,         Medication Adherence: 08/23/2010   Adherence to medications reviewed with patient. Counseling to provide adequate adherence provided   Prevention For Positives: 08/23/2010   Safe sex practices discussed with patient. Condoms offered.   Education Materials Provided: 08/23/2010 Safe sex practices discussed with patient. Condoms offered.                          Impression & Recommendations:  Problem # 1:  AIDS (ICD-042) will recheck his vl and cd4 though need to keep in mind effect of rx interuption on vl and chemo on cd4, bring back for labs in one month and visit in 6 wks. Cotninue atovo for pcp prophylaxis and azitrho for MAI prophy His updated medication list for this problem includes:    Zithromax 600 Mg Tabs (Azithromycin) .Marland Kitchen... Take two tablets once weekly    Valacyclovir Hcl 500 Mg Tabs (Valacyclovir hcl) .Marland Kitchen... Take 1 tablet by mouth once a day  Orders: Est. Patient Level IV (36644)  Problem # 2:  DIARRHEA (ICD-787.91)  was due to chemo as best we can tell resolving withotu intervention other than lomotil. Extensively worked up for infectios pathogens His updated medication list for this problem includes:    Zithromax 600 Mg Tabs (Azithromycin) .Marland Kitchen... Take two tablets once weekly  Orders: Est. 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  Cyndie Chime  Orders: Est. Patient Level IV (25956)  Problem # 4:  NEUTROPENIA, DRUG-INDUCED (ICD-288.03)  got neulasta today  Orders: Est. Patient Level IV (38756)  Problem # 5:  MASS, LUNG (ICD-786.6) was djue to his b cel llymphoma and iimproved post caviation with chemo and time Orders: Est. Patient Level IV (43329)  Medications Added to Medication List This Visit: 1)  Isentress 400 Mg Tabs (Raltegravir potassium) .... Take 1 tablet by mouth two times a day 2)  Truvada 200-300 Mg Tabs (Emtricitabine-tenofovir) .... Take 1 tablet by mouth once a day 3)  Mepron 750 Mg/29ml Susp (Atovaquone) .... Take 10ml daily, dispense 1 month supply 4)  Zithromax 600 Mg Tabs (Azithromycin) .Marland KitchenMarland KitchenMarland Kitchen  Take two tablets once weekly 5)  Valacyclovir Hcl 500 Mg Tabs (Valacyclovir hcl) .... Take 1 tablet by mouth once a day 6)  Zofran 4 Mg Tabs (Ondansetron hcl) .Marland Kitchen.. 1 tablet under tongue for nausea every 8 hours as needed 7)  Alprazolam 0.5 Mg Tabs (Alprazolam) .... Every 6 hours as needed for anxiety  Other Orders: T-CD4SP [facility]) (CD4SP) T-HIV Viral Load [phone]) Future Orders: T-CD4SP ([facility]) (CD4SP) ... 09/27/2010 T-HIV Viral Load [id number]) ... 09/27/2010 T-CBC w/Diff ([id number]) ... 09/27/2010 T-Comprehensive Metabolic Panel [id number]) ... 09/27/2010  Patient Instructions: 1)  rtc for labs in late April and visit with Dr Daiva Eves in early May  Prescriptions: VALACYCLOVIR HCL 500 MG TABS (VALACYCLOVIR HCL) Take 1 tablet by mouth once a day  #30 x 11   Entered and Authorized by:   Acey Lav MD   Signed by:   Paulette Blanch Dam MD on 08/23/2010   Method used:   Electronically to        [facility]  [address] [phone]* (retail)       [address]       [place], [place]  [address]       Ph: [phone] or [phone]       Fax: [phone]   RxID:   [id number] ZITHROMAX 600 MG TABS (AZITHROMYCIN) take two tablets once weekly  #24 x 4   Entered and Authorized  by:   Acey Lav MD   Signed by:   Paulette Blanch Dam MD on 08/23/2010   Method used:   Electronically to        [facility]  [address] [phone]* (retail)       [address]       [place], [place]  [address]       Ph: [phone] or [phone]       Fax: [phone]   RxID:   [id number] MEPRON 750 MG/5ML SUSP (ATOVAQUONE) take 10ml daily, dispense 1 month supply  #1 x 11   Entered and Authorized by:   Acey Lav MD   Signed by:   Paulette Blanch Dam MD on 08/23/2010   Method used:   Electronically to        [facility]  [address] [phone]* (retail)       [address]       [place], [place]  [address]       Ph: [phone] or [phone]       Fax: [phone]   RxID:   [id number] TRUVADA 200-300 MG TABS (EMTRICITABINE-TENOFOVIR) Take 1 tablet by mouth once a day  #90 x 4   Entered and Authorized by:   Acey Lav MD   Signed by:   Paulette Blanch Dam MD on 08/23/2010   Method used:   Electronically to        [facility]  [address] [phone]* (retail)       [address]       [place], [place]  [address]       Ph: [phone] or [phone]       Fax: [phone]   RxID:   [id number] ISENTRESS 400 MG TABS (RALTEGRAVIR POTASSIUM) Take 1 tablet by mouth two times a day  #180 x 4   Entered and Authorized by:   Acey Lav MD   Signed by:   Paulette Blanch Dam MD on 08/23/2010   Method used:   [facility]ronically to  [facility]  [address] [phone]* (retail)       [address]       [place], [place]  [address]       Ph: [phone] or [phone]       Fax: [phone]   RxID:   [id number]    Orders Added: 1)  T-CD4SP [facility]) [CD4SP] 2)  T-HIV Viral Load [id number] 3)  T-CD4SP [facility]) [CD4SP] 4)  T-HIV Viral Load [phone] 5)  T-CBC w/Diff [[id number]] 6)  T-Comprehensive Metabolic Panel [[id number]] 7)  Est. Patient Level IV [16606]

## 2010-09-02 NOTE — Miscellaneous (Addendum)
Summary: hospital admission  INTERNAL MEDICINE ADMISSION HISTORY AND PHYSICAL  PCP: none ID: Dr. Daiva Eves Oncology: Dr. Marlena Clipper Pulmonary: Dr. Cyril Mourning  ATTENDING:  1st contact:  2nd contact: Nights and weekends: [phone], [phone]  CC: n/v/d,fever  HPI:  ALLERGIES: SULFA   PAST MEDICAL HISTORY:  1. Stage IVB high-grade B-cell non-Hodgkin's lymphoma diagnosed 2/12. Pathological confirmation from bronchial washing and liver biopsy. PET scan done July 19, 2010, showed extensive uptake in the right upper lung, additional areas of uptake in the right nasopharynx, right oropharynx, two areas in the left upper lung,mediastinum on the right and a single hypermetabolic focus in the liver. Has a Port-A-Cath infusion device and is on CHOP chemotherapy by Dr. Cyndie Chime.   2. 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 Dx in 2/11, acquired from HIV positive male sexual partner who has passed away now, CD4 80 and viral load of 129000 in 2/11, TAMs and K103 mutation confirmed resistant to efavirenz and possible ATRIPLA as well. HAART per Dr. Daiva Eves  3. Chronic depression.   4. Recent rectal bleeding with negative colonoscopy July 23, 2010, except for findings of internal hemorrhoids.   5. Chornic migraine headaches   6. Allergic rhinits   7. Discharged from hospital on 08/02/10, was admitted for pneumonia     MEDICATIONS:  1. Avelox 400 mg daily for pneumonia during last admission 2. Acyclovir 200 mg two p.o. b.i.d.  3. Mycelex troches 10 mg dissolve in mouth one 4 times daily.  4. Allopurinol 300 mg one daily x 21 days, then stop.  5. Compazine 10 mg q.6 hours p.r.n. nausea.  6. Zofran oral dissolving tablet 4 mg one to two sublingual q.8 hours     p.r.n. nausea or vomiting not controlled by Compazine.  7. Guaifenesin/DM 100/10 mg per 5 mL, 10 mL p.o. q.4 hours p.r.n.       cough.  8. Truvada 200/300 mg one daily.  9. Isentress 400 mg one  b.i.d. p.o.  10.Flonase two sprays daily p.r.n. for allergic rhinitis.  11.Tylenol Extra Strength 500 mg one to two q.6 hours p.r.n. fever.  12.Xanax 0.5 mg q.6 hours p.r.n. anxiety.  13.Mepron 750 mg per 5 mL, 10 mL equals 1500 mg daily with a meal as     PCP prophylaxis (the patient is penicillin allergic).  14.Celexa 40 mg one daily.      SOCIAL HISTORY: Marital Status: Single, lives with roommate Children: no Occupation:Project Analyst with [employer]  Former smoker age [age]  ~1PPD  MSM, had HIV positive partner with whom he used to have unprotected intercourse for years untill he passed away few years ago. The patient refused to get tested for HIV as he was afraid to find out the truth.    FAMILY HISTORY Originally from [place], parents divorced.  Father was   an alcoholic.  Mother moved to [place] where he grew up.  Father died   of complications of colon cancer and alcoholism.  Mother still alive and   well living in the [place].  He has 4 brothers, oldest had a mild   stroke and is obese, and 2 sisters, one who survived a ruptured carotid   artery aneurysm. Lung cancer in paternal grandfather    VITALS: T: 100.1  P: 132  BP:151/73  R: 22 O2SAT: 94% ON: RA  PHYSICAL EXAM: Gen HEENT CVS Respi Abd Neuro Extremity  LABS:  Sodium (NA)  125        l      135-145          mEq/L Potassium (K)                            3.6               3.5-5.1          mEq/L Chloride                                 95         l      96-112           mEq/L CO2                                      21                19-32            mEq/L Glucose                                  103        h      70-99            mg/dL BUN                                      11                6-23             mg/dL Creatinine                               0.72              0.4-1.5          mg/dL GFR, Est Non African American            >60               >60               mL/min GFR, Est African American                >60               >60              mL/min Calcium                                  8.1        l      8.4-10.5         mg/dL  WBC                                      <0.4       L      4.0-10.5  K/uL    RESULT REPEATED AND VERIFIED    CRITICAL RESULT CALLED TO, READ BACK BY AND VERIFIED WITH:    T CAMPBELL RN AT 0230 08/09/2010 BY R LINEBERRY RBC                                      3.58       l      4.22-5.81        MIL/uL Hemoglobin (HGB)                         10.3       l      13.0-17.0        g/dL Hematocrit (HCT)                         30.1       l      39.0-52.0        % MCV                                      84.1              78.0-100.0       fL MCH -                                    28.8              26.0-34.0        pg MCHC                                     34.2              30.0-36.0        g/dL RDW                                      12.5              11.5-15.5        % Platelet Count (PLT)                     60         l      150-400          K/uL Nucleated RBCs                           0                 0                /100 WBC Band Neutrophils                         0                 0-10             % Neutrophils, %  0          l      43-77            % Lymphocytes, %                           0          l      12-46            % Monocytes, %                             0          l      3-12             % Eosinophils, %                           0                 0-5              % Basophils, %                             0                 0-1              % Lymphocytes, Absolute                    0          l      0.7-4.0          K/uL Monocytes, Absolute                      0          l      0.1-1.0          K/uL Eosinophils, Absolute                    0                 0.0-0.7          K/uL Basophils, Absolute                      0                 0.0-0.1          K/uL WBC  Morphology                           SEE NOTE.    RARE LYMPHOCYTES SEEN ON SMEAR    TOO FEW TO COUNT, SMEAR AVAILABLE FOR REVIEW    Color, Urine                             AMBER      a      YELLOW    BIOCHEMICALS MAY BE AFFECTED BY COLOR  Appearance  CLOUDY     a      CLEAR  Specific Gravity                         1.028             1.005-1.030  pH                                       6.0               5.0-8.0  Urine Glucose                            NEGATIVE          NEG              mg/dL  Bilirubin                                NEGATIVE          NEG  Ketones                                  15         a      NEG              mg/dL  Blood                                    NEGATIVE          NEG  Protein                                  100        a      NEG              mg/dL  Urobilinogen                             0.2               0.0-1.0          mg/dL  Nitrite                                  NEGATIVE          NEG  Leukocytes                               NEGATIVE          NEG  Squamous Epithelial / LPF                RARE              RARE  WBC / HPF  3-6               <3               WBC/hpf  RBC / HPF                                0-2               <3               RBC/hpf  Bacteria / HPF                           FEW        a      RARE  Urine-Other                              SEE NOTE.    MUCOUS PRESENT  2-View CXR: Stable right upper lobe mass/airspace disease.   ASSESSMENT AND PLAN:  VTE PROPH: lovenox

## 2010-09-03 ENCOUNTER — Other Ambulatory Visit: Payer: Self-pay | Admitting: Oncology

## 2010-09-03 ENCOUNTER — Encounter (HOSPITAL_BASED_OUTPATIENT_CLINIC_OR_DEPARTMENT_OTHER): Payer: 59 | Admitting: Oncology

## 2010-09-03 DIAGNOSIS — B20 Human immunodeficiency virus [HIV] disease: Secondary | ICD-10-CM

## 2010-09-03 DIAGNOSIS — C8589 Other specified types of non-Hodgkin lymphoma, extranodal and solid organ sites: Secondary | ICD-10-CM

## 2010-09-03 LAB — CBC WITH DIFFERENTIAL/PLATELET
BASO%: 0.6 % (ref 0.0–2.0)
EOS%: 0.4 % (ref 0.0–7.0)
HCT: 29.6 % — ABNORMAL LOW (ref 38.4–49.9)
MCHC: 33.6 g/dL (ref 32.0–36.0)
MONO#: 0.2 10*3/uL (ref 0.1–0.9)
NEUT%: 90.6 % — ABNORMAL HIGH (ref 39.0–75.0)
RDW: 20.7 % — ABNORMAL HIGH (ref 11.0–14.6)
WBC: 8.3 10*3/uL (ref 4.0–10.3)
lymph#: 0.5 10*3/uL — ABNORMAL LOW (ref 0.9–3.3)

## 2010-09-03 LAB — COMPREHENSIVE METABOLIC PANEL
ALT: 25 U/L (ref 0–53)
AST: 21 U/L (ref 0–37)
Albumin: 3.3 g/dL — ABNORMAL LOW (ref 3.5–5.2)
CO2: 25 mEq/L (ref 19–32)
Calcium: 9.1 mg/dL (ref 8.4–10.5)
Chloride: 107 mEq/L (ref 96–112)
Creatinine, Ser: 0.87 mg/dL (ref 0.40–1.50)
Potassium: 3.9 mEq/L (ref 3.5–5.3)
Sodium: 140 mEq/L (ref 135–145)
Total Protein: 6.6 g/dL (ref 6.0–8.3)

## 2010-09-03 LAB — LACTATE DEHYDROGENASE: LDH: 170 U/L (ref 94–250)

## 2010-09-06 ENCOUNTER — Telehealth: Payer: Self-pay | Admitting: Licensed Clinical Social Worker

## 2010-09-06 NOTE — Telephone Encounter (Signed)
The patient wanted Dr. Daiva Eves to know that his diarrhea stopped since he stopped the zithromax. He had diarrhea on Saturday, but none yesterday or and a soft stool today. He thinks that the worst of the diarrhea may be over.

## 2010-09-06 NOTE — Telephone Encounter (Signed)
Thanks Tamika. Discussed with pt

## 2010-09-08 ENCOUNTER — Telehealth: Payer: Self-pay | Admitting: *Deleted

## 2010-09-08 DIAGNOSIS — B20 Human immunodeficiency virus [HIV] disease: Secondary | ICD-10-CM

## 2010-09-08 MED ORDER — ATOVAQUONE 750 MG/5ML PO SUSP: 1500.0000 mg | Freq: Every day | ORAL | Status: DC

## 2010-09-08 MED ORDER — RALTEGRAVIR POTASSIUM 400 MG PO TABS: 400.0000 mg | ORAL_TABLET | Freq: Two times a day (BID) | ORAL | Status: DC

## 2010-09-08 MED ORDER — VALACYCLOVIR HCL 500 MG PO TABS: 500.0000 mg | ORAL_TABLET | Freq: Every day | ORAL | Status: DC

## 2010-09-08 MED ORDER — EMTRICITABINE-TENOFOVIR DF 200-300 MG PO TABS: 1.0000 | ORAL_TABLET | Freq: Every day | ORAL | Status: DC

## 2010-09-08 NOTE — Telephone Encounter (Signed)
Can we print these out in to send off for him then? [name] I may need help finishing this as I did this wrong the last time

## 2010-09-08 NOTE — Telephone Encounter (Signed)
Mr Shughart called about his Rx for Truvada and Isentress he said that his insurance will only pay for these through mail order written as supply with 3 refills. He also said he needs a paper Rx to send off.Wants to come by and pick those up ASAP. He also mentioned that he was put on Valtrex and if Dr Daiva Eves wants to keep him on this he will also needs a paper Rx for this also.

## 2010-09-08 NOTE — Telephone Encounter (Signed)
Addended by: Acey Lav on: 09/08/2010 07:04 PM   Modules accepted: Orders

## 2010-09-09 ENCOUNTER — Other Ambulatory Visit: Payer: Self-pay | Admitting: Licensed Clinical Social Worker

## 2010-09-09 DIAGNOSIS — B20 Human immunodeficiency virus [HIV] disease: Secondary | ICD-10-CM

## 2010-09-09 MED ORDER — EMTRICITABINE-TENOFOVIR DF 200-300 MG PO TABS: 1.0000 | ORAL_TABLET | Freq: Every day | ORAL | Status: DC

## 2010-09-09 MED ORDER — RALTEGRAVIR POTASSIUM 400 MG PO TABS: 400.0000 mg | ORAL_TABLET | Freq: Two times a day (BID) | ORAL | Status: DC

## 2010-09-10 ENCOUNTER — Encounter (HOSPITAL_BASED_OUTPATIENT_CLINIC_OR_DEPARTMENT_OTHER): Payer: 59 | Admitting: Oncology

## 2010-09-10 DIAGNOSIS — B20 Human immunodeficiency virus [HIV] disease: Secondary | ICD-10-CM

## 2010-09-10 DIAGNOSIS — C8588 Other specified types of non-Hodgkin lymphoma, lymph nodes of multiple sites: Secondary | ICD-10-CM

## 2010-09-10 DIAGNOSIS — Z5111 Encounter for antineoplastic chemotherapy: Secondary | ICD-10-CM

## 2010-09-10 LAB — AFB CULTURE WITH SMEAR (NOT AT ARMC): Acid Fast Smear: NONE SEEN

## 2010-09-11 ENCOUNTER — Encounter: Payer: 59 | Admitting: Oncology

## 2010-09-11 DIAGNOSIS — Z5189 Encounter for other specified aftercare: Secondary | ICD-10-CM

## 2010-09-13 ENCOUNTER — Telehealth: Payer: Self-pay | Admitting: *Deleted

## 2010-09-13 LAB — HEMOGLOBIN AND HEMATOCRIT, BLOOD: HCT: 44.2 % (ref 39.0–52.0)

## 2010-09-13 NOTE — Telephone Encounter (Signed)
Dr. Daiva Eves,   Maurine Minister called stating he no longer has diarrhea and wants to know if he should restart the Zithromax.  Also asking about his G6PD lab results.  Call back number [phone]  Thanks, Annice Pih

## 2010-09-16 NOTE — H&P (Signed)
[name], [name]            ACCOUNT NO.:  [id number]  MEDICAL RECORD NO.:  [id number]           PATIENT TYPE:  E  LOCATION:  WLED                         FACILITY:  [facility]  PHYSICIAN:  Massie Maroon, MD        DATE OF BIRTH:  09-24-1965  DATE OF ADMISSION:  08/09/2010 DATE OF DISCHARGE:                             HISTORY & PHYSICAL   CHIEF COMPLAINT:  Subjective fever.  HISTORY OF PRESENT ILLNESS:  A 45 year old male with HIV, non-Hodgkin lymphoma, apparently presented to the ED with complaints of subjective fever, nausea, vomiting, and diarrhea.  The patient notes that he has had some slight cough with white-to-yellow sputum and some slight shortness of breath with exertion.  He denies any chest pain or palpitations.  He does note some slight nausea, vomiting, and diarrhea. He has had 6 episodes of diarrhea, which were loose stool today and the last being very watery.  He denies any abdominal pain, bright red blood per rectum, or black stool.  The patient was evaluated in the ED and chest x-ray showed stable right upper lobe mass/airspace disease.  He was noted to be neutropenic with a white count of less than 0.4, hemoglobin of 10.3, and a platelet count of 60.  He was noted to be pancytopenic.  Lactic acid was within normal limits.  He was also noted to be slightly hyponatremic with a sodium 125.  His kidney function was intact.  Urinalysis showed white blood cells 3-6.  The patient will be admitted for febrile neutropenia.  PAST MEDICAL HISTORY: 1. High-grade B-cell non-Hodgkin lymphoma (CD45 and CD45 RV positives.     The CD20 and CD79A negative, bone marrow biopsies July 23, 2010     negative for lymphoma) 2. HIV (CD-4 count July 16, 2010 80, HIV RNA 129,000). 3. History of a rectal bleed - colonoscopy by Dr. Carman Ching on     July 23, 2010 - internal hemorrhoids. 4. Lymph node biopsy at age 53 - unknown results. 5. Left elbow surgery with placement  of a steel plate in 0454. 6. Deviated septum repair. 7. Bone marrow biopsy. 8. Bronchoscopy, July 02, 2010. 9. Colonoscopy July 23, 2010.  SOCIAL HISTORY:  The patient lives with his roommate.  He does not smoke or drink.  FAMILY HISTORY:  Mother is alive at age 7 and healthy.  Father died of some form of cancer of which, he is not sure.  ALLERGIES:  SULFA.  MEDICATIONS: 1. Acyclovir 200 mg 2 p.o. b.i.d. 2. Mycelex troches 10 mg q.i.d. 3. Allopurinol 300 mg p.o. daily for 21 days, stop on March 16th. 4. Compazine 10 mg p.o. q.6 h p.r.n. nausea. 5. Zofran 4 mg 1-2 sublingual q.8 h p.r.n. nausea. 6. Truvada 200/300 mg 1 p.o. daily. 7. Isentress 400 mg 1 p.o. b.i.d. 8. Flonase p.r.n. 9. Tylenol Extra Strength 1-2 q.6 h. p.r.n. fever. 10.Xanax 0.5 mg p.o. q.6 h. p.r.n. anxiety. 11.Mepron 750 mg per 10 mL with each meal as PCP prophylaxis. 12.Celexa 40 mg p.o. daily.  REVIEW OF SYSTEMS:  Negative for all 10 organ systems except for pertinent positives stated above.  PHYSICAL EXAMINATION:  VITAL SIGNS:  Temperature 100.1, pulse 132, blood pressure 151/73, pulse ox is 94% on room air. HEENT:  Anicteric. NECK:  No JVD, no bruit. HEART:  Regular rate and rhythm, S1-S2. LUNGS:  Clear to auscultation bilaterally. ABDOMEN:  Soft, nontender, nondistended.  Positive bowel sounds. EXTREMITIES:  No cyanosis, clubbing or edema.  No splinter, no Janeway, no Osler. RECTAL:  Hemorrhoid at 3 o'clock, otherwise no evidence of any perirectal abscess. SKIN:  No rashes, lymph nodes, or adenopathy. NEUROLOGIC:  Nonfocal, cranial nerves II through XII intact.  Reflexes 2+, symmetric, diffuse with downgoing toes bilaterally, motor strength 5/5 in all 4 extremities, pinprick intact, no nuchal rigidity, negative Kernig, and negative Brudzinski.  ASSESSMENT/PLAN: 1. Febrile neutropenia:  The patient will be covered with vanco IV,     cefepime, and Zithromax due to complaints of cough. 2.  Pancytopenia:  Please consult Oncology in the morning. 3. Diarrhea:  Check stool for fecal leukocytes, culture, C diff, PCR,     ova and parasites.  Discontinue Avelox, start on Flagyl 500 mg p.o.     t.i.d. empirically. 4. Human immunodeficiency virus:  Continue Truvada, continue     Isentress, and continue Mepron. 5. Non-Hodgkin lymphoma:  Continue allopurinol and acyclovir.  Follow     up with Oncology. 6. Deep vein thrombosis prophylaxis:  SCDs in light of pancytopenia.     Massie Maroon, MD     JYK/MEDQ  D:  08/09/2010  T:  08/09/2010  Job:  [id number]  cc:   Genene Churn. Cyndie Chime, M.D. Fax: [phone]  Acey Lav, MD Fax: [phone]  Oretha Milch, MD [address] [place] [address]  Doneen Poisson, MD Fax: [phone]  Electronically Signed by Pearson Grippe MD on 09/16/2010 01:41:44 AM

## 2010-09-21 ENCOUNTER — Encounter: Payer: Self-pay | Admitting: Licensed Clinical Social Worker

## 2010-09-24 ENCOUNTER — Other Ambulatory Visit: Payer: Self-pay | Admitting: Oncology

## 2010-09-24 ENCOUNTER — Encounter (HOSPITAL_BASED_OUTPATIENT_CLINIC_OR_DEPARTMENT_OTHER): Payer: 59 | Admitting: Oncology

## 2010-09-24 DIAGNOSIS — C8589 Other specified types of non-Hodgkin lymphoma, extranodal and solid organ sites: Secondary | ICD-10-CM

## 2010-09-24 DIAGNOSIS — Z21 Asymptomatic human immunodeficiency virus [HIV] infection status: Secondary | ICD-10-CM

## 2010-09-24 DIAGNOSIS — G609 Hereditary and idiopathic neuropathy, unspecified: Secondary | ICD-10-CM

## 2010-09-24 DIAGNOSIS — C8590 Non-Hodgkin lymphoma, unspecified, unspecified site: Secondary | ICD-10-CM

## 2010-09-24 DIAGNOSIS — B20 Human immunodeficiency virus [HIV] disease: Secondary | ICD-10-CM

## 2010-09-24 LAB — CBC WITH DIFFERENTIAL/PLATELET
BASO%: 0.3 % (ref 0.0–2.0)
EOS%: 0.3 % (ref 0.0–7.0)
HCT: 32.3 % — ABNORMAL LOW (ref 38.4–49.9)
MCH: 32.2 pg (ref 27.2–33.4)
MCHC: 33.8 g/dL (ref 32.0–36.0)
NEUT%: 86.7 % — ABNORMAL HIGH (ref 39.0–75.0)
RDW: 23.2 % — ABNORMAL HIGH (ref 11.0–14.6)
lymph#: 0.5 10*3/uL — ABNORMAL LOW (ref 0.9–3.3)

## 2010-09-24 LAB — COMPREHENSIVE METABOLIC PANEL
ALT: 20 U/L (ref 0–53)
AST: 15 U/L (ref 0–37)
Calcium: 9.5 mg/dL (ref 8.4–10.5)
Chloride: 105 mEq/L (ref 96–112)
Creatinine, Ser: 0.84 mg/dL (ref 0.40–1.50)

## 2010-09-27 ENCOUNTER — Other Ambulatory Visit: Payer: Self-pay | Admitting: Infectious Disease

## 2010-09-27 ENCOUNTER — Other Ambulatory Visit (INDEPENDENT_AMBULATORY_CARE_PROVIDER_SITE_OTHER): Payer: 59

## 2010-09-27 ENCOUNTER — Other Ambulatory Visit: Payer: Self-pay | Admitting: Oncology

## 2010-09-27 DIAGNOSIS — B20 Human immunodeficiency virus [HIV] disease: Secondary | ICD-10-CM

## 2010-09-27 DIAGNOSIS — C8590 Non-Hodgkin lymphoma, unspecified, unspecified site: Secondary | ICD-10-CM

## 2010-09-28 LAB — COMPLETE METABOLIC PANEL WITH GFR
ALT: 24 U/L (ref 0–53)
Albumin: 4 g/dL (ref 3.5–5.2)
CO2: 22 mEq/L (ref 19–32)
Chloride: 106 mEq/L (ref 96–112)
GFR, Est African American: >60 mL/min (ref >60)
GFR, Est Non African American: >60 mL/min (ref >60)
Glucose, Bld: 93 mg/dL (ref 70–99)
Potassium: 4.4 mEq/L (ref 3.5–5.3)
Sodium: 141 mEq/L (ref 135–145)
Total Protein: 6.6 g/dL (ref 6.0–8.3)

## 2010-09-28 LAB — CBC WITH DIFFERENTIAL/PLATELET
Hemoglobin: 11.2 g/dL — ABNORMAL LOW (ref 13.0–17.0)
Lymphocytes Relative: 13 % (ref 12–46)
Lymphs Abs: 0.6 10*3/uL — ABNORMAL LOW (ref 0.7–4.0)
MCH: 32.1 pg (ref 26.0–34.0)
Monocytes Relative: 9 % (ref 3–12)
Neutro Abs: 3.7 10*3/uL (ref 1.7–7.7)
Neutrophils Relative %: 77 % (ref 43–77)
RBC: 3.49 MIL/uL — ABNORMAL LOW (ref 4.22–5.81)
WBC: 4.8 10*3/uL (ref 4.0–10.5)

## 2010-09-29 ENCOUNTER — Ambulatory Visit (HOSPITAL_COMMUNITY): Payer: 59

## 2010-09-29 ENCOUNTER — Other Ambulatory Visit (HOSPITAL_COMMUNITY): Payer: 59

## 2010-09-29 LAB — AFB CULTURE WITH SMEAR (NOT AT ARMC): Acid Fast Smear: NONE SEEN

## 2010-09-29 LAB — HIV-1 RNA QUANT-NO REFLEX-BLD
HIV 1 RNA Quant: 69 copies/mL — ABNORMAL HIGH (ref <20)
HIV-1 RNA Quant, Log: 1.84 {Log} — ABNORMAL HIGH (ref <1.30)

## 2010-09-29 LAB — AFB CULTURE, BLOOD

## 2010-09-29 NOTE — Consult Note (Signed)
[name], Rodriguez            ACCOUNT NO.:  [id number]  MEDICAL RECORD NO.:  [id number]           PATIENT TYPE:  I  LOCATION:  1340                         FACILITY:  [facility]  PHYSICIAN:  Willis Modena, MD     DATE OF BIRTH:  April 21, 1966  DATE OF CONSULTATION:  08/17/2010 DATE OF DISCHARGE:                                CONSULTATION   CHIEF COMPLAINT:  Diarrhea.  HISTORY OF PRESENT ILLNESS:  [name] is a 45 year old gentleman, diagnosed with B-cell lymphoma, involving the lung and liver.  He has received 1 round of treatment.  He also has a history of HIV with seemingly low CD-4 count.  He presents to the hospital with diffuse nausea, vomiting, and diarrhea.  He was found also to be febrile with neutropenia and has been treated for that.  He has improved from nausea and vomiting standpoint, but his diarrhea persists.  He describes several loose, albeit low-volume, bowel movements daily.  He has urgency and nocturnal stooling.  No blood in his stool.  He underwent colonoscopy about 3 weeks ago for evaluation of hematochezia by Dr. Randa Evens and was found to have internal hemorrhoids, but otherwise normal exam.  Past medical history, past surgical history, family history, social history, medications, allergies, review of systems, all from dictated note from Dr. Selena Batten dated, August 09, 2010.  I have reviewed and I agree.  PHYSICAL EXAMINATION:  VITAL SIGNS:  Blood pressure 109/74, heart rate 85, respiratory rate 20, temperature 98.5, oxygen saturation 93% on room air.  GENERAL:  [name] is in no acute distress.  He does appear somewhat older than stated age. HEENT:  Eyes, sclerae anicteric.  Conjunctiva pink.  ENT, normocephalic, atraumatic.  No oropharyngeal lesions. NECK:  Supple without thyromegaly. HEART:  Regular rhythm. LUNGS:  Crackles at upper and lower air field, mildly diffusely. ABDOMEN:  Soft, nontender, nondistended.  Normoactive bowel sounds.  No liver  or splenic enlargement.  No bulging flanks to suggest ascites. EXTREMITIES:  No peripheral cyanosis, clubbing, or edema. NEUROLOGIC:  Diffusely nonfocal without lateralizing signs. PSYCHIATRIC:  Normal mood and affect.  LABORATORY STUDIES:  White count 8.8, hemoglobin 8.6, platelet count 382.  Sodium 138, potassium 4.4, chloride 107, bicarbonate 26, BUN 2, creatinine 0.7.  Liver function tests other than the low albumin 2.5 were normal.  He has reportedly had a negative CMV PCR that has been done as an outpatient, I could not see the results here.  He has had stool cultures ordered and pending.  IMPRESSION:  [name] is a 45 year old gentleman with human immunodeficiency virus as well as B-cell lymphoma.  He has received 1 round of chemotherapy.  He presents for refractory diarrhea. Colonoscopy done a few weeks ago, prior to his chemotherapy and prior to his diarrheal symptoms, was negative, but no biopsies were done. Multiple differential considerations are possible including a medication effect versus some type of occult infectious process.  PLAN: 1. We would continue supportive management, as you are doing. 2. We will perform a sigmoidoscopy with random biopsies tomorrow.  I     do not think he needs another repeat full colonoscopy as he  had a     full colonoscopy 3 weeks ago. 3. The risks, benefits, and rationale for the procedure were explained     to the patient and he wishes to proceed.  Thank you again for allowing me to participate in Mr. Mehring's care.     Willis Modena, MD     WO/MEDQ  D:  08/17/2010  T:  08/17/2010  Job:  [id number]  Electronically Signed by Willis Modena  on 09/29/2010 01:58:26 PM

## 2010-09-30 ENCOUNTER — Other Ambulatory Visit (HOSPITAL_COMMUNITY): Payer: 59

## 2010-09-30 ENCOUNTER — Ambulatory Visit (HOSPITAL_COMMUNITY): Payer: 59

## 2010-09-30 ENCOUNTER — Ambulatory Visit (HOSPITAL_COMMUNITY)
Admission: RE | Admit: 2010-09-30 | Discharge: 2010-09-30 | Disposition: A | Discharge status: Home / Self Care | Payer: 59 | Source: Ambulatory Visit | Attending: Oncology | Admitting: Oncology

## 2010-09-30 DIAGNOSIS — C8589 Other specified types of non-Hodgkin lymphoma, extranodal and solid organ sites: Secondary | ICD-10-CM | POA: Insufficient documentation

## 2010-09-30 DIAGNOSIS — C8590 Non-Hodgkin lymphoma, unspecified, unspecified site: Secondary | ICD-10-CM

## 2010-10-01 ENCOUNTER — Other Ambulatory Visit (HOSPITAL_COMMUNITY): Payer: 59

## 2010-10-01 ENCOUNTER — Encounter (HOSPITAL_BASED_OUTPATIENT_CLINIC_OR_DEPARTMENT_OTHER): Payer: 59 | Admitting: Oncology

## 2010-10-01 DIAGNOSIS — Z5111 Encounter for antineoplastic chemotherapy: Secondary | ICD-10-CM

## 2010-10-01 DIAGNOSIS — B20 Human immunodeficiency virus [HIV] disease: Secondary | ICD-10-CM

## 2010-10-01 DIAGNOSIS — C8589 Other specified types of non-Hodgkin lymphoma, extranodal and solid organ sites: Secondary | ICD-10-CM

## 2010-10-02 ENCOUNTER — Encounter (HOSPITAL_BASED_OUTPATIENT_CLINIC_OR_DEPARTMENT_OTHER): Payer: 59 | Admitting: Oncology

## 2010-10-02 ENCOUNTER — Ambulatory Visit (HOSPITAL_COMMUNITY): Payer: 59

## 2010-10-02 DIAGNOSIS — B20 Human immunodeficiency virus [HIV] disease: Secondary | ICD-10-CM

## 2010-10-02 DIAGNOSIS — Z5189 Encounter for other specified aftercare: Secondary | ICD-10-CM

## 2010-10-02 DIAGNOSIS — C8589 Other specified types of non-Hodgkin lymphoma, extranodal and solid organ sites: Secondary | ICD-10-CM

## 2010-10-04 ENCOUNTER — Other Ambulatory Visit: Payer: Self-pay | Admitting: *Deleted

## 2010-10-04 DIAGNOSIS — A310 Pulmonary mycobacterial infection: Secondary | ICD-10-CM

## 2010-10-04 MED ORDER — AZITHROMYCIN 600 MG PO TABS: 1200.0000 mg | ORAL_TABLET | ORAL | Status: DC

## 2010-10-04 NOTE — Telephone Encounter (Signed)
refill 

## 2010-10-11 ENCOUNTER — Ambulatory Visit: Payer: 59 | Admitting: Infectious Disease

## 2010-10-13 ENCOUNTER — Ambulatory Visit (INDEPENDENT_AMBULATORY_CARE_PROVIDER_SITE_OTHER): Payer: 59 | Admitting: Infectious Disease

## 2010-10-13 ENCOUNTER — Encounter: Payer: Self-pay | Admitting: Infectious Disease

## 2010-10-13 VITALS — BP 114/75 | HR 79 | Temp 98.2°F | Ht 69.0 in | Wt 168.0 lb

## 2010-10-13 DIAGNOSIS — C8589 Other specified types of non-Hodgkin lymphoma, extranodal and solid organ sites: Secondary | ICD-10-CM

## 2010-10-13 DIAGNOSIS — K5900 Constipation, unspecified: Secondary | ICD-10-CM | POA: Insufficient documentation

## 2010-10-13 DIAGNOSIS — G971 Other reaction to spinal and lumbar puncture: Secondary | ICD-10-CM | POA: Insufficient documentation

## 2010-10-13 DIAGNOSIS — R197 Diarrhea, unspecified: Secondary | ICD-10-CM

## 2010-10-13 DIAGNOSIS — B20 Human immunodeficiency virus [HIV] disease: Secondary | ICD-10-CM

## 2010-10-13 NOTE — Assessment & Plan Note (Signed)
His headache sounds consistent with a post lumbar puncture headache. If this happens again he could get a blood patch.

## 2010-10-13 NOTE — Assessment & Plan Note (Signed)
As mentioned above he is having some trouble with constipation now I recommended him using some Docusate to soften his stools.

## 2010-10-13 NOTE — Assessment & Plan Note (Signed)
Continue spell IsENT RESS and Truvada. Continue atovaquone for PCP prophylaxis. We can likely do away with azithromycin prophylaxis shortly. With regards to antifungal therapy fluconazole be a reasonable drug to to prophylax him against candida although I don't always do this for my patients with HIV and less they're getting recurrent oral candida L. infections. This might that once daily for condylomata more convenient than 4 times daily clotrimazole.

## 2010-10-13 NOTE — Assessment & Plan Note (Signed)
This seems to have largely resolved and he is tolerating atovaquone and azithromycin at present without diarrhea.
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
## 2010-10-13 NOTE — Assessment & Plan Note (Signed)
Under the care o dr Cyndie Chime. He also getting intrathecal methotrexate. 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 He is going to have re\re imaging done this June to restage his lymphoma.

## 2010-10-14 ENCOUNTER — Other Ambulatory Visit: Payer: Self-pay | Admitting: Oncology

## 2010-10-14 ENCOUNTER — Encounter (HOSPITAL_BASED_OUTPATIENT_CLINIC_OR_DEPARTMENT_OTHER): Payer: 59 | Admitting: Oncology

## 2010-10-14 DIAGNOSIS — C8588 Other specified types of non-Hodgkin lymphoma, lymph nodes of multiple sites: Secondary | ICD-10-CM

## 2010-10-14 DIAGNOSIS — C8590 Non-Hodgkin lymphoma, unspecified, unspecified site: Secondary | ICD-10-CM

## 2010-10-14 DIAGNOSIS — Z5189 Encounter for other specified aftercare: Secondary | ICD-10-CM

## 2010-10-14 DIAGNOSIS — C8589 Other specified types of non-Hodgkin lymphoma, extranodal and solid organ sites: Secondary | ICD-10-CM

## 2010-10-14 DIAGNOSIS — B20 Human immunodeficiency virus [HIV] disease: Secondary | ICD-10-CM

## 2010-10-14 LAB — COMPREHENSIVE METABOLIC PANEL
ALT: 23 U/L (ref 0–53)
AST: 15 U/L (ref 0–37)
Albumin: 4.1 g/dL (ref 3.5–5.2)
Alkaline Phosphatase: 84 U/L (ref 39–117)
Glucose, Bld: 88 mg/dL (ref 70–99)
Potassium: 3.5 mEq/L (ref 3.5–5.3)
Sodium: 136 mEq/L (ref 135–145)
Total Bilirubin: 0.3 mg/dL (ref 0.3–1.2)
Total Protein: 6.3 g/dL (ref 6.0–8.3)

## 2010-10-14 LAB — CBC WITH DIFFERENTIAL/PLATELET
BASO%: 0.3 % (ref 0.0–2.0)
EOS%: 1.2 % (ref 0.0–7.0)
MCH: 34.5 pg — ABNORMAL HIGH (ref 27.2–33.4)
MCHC: 34.7 g/dL (ref 32.0–36.0)
MCV: 99.3 fL — ABNORMAL HIGH (ref 79.3–98.0)
MONO%: 5.5 % (ref 0.0–14.0)
NEUT%: 86 % — ABNORMAL HIGH (ref 39.0–75.0)
RDW: 18.8 % — ABNORMAL HIGH (ref 11.0–14.6)
lymph#: 0.5 10*3/uL — ABNORMAL LOW (ref 0.9–3.3)

## 2010-10-14 LAB — MORPHOLOGY: PLT EST: DECREASED

## 2010-10-15 NOTE — Telephone Encounter (Signed)
He has restarted and is doing fine

## 2010-10-19 NOTE — Op Note (Signed)
NAMEMUSTAPHA, COLSON            ACCOUNT NO.:  [id number]   MEDICAL RECORD NO.:  [id number]          PATIENT TYPE:  INP   LOCATION:  5008                         FACILITY:  [facility]   PHYSICIAN:  Kerrin Champagne, M.D.   DATE OF BIRTH:  03/01/1966   DATE OF PROCEDURE:  11/09/2008  DATE OF DISCHARGE:                               OPERATIVE REPORT   PREOPERATIVE DIAGNOSIS:  Left elbow Monteggia fracture with angulated  comminuted proximal ulna fracture, extra-articular with a minimal  nondisplaced radial neck fracture.  Nondisplaced left distal and third  scaphoid fracture transverse nondisplaced.   POSTOPERATIVE DIAGNOSIS:  Left elbow Monteggia fracture with angulated  comminuted proximal ulna fracture, extra-articular with a minimal  nondisplaced radial neck fracture.  Nondisplaced left distal and third  scaphoid fracture transverse nondisplaced.   PROCEDURE:  Open reduction and internal fixation, left proximal ulna  fracture using Synthes anatomic proximal ulna locking plate and screws.  Examination of radial neck and scaphoid fracture under general  anesthesia.   SURGEON:  Kerrin Champagne, MD   ANESTHESIA:  GOT, Dr. Gelene Mink.   ESTIMATED BLOOD LOSS:  Less than 50 mL.   DRAINS:  None.   TOTAL TOURNIQUET TIME:  At 250 mmHg 60 minutes.   BRIEF CLINICAL HISTORY:  A 45 year old male while mowing his yard was  moving backwards and tripped over his feet on a very low slope following  backwards landing on his neighbors concrete driveway.  His left arm was  behind him to block his fall, falling on his outstretched hand behind  him, he sustained left elbow injury and complains of left wrist pain.  Seen at [facility] and x-rays of his elbow demonstrating  Monteggia fracture with relatively nondisplaced radial neck fracture  comminuted angulated ulna fracture.  Referred to the emergency room at  [facility] where he underwent x-ray evaluation of his wrist.  This  demonstrated a  nondisplaced transverse fracture of the distal one-third  pole of his scaphoid.  He is advised as to the nature of the injury and  indications that this should be treated conservatively with casting  primarily and a good chance of healing; however, there is also risk of  nonunion of this fracture type or delayed union.  Ulnar fracture was  felt to necessitate fixation, radial neck fracture in good position  alignment felt to be able to be treated conservatively without internal  fixation.  After describing the risks and benefits of surgery, the  patient accepts the risks of surgery and was brought to the operating  room to undergo open reduction and internal fixation of the ulna  fracture with evaluation of the radial head and neck fracture and  scaphoid fracture.  Application of long-arm splint and a thumb spica  type splint.   INTRAOPERATIVE FINDINGS:  Comminuted proximal ulna fracture affixed with  the anatomic plate good position alignment.   DESCRIPTION OF PROCEDURE:  After adequate general anesthesia, the  patient was in supine position.  No arm table was used.  Left arm  brought over the chest during the surgery for the surgical procedure.  Tourniquet  about the left upper arm draped in the usual manner following  prep with DuraPrep solution from fingertips to the left upper arm.  Standard preoperative antibiotics, Ancef 2 g.  Preop holding area had  marking of the correct extremity.  Intraoperative time-out indicating  fracture type, fracture side per  expected procedure complications.  Surgery was then undertaken.  Incision on the left elbow after first  elevating, exsanguinated the arm with Esmarch bandage, tourniquet  inflated to 250 mmHg.  Incision along the superficial border of the ulna  over length about 12-14 cm curved to the ulnar side of the olecranon  process.  Through the skin and subcutaneous layers directly down to the  superficial aspect of the bone between the  dorsal and volar compartments  incising down periosteum.  Subperiosteal dissection then carried both  medial and lateral using wooden handle periosteal elevator.  Proximal  ulna plate was carefully examined.  The short plate was felt to be most  appropriate for use of the lag screws proximally as well as distal DCP  plate.  After exposure of the fracture site using the approach to the  subcutaneous ulna, fracture was opened, irrigated, debrided in any  interposed soft tissue.  Then reduced using two lobster claw bone  holding forceps.  Anatomic plate had removal of the most proximal the  screw holes as it was felt not to be necessary for fixation.  Plate was  then approximated to the proximal ulna positioned in anatomic position  alignment and pinned into place using two 0625 K-wires observed on mini  C-arm to be in excellent position alignment.  Plate then had fixation  initially using the most proximal of the screw holes.  Drilling hole  just deep to the olecranon fossa or trochanter fossa and then measuring  the depth of 55 mm length.  Cancellous screw was used for drilling with  this 2.5 drill bed.  A fully threaded cancellus 3.5 screw of 55 mm  length was then used to apply purse fixation here.  Then obtained  excellent purchase on proximal fragment.  Next, the two rows of more  proximal locking screws were then placed again drilling with 2.5 drill  using the sleeves provided placing these screws to a short of the  articular surface of olecranon fossa.  This was done obtaining excellent  fixation.  The coronoid process screw was then placed.  A single screw  was placed obtaining excellent fixation across coronoid process to the  plate and then again a locking type screw was used, total of at least  six cortices obtained proximally.  Distal screw holes were then filled  using the DCP mechanism with the plate, placing eccentric hole closer to  the distal portions of the whole.  The  distal three screw holes reached  the drill and eventually measured for depth.  The appropriate size  cortical screw placed obtaining DCP action and compression across the  fracture site especially with the tube proximal of the three distal  screw holes.  Loosening the more proximal one, then obtaining further  DCP with the index more distal screw.  Finally, the last screw hole was  directed more distally and inserted in a neutral position, obtaining  purchase of two cortices for a total of six cortices distally.  Excellent fixation was obtained.  With this then C-arm fluoro views were  obtained in AP and lateral views documenting the internal fixation of  the ulna in anatomic position alignment.  The radial head and neck  appeared to be in excellent position alignment without any signs of  displacement.  Observation of the scaphoid demonstrated no displacement,  nondisplaced scaphoid, distal one-third transverse fracture.  Irrigation  was then carried out and the subcu layers were then approximated with  interrupted 0 Vicryl sutures.  Skin closed with stainless steel staples.  Approximating the dorsal to volar the fascia across the plate in all  areas.  Plate and soft tissue covering in all areas as noted.  Wet-and-  dry then applied.  The Xeroform gauze, 4x4s fixed the skin with ABD and  sterile Webril leaving padding out of the volar aspect and cubital area.  A well-padded posterior splint was then applied from mid metacarpal  level through the upper arm posteriorly and a thumb splint applied with  radial gutter in order to stabilize the patient's scaphoid fracture.  This was well-padded, held in place with Ace wraps.  Tourniquet was  released.  Total tourniquet time was 60 minutes with 250 mmHg.  The  patient was then reactivated, extubated, placed into a sling, and  returned to the recovery room in satisfactory condition.  No  complications.      Kerrin Champagne, M.D.   Electronically Signed     JEN/MEDQ  D:  11/09/2008  T:  11/10/2008  Job:  [id number]

## 2010-10-19 NOTE — Op Note (Signed)
NAMEGIACOMO, [name]            ACCOUNT NO.:  [id number]   MEDICAL RECORD NO.:  [id number]          PATIENT TYPE:  AMB   LOCATION:  DSC                          FACILITY:  [facility]   PHYSICIAN:  Kerrin Champagne, M.D.   DATE OF BIRTH:  [birth year]   DATE OF PROCEDURE:  01/09/2009  DATE OF DISCHARGE:                               OPERATIVE REPORT   PREOPERATIVE DIAGNOSIS:  Hardware impingement, left elbow radioulnar and  ulnar-trochlear joints, 3 screws of proximal ulna olecranon locking  plate.   POSTOPERATIVE DIAGNOSIS:  Hardware impingement, left elbow radioulnar  and ulnar-trochlear joints, 3 screws of proximal ulna olecranon locking  plate.   PROCEDURE:  Removal of 3 screws, left olecranon locking plate with  exchange of a single screw for a shorter length, the mid area of the  olecranon process sulcus.   SURGEON:  Kerrin Champagne, MD   ANESTHESIA:  General via gastroesophageal obturator, Dr. Krista Blue.   FINDINGS:  Intra-articular screws, left elbow.  One in the radioulnar  joint, two to the ulna-trochlear area.   SPECIMENS:  Three screws given to the patient.   ESTIMATED BLOOD LOSS:  Less than 5 mL.   COMPLICATIONS:  None.   TOTAL TOURNIQUET TIME:  At 250 mmHg for 30 minutes.  The patient  returned to the PACU in good condition.   HISTORY OF PRESENT ILLNESS:  This is a 45 year old right-hand dominant  male who fell backwards while mowing his yard on an inclined and landed  with his left arm behind him onto a concrete driveway.  He was seen in  the emergency room at [facility] on November 09, 2008, and found to have the  left Monteggia fracture with a nondisplaced fracture of the radial neck  and a comminuted fracture of the ulna was angulated, Monteggia type.  He  was brought to the operating room to undergo open reduction and internal  fixation of the ulna with plate and screws using a Synthes locking plate  for the olecranon process.  He tolerated the procedure well.  Intraoperatively, screws position and alignment appeared to be quite  adequate with full range of motion of the elbow with supination and  pronation.  Over time, the patient has developed grading at the  radioulnar joint proximally and underwent a CT scan, which demonstrated  screw approximating the radioulnar joint and likely inhibiting range of  motion here.  Also 2 or 3 screws near the olecranon fossa then appeared  to be possibly impinging on the joint line.  He is brought to the  operating room to undergo removal of the screws.  The patient was  described the risks of infection, bleeding, and neurologic compromise.   DESCRIPTION OF PROCEDURE:  In preop holding area, the patient underwent  standard marking of the extremity to have surgery performed in standard  fashion marking with initials, an X, left arm.  He has ipsilateral left  nondisplaced scaphoid fracture.  CT of this area demonstrated healing of  this fracture, but he is still wearing splint up to 12 weeks post  injury.  The patient was then taken  to the operating room and standard  preoperative antibiotics of Ancef, allergy to SULFA medicine.  Standard  time-out was carried out.  The patient identified the site of procedure,  expected procedure before removal of hardware, left elbow.  The patient  then had prepped the left upper arm to the left wrist with DuraPrep  solution, impervious stockinette about the hand, draped in the usual  manner.  Tourniquet about the left upper arm.  The left arm was  elevated, exsanguinated with Esmarch bandage, tourniquet inflated, 250  mmHg.  The arm brought under mini C-arm and identification made of the  screw, found to be the third row screws from the proximal end of the  elbow plate, not counting the cancellus screws that was intermedullary.  The third row screw appeared to be the screw entering the radioulnar  joint.  This was identified and mark made on the skin overlying the  incision  site.  Incision was made between this screw and the screws  within the midportion of the deepest portion of the sulcus of the  olecranon process before the trochlea sulcus screws.  The incision  approximately 1/2 inch in length allowing for access to this screw  within the radioulnar joint and screw at the deepest portion of the  sulcus.  Screw was engaged after visualization using loupe  magnification.  Note:  The incision was made directly down to the plate.  A Freer elevator used to elevate the subcu layers over the plate  identifying the screw.  The small Star screwdriver was then attached to  the end of the screw and the screw was then easily backed out, observed  on a C-arm to be the correct screw that had been engaging the proximal  radioulnar joint.  Following its removal, supination and pronation  demonstrated smooth range of motion of this joint.  No gradient present.   The incision site was then further developed proximally to identify the  screws of the sulcus base.  First screw removed, was the longer of the  two screws at this level and it was removed without difficulty.  This  was over the radial side of the plate at the base of the sulcus.  The  second screw removed was over the ulnar side of the plate at the sulcus.  These were then checked further at length.  The initial screw was longer  than the second screw.  The second screw was then replaced into the hole  for the first or initial screw in order to obtain continued fixation at  this level.  With this then, attention was turned to the third, the  final screw at the most proximal portion of the ulna.  This particular  screws appeared to be impinging upon the ulnar aspect of the olecranon  process proximally.  It was identified on C-arm fluoro and again marked  made over the skin incision directly over the expected head of the  screw.  A stab incision made about a quarter inch in length down to the  plate identifying  the screw head again with C-arm, and then attaching a  Star screwdriver to the head backing this screw out under C-arm fluoro,  ensuring that it was a correct screw throughout.  Following this then,  irrigation was carried out.  Permanent images obtained in AP and lateral  planes demonstrated the screws in good position and alignment.  There  was adequate fixation of the proximal fragment with the remaining 3  screws to allow for the previous screws to be removed.  Irrigation was  carried out.  Then, each of the incisions were closed with interrupted  subcu sutures of 3-0 Vicryl.  Dermabond applied, 4 x 4's, ABD pad fixed  to the skin with a 3-inch Coban.  The patient then had tourniquet  release.  Total tourniquet time was 30 minutes.  The patient was then  reactivated, extubated, and returned to the recovery room in  satisfactory condition.  All instruments and sponge counts were correct.   Postoperative care, the patient elevate above his heart for a period of  1 day.  Use ice locally for 24 hours two hours on and half an hour off.  Remove his dressing 4 days postoperatively and began bathing with no  scrubbing of this area.  He will be seen back in the office in 2 weeks  for followup.  Percocet for discomfort #30 one every 4-6 hours p.r.n.  pain.      Kerrin Champagne, M.D.  Electronically Signed     JEN/MEDQ  D:  01/09/2009  T:  01/09/2009  Job:  [id number]

## 2010-10-20 ENCOUNTER — Ambulatory Visit (HOSPITAL_COMMUNITY)
Admission: RE | Admit: 2010-10-20 | Discharge: 2010-10-20 | Disposition: A | Discharge status: Home / Self Care | Payer: 59 | Source: Ambulatory Visit | Attending: Oncology | Admitting: Oncology

## 2010-10-20 ENCOUNTER — Other Ambulatory Visit (HOSPITAL_COMMUNITY): Payer: 59

## 2010-10-20 ENCOUNTER — Encounter (HOSPITAL_COMMUNITY): Payer: 59 | Attending: Oncology

## 2010-10-20 DIAGNOSIS — C8590 Non-Hodgkin lymphoma, unspecified, unspecified site: Secondary | ICD-10-CM

## 2010-10-20 DIAGNOSIS — C8589 Other specified types of non-Hodgkin lymphoma, extranodal and solid organ sites: Secondary | ICD-10-CM | POA: Insufficient documentation

## 2010-10-22 ENCOUNTER — Other Ambulatory Visit: Payer: Self-pay | Admitting: Oncology

## 2010-10-22 ENCOUNTER — Other Ambulatory Visit (HOSPITAL_COMMUNITY): Payer: 59

## 2010-10-22 ENCOUNTER — Encounter: Payer: Self-pay | Admitting: Infectious Disease

## 2010-10-22 ENCOUNTER — Encounter (HOSPITAL_BASED_OUTPATIENT_CLINIC_OR_DEPARTMENT_OTHER): Payer: 59 | Admitting: Oncology

## 2010-10-22 DIAGNOSIS — C8589 Other specified types of non-Hodgkin lymphoma, extranodal and solid organ sites: Secondary | ICD-10-CM

## 2010-10-22 DIAGNOSIS — C8588 Other specified types of non-Hodgkin lymphoma, lymph nodes of multiple sites: Secondary | ICD-10-CM

## 2010-10-22 DIAGNOSIS — B20 Human immunodeficiency virus [HIV] disease: Secondary | ICD-10-CM

## 2010-10-22 DIAGNOSIS — Z5111 Encounter for antineoplastic chemotherapy: Secondary | ICD-10-CM

## 2010-10-22 DIAGNOSIS — Z5189 Encounter for other specified aftercare: Secondary | ICD-10-CM

## 2010-10-22 LAB — CBC WITH DIFFERENTIAL/PLATELET
Basophils Absolute: 0.1 10*3/uL (ref 0.0–0.1)
EOS%: 0.7 % (ref 0.0–7.0)
Eosinophils Absolute: 0 10*3/uL (ref 0.0–0.5)
HGB: 11.1 g/dL — ABNORMAL LOW (ref 13.0–17.1)
LYMPH%: 13.9 % — ABNORMAL LOW (ref 14.0–49.0)
MCH: 32.7 pg (ref 27.2–33.4)
MCV: 98.5 fL — ABNORMAL HIGH (ref 79.3–98.0)
MONO%: 7 % (ref 0.0–14.0)
Platelets: 199 10*3/uL (ref 140–400)
RBC: 3.39 10*6/uL — ABNORMAL LOW (ref 4.20–5.82)
RDW: 18.1 % — ABNORMAL HIGH (ref 11.0–14.6)

## 2010-10-23 ENCOUNTER — Encounter (HOSPITAL_BASED_OUTPATIENT_CLINIC_OR_DEPARTMENT_OTHER): Payer: 59 | Admitting: Oncology

## 2010-10-23 DIAGNOSIS — Z5189 Encounter for other specified aftercare: Secondary | ICD-10-CM

## 2010-10-23 DIAGNOSIS — C8589 Other specified types of non-Hodgkin lymphoma, extranodal and solid organ sites: Secondary | ICD-10-CM

## 2010-10-30 ENCOUNTER — Other Ambulatory Visit: Payer: Self-pay | Admitting: Infectious Disease

## 2010-11-02 ENCOUNTER — Other Ambulatory Visit: Payer: Self-pay | Admitting: Licensed Clinical Social Worker

## 2010-11-02 DIAGNOSIS — A310 Pulmonary mycobacterial infection: Secondary | ICD-10-CM

## 2010-11-02 MED ORDER — AZITHROMYCIN 600 MG PO TABS: 1200.0000 mg | ORAL_TABLET | ORAL | Status: DC

## 2010-11-04 ENCOUNTER — Encounter (HOSPITAL_BASED_OUTPATIENT_CLINIC_OR_DEPARTMENT_OTHER): Payer: 59 | Admitting: Oncology

## 2010-11-04 ENCOUNTER — Other Ambulatory Visit: Payer: Self-pay | Admitting: Oncology

## 2010-11-04 DIAGNOSIS — C8589 Other specified types of non-Hodgkin lymphoma, extranodal and solid organ sites: Secondary | ICD-10-CM

## 2010-11-04 DIAGNOSIS — Z5189 Encounter for other specified aftercare: Secondary | ICD-10-CM

## 2010-11-04 DIAGNOSIS — C8588 Other specified types of non-Hodgkin lymphoma, lymph nodes of multiple sites: Secondary | ICD-10-CM

## 2010-11-04 DIAGNOSIS — B20 Human immunodeficiency virus [HIV] disease: Secondary | ICD-10-CM

## 2010-11-04 LAB — MORPHOLOGY

## 2010-11-04 LAB — CBC WITH DIFFERENTIAL/PLATELET
BASO%: 0.2 % (ref 0.0–2.0)
EOS%: 1 % (ref 0.0–7.0)
Eosinophils Absolute: 0.1 10*3/uL (ref 0.0–0.5)
LYMPH%: 7.6 % — ABNORMAL LOW (ref 14.0–49.0)
MCH: 34.9 pg — ABNORMAL HIGH (ref 27.2–33.4)
MCHC: 34.3 g/dL (ref 32.0–36.0)
MCV: 101.8 fL — ABNORMAL HIGH (ref 79.3–98.0)
MONO%: 1.3 % (ref 0.0–14.0)
NEUT#: 5.1 10*3/uL (ref 1.5–6.5)
Platelets: 63 10*3/uL — ABNORMAL LOW (ref 140–400)
RBC: 3.36 10*6/uL — ABNORMAL LOW (ref 4.20–5.82)
RDW: 15.4 % — ABNORMAL HIGH (ref 11.0–14.6)

## 2010-11-04 LAB — COMPREHENSIVE METABOLIC PANEL
AST: 17 U/L (ref 0–37)
Albumin: 4 g/dL (ref 3.5–5.2)
BUN: 8 mg/dL (ref 6–23)
CO2: 24 mEq/L (ref 19–32)
Calcium: 9.4 mg/dL (ref 8.4–10.5)
Chloride: 106 mEq/L (ref 96–112)
Creatinine, Ser: 1.02 mg/dL (ref 0.40–1.50)
Glucose, Bld: 122 mg/dL — ABNORMAL HIGH (ref 70–99)
Potassium: 4.1 mEq/L (ref 3.5–5.3)

## 2010-11-04 LAB — URIC ACID: Uric Acid, Serum: 7.1 mg/dL (ref 4.0–7.8)

## 2010-11-04 LAB — LACTATE DEHYDROGENASE: LDH: 179 U/L (ref 94–250)

## 2010-11-10 ENCOUNTER — Other Ambulatory Visit (HOSPITAL_COMMUNITY): Payer: 59

## 2010-11-10 ENCOUNTER — Inpatient Hospital Stay (HOSPITAL_COMMUNITY): Payer: 59

## 2010-11-11 ENCOUNTER — Ambulatory Visit (HOSPITAL_COMMUNITY)
Admission: RE | Admit: 2010-11-11 | Discharge: 2010-11-11 | Disposition: A | Discharge status: Home / Self Care | Payer: 59 | Source: Ambulatory Visit | Attending: Oncology | Admitting: Oncology

## 2010-11-11 ENCOUNTER — Encounter (HOSPITAL_BASED_OUTPATIENT_CLINIC_OR_DEPARTMENT_OTHER): Payer: 59 | Admitting: Oncology

## 2010-11-11 ENCOUNTER — Ambulatory Visit (HOSPITAL_COMMUNITY): Payer: 59

## 2010-11-11 ENCOUNTER — Other Ambulatory Visit: Payer: Self-pay | Admitting: Oncology

## 2010-11-11 DIAGNOSIS — C8590 Non-Hodgkin lymphoma, unspecified, unspecified site: Secondary | ICD-10-CM

## 2010-11-11 DIAGNOSIS — C8588 Other specified types of non-Hodgkin lymphoma, lymph nodes of multiple sites: Secondary | ICD-10-CM

## 2010-11-11 DIAGNOSIS — C8589 Other specified types of non-Hodgkin lymphoma, extranodal and solid organ sites: Secondary | ICD-10-CM | POA: Insufficient documentation

## 2010-11-11 DIAGNOSIS — Z5189 Encounter for other specified aftercare: Secondary | ICD-10-CM

## 2010-11-11 DIAGNOSIS — B20 Human immunodeficiency virus [HIV] disease: Secondary | ICD-10-CM

## 2010-11-11 LAB — CBC WITH DIFFERENTIAL/PLATELET
Basophils Absolute: 0 10*3/uL (ref 0.0–0.1)
EOS%: 0.6 % (ref 0.0–7.0)
Eosinophils Absolute: 0 10*3/uL (ref 0.0–0.5)
HGB: 12.3 g/dL — ABNORMAL LOW (ref 13.0–17.1)
MCH: 35 pg — ABNORMAL HIGH (ref 27.2–33.4)
MONO#: 0.4 10*3/uL (ref 0.1–0.9)
NEUT#: 4.8 10*3/uL (ref 1.5–6.5)
RDW: 15.7 % — ABNORMAL HIGH (ref 11.0–14.6)
WBC: 5.9 10*3/uL (ref 4.0–10.3)
lymph#: 0.6 10*3/uL — ABNORMAL LOW (ref 0.9–3.3)

## 2010-11-12 ENCOUNTER — Other Ambulatory Visit (HOSPITAL_COMMUNITY): Payer: 59

## 2010-11-12 ENCOUNTER — Encounter (HOSPITAL_BASED_OUTPATIENT_CLINIC_OR_DEPARTMENT_OTHER): Payer: 59 | Admitting: Oncology

## 2010-11-12 ENCOUNTER — Encounter: Payer: 59 | Admitting: Oncology

## 2010-11-12 DIAGNOSIS — Z5111 Encounter for antineoplastic chemotherapy: Secondary | ICD-10-CM

## 2010-11-12 DIAGNOSIS — B20 Human immunodeficiency virus [HIV] disease: Secondary | ICD-10-CM

## 2010-11-12 DIAGNOSIS — C8589 Other specified types of non-Hodgkin lymphoma, extranodal and solid organ sites: Secondary | ICD-10-CM

## 2010-11-13 ENCOUNTER — Encounter (HOSPITAL_BASED_OUTPATIENT_CLINIC_OR_DEPARTMENT_OTHER): Payer: 59 | Admitting: Oncology

## 2010-11-13 DIAGNOSIS — Z5189 Encounter for other specified aftercare: Secondary | ICD-10-CM

## 2010-11-13 DIAGNOSIS — C8589 Other specified types of non-Hodgkin lymphoma, extranodal and solid organ sites: Secondary | ICD-10-CM

## 2010-11-13 DIAGNOSIS — B20 Human immunodeficiency virus [HIV] disease: Secondary | ICD-10-CM

## 2010-11-17 ENCOUNTER — Other Ambulatory Visit: Payer: Self-pay | Admitting: Oncology

## 2010-11-17 ENCOUNTER — Encounter (HOSPITAL_COMMUNITY)
Admission: RE | Admit: 2010-11-17 | Discharge: 2010-11-17 | Disposition: A | Discharge status: Home / Self Care | Payer: 59 | Source: Ambulatory Visit | Attending: Oncology | Admitting: Oncology

## 2010-11-17 ENCOUNTER — Encounter (HOSPITAL_COMMUNITY): Payer: Self-pay

## 2010-11-17 ENCOUNTER — Encounter (HOSPITAL_BASED_OUTPATIENT_CLINIC_OR_DEPARTMENT_OTHER): Payer: 59 | Admitting: Oncology

## 2010-11-17 DIAGNOSIS — Z5189 Encounter for other specified aftercare: Secondary | ICD-10-CM

## 2010-11-17 DIAGNOSIS — C8588 Other specified types of non-Hodgkin lymphoma, lymph nodes of multiple sites: Secondary | ICD-10-CM

## 2010-11-17 DIAGNOSIS — Z79899 Other long term (current) drug therapy: Secondary | ICD-10-CM | POA: Insufficient documentation

## 2010-11-17 DIAGNOSIS — B20 Human immunodeficiency virus [HIV] disease: Secondary | ICD-10-CM

## 2010-11-17 DIAGNOSIS — C8590 Non-Hodgkin lymphoma, unspecified, unspecified site: Secondary | ICD-10-CM

## 2010-11-17 DIAGNOSIS — C8589 Other specified types of non-Hodgkin lymphoma, extranodal and solid organ sites: Secondary | ICD-10-CM | POA: Insufficient documentation

## 2010-11-17 LAB — MORPHOLOGY

## 2010-11-17 LAB — CMP (CANCER CENTER ONLY)
Alkaline Phosphatase: 110 U/L — ABNORMAL HIGH (ref 26–84)
BUN, Bld: 24 mg/dL — ABNORMAL HIGH (ref 7–22)
Creat: 0.8 mg/dl (ref 0.6–1.2)
Glucose, Bld: 95 mg/dL (ref 73–118)
Total Bilirubin: 0.9 mg/dl (ref 0.20–1.60)

## 2010-11-17 LAB — CBC WITH DIFFERENTIAL/PLATELET
Basophils Absolute: 0 10*3/uL (ref 0.0–0.1)
Eosinophils Absolute: 0 10*3/uL (ref 0.0–0.5)
HCT: 34.9 % — ABNORMAL LOW (ref 38.4–49.9)
HGB: 11.7 g/dL — ABNORMAL LOW (ref 13.0–17.1)
MCV: 100 fL — ABNORMAL HIGH (ref 79.3–98.0)
MONO%: 0.5 % (ref 0.0–14.0)
NEUT#: 7.2 10*3/uL — ABNORMAL HIGH (ref 1.5–6.5)
RDW: 14.9 % — ABNORMAL HIGH (ref 11.0–14.6)

## 2010-11-17 LAB — URIC ACID: Uric Acid, Serum: 3.9 mg/dL — ABNORMAL LOW (ref 4.0–7.8)

## 2010-11-17 MED ORDER — IOHEXOL 300 MG/ML  SOLN
100.0000 mL | Freq: Once | INTRAMUSCULAR | Status: AC | PRN
  Administered 2010-11-17: 100 mL via INTRAVENOUS

## 2010-11-17 MED ORDER — FLUDEOXYGLUCOSE F - 18 (FDG) INJECTION
17.7000 | Freq: Once | INTRAVENOUS | Status: AC | PRN
  Administered 2010-11-17: 17.7 via INTRAVENOUS

## 2010-11-18 ENCOUNTER — Ambulatory Visit (HOSPITAL_COMMUNITY)
Admission: RE | Admit: 2010-11-18 | Discharge: 2010-11-18 | Disposition: A | Discharge status: Home / Self Care | Payer: 59 | Source: Ambulatory Visit | Attending: Oncology | Admitting: Oncology

## 2010-11-18 DIAGNOSIS — Z5111 Encounter for antineoplastic chemotherapy: Secondary | ICD-10-CM

## 2010-11-18 DIAGNOSIS — Z87891 Personal history of nicotine dependence: Secondary | ICD-10-CM | POA: Insufficient documentation

## 2010-11-18 DIAGNOSIS — Z01818 Encounter for other preprocedural examination: Secondary | ICD-10-CM | POA: Insufficient documentation

## 2010-11-22 ENCOUNTER — Encounter (HOSPITAL_BASED_OUTPATIENT_CLINIC_OR_DEPARTMENT_OTHER): Payer: 59 | Admitting: Oncology

## 2010-11-22 ENCOUNTER — Other Ambulatory Visit: Payer: Self-pay | Admitting: Oncology

## 2010-11-22 DIAGNOSIS — C8589 Other specified types of non-Hodgkin lymphoma, extranodal and solid organ sites: Secondary | ICD-10-CM

## 2010-11-22 DIAGNOSIS — B20 Human immunodeficiency virus [HIV] disease: Secondary | ICD-10-CM

## 2010-11-22 DIAGNOSIS — C8590 Non-Hodgkin lymphoma, unspecified, unspecified site: Secondary | ICD-10-CM

## 2010-12-01 ENCOUNTER — Other Ambulatory Visit: Payer: Self-pay | Admitting: Infectious Disease

## 2010-12-01 ENCOUNTER — Ambulatory Visit (HOSPITAL_COMMUNITY): Payer: 59

## 2010-12-01 ENCOUNTER — Ambulatory Visit (HOSPITAL_COMMUNITY)
Admission: RE | Admit: 2010-12-01 | Discharge: 2010-12-01 | Disposition: A | Discharge status: Home / Self Care | Payer: 59 | Source: Ambulatory Visit | Attending: Oncology | Admitting: Oncology

## 2010-12-01 ENCOUNTER — Other Ambulatory Visit: Payer: Self-pay | Admitting: Oncology

## 2010-12-01 ENCOUNTER — Other Ambulatory Visit: Payer: 59

## 2010-12-01 DIAGNOSIS — B20 Human immunodeficiency virus [HIV] disease: Secondary | ICD-10-CM

## 2010-12-01 DIAGNOSIS — C8589 Other specified types of non-Hodgkin lymphoma, extranodal and solid organ sites: Secondary | ICD-10-CM | POA: Insufficient documentation

## 2010-12-01 DIAGNOSIS — C8590 Non-Hodgkin lymphoma, unspecified, unspecified site: Secondary | ICD-10-CM

## 2010-12-01 LAB — COMPREHENSIVE METABOLIC PANEL
Albumin: 4.2 g/dL (ref 3.5–5.2)
CO2: 23 mEq/L (ref 19–32)
Calcium: 9.4 mg/dL (ref 8.4–10.5)
Glucose, Bld: 76 mg/dL (ref 70–99)
Sodium: 142 mEq/L (ref 135–145)
Total Bilirubin: 0.3 mg/dL (ref 0.3–1.2)
Total Protein: 6.7 g/dL (ref 6.0–8.3)

## 2010-12-02 ENCOUNTER — Other Ambulatory Visit (HOSPITAL_COMMUNITY): Payer: 59

## 2010-12-02 LAB — HIV-1 RNA QUANT-NO REFLEX-BLD
HIV 1 RNA Quant: 103 copies/mL — ABNORMAL HIGH (ref <20)
HIV-1 RNA Quant, Log: 2.01 {Log} — ABNORMAL HIGH (ref <1.30)

## 2010-12-02 LAB — CBC WITH DIFFERENTIAL/PLATELET
Eosinophils Absolute: 0 10*3/uL (ref 0.0–0.7)
Hemoglobin: 12.8 g/dL — ABNORMAL LOW (ref 13.0–17.0)
Lymphs Abs: 0.6 10*3/uL — ABNORMAL LOW (ref 0.7–4.0)
MCH: 34.1 pg — ABNORMAL HIGH (ref 26.0–34.0)
MCV: 106.7 fL — ABNORMAL HIGH (ref 78.0–100.0)
Monocytes Relative: 6 % (ref 3–12)
Neutrophils Relative %: 84 % — ABNORMAL HIGH (ref 43–77)
RBC: 3.75 MIL/uL — ABNORMAL LOW (ref 4.22–5.81)

## 2010-12-03 ENCOUNTER — Encounter: Payer: 59 | Admitting: Oncology

## 2010-12-03 ENCOUNTER — Other Ambulatory Visit (HOSPITAL_COMMUNITY): Payer: 59

## 2010-12-10 ENCOUNTER — Other Ambulatory Visit (HOSPITAL_COMMUNITY): Payer: 59

## 2010-12-15 ENCOUNTER — Ambulatory Visit (INDEPENDENT_AMBULATORY_CARE_PROVIDER_SITE_OTHER): Payer: 59 | Admitting: Infectious Disease

## 2010-12-15 ENCOUNTER — Encounter: Payer: Self-pay | Admitting: Infectious Disease

## 2010-12-15 VITALS — BP 122/78 | HR 69 | Temp 98.4°F | Wt 180.2 lb

## 2010-12-15 DIAGNOSIS — R197 Diarrhea, unspecified: Secondary | ICD-10-CM

## 2010-12-15 DIAGNOSIS — B20 Human immunodeficiency virus [HIV] disease: Secondary | ICD-10-CM

## 2010-12-15 DIAGNOSIS — Z23 Encounter for immunization: Secondary | ICD-10-CM

## 2010-12-15 DIAGNOSIS — C8589 Other specified types of non-Hodgkin lymphoma, extranodal and solid organ sites: Secondary | ICD-10-CM

## 2010-12-15 MED ORDER — ALPRAZOLAM 0.5 MG PO TABS: 0.5000 mg | ORAL_TABLET | Freq: Two times a day (BID) | ORAL | Status: DC | PRN

## 2010-12-15 NOTE — Assessment & Plan Note (Signed)
Resolved

## 2010-12-15 NOTE — Assessment & Plan Note (Signed)
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

## 2010-12-15 NOTE — Progress Notes (Signed)
  Subjective:    Patient ID: [name], male    DOB: Apr 08, 1966, 45 y.o.   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 Cyndie Chime. 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 His viral load is 100 his CD4 count remains low at 60. He is feeling much better he has gained weight his diarrhea is gone. His PET scan showed no obvious residual disease other than an area near his right parotid gland which is going to be reevaluated by CT scan in August. We spent greater than 45 minutes with the patient including counselling the pt and in coordinating his care.  Review of Systems  Constitutional: Negative for fever, chills, diaphoresis, activity change, appetite change, fatigue and unexpected weight change.  HENT: Negative for congestion, sore throat, rhinorrhea, sneezing, trouble swallowing and sinus pressure.   Eyes: Negative for photophobia and visual disturbance.  Respiratory: Negative for cough, chest tightness, shortness of breath, wheezing and stridor.   Cardiovascular: Negative for chest pain, palpitations and leg swelling.  Gastrointestinal: Negative for nausea, vomiting, abdominal pain, diarrhea, constipation, blood in stool, abdominal distention and anal bleeding.  Genitourinary: Negative for dysuria, hematuria, flank pain and difficulty urinating.  Musculoskeletal: Negative for myalgias, back pain, joint swelling, arthralgias and gait problem.  Skin: Negative for color change, pallor, rash and wound.  Neurological: Negative for dizziness, tremors, weakness and light-headedness.  Hematological: Negative for adenopathy. Does not bruise/bleed easily.  Psychiatric/Behavioral: Negative for behavioral problems, confusion, sleep disturbance, dysphoric  mood, decreased concentration and agitation.       Objective:   Physical Exam  Constitutional: He is oriented to person, place, and time. He appears well-developed and well-nourished. No distress.  HENT:  Head: Normocephalic and atraumatic.  Mouth/Throat: Oropharynx is clear and moist. No oropharyngeal exudate.  Eyes: Conjunctivae and EOM are normal. Pupils are equal, round, and reactive to light. No scleral icterus.  Neck: Normal range of motion. Neck supple. No JVD present.  Cardiovascular: Normal rate, regular rhythm and normal heart sounds.  Exam reveals no gallop and no friction rub.   No murmur heard. Pulmonary/Chest: Effort normal and breath sounds normal. No respiratory distress. He has no wheezes. He has no rales. He exhibits no tenderness.  Abdominal: He exhibits no distension and no mass. There is no tenderness. There is no rebound and no guarding.  Musculoskeletal: He exhibits no edema and no tenderness.  Lymphadenopathy:    He has no cervical adenopathy.  Neurological: He is alert and oriented to person, place, and time. He has normal reflexes. He exhibits normal muscle tone. Coordination normal.  Skin: Skin is warm and dry. He is not diaphoretic. No erythema. No pallor.  Psychiatric: He has a normal mood and affect. His behavior is normal. Judgment and thought content normal.          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  NON-HODGKIN'S LYMPHOMA Clinically in remission PET scan quite encouraging. He'll a repeat CT scan in August.  DIARRHEA Resolved

## 2010-12-15 NOTE — Assessment & Plan Note (Signed)
Clinically in remission PET scan quite encouraging. He'll a repeat CT scan in August.

## 2010-12-29 ENCOUNTER — Encounter (HOSPITAL_BASED_OUTPATIENT_CLINIC_OR_DEPARTMENT_OTHER): Payer: 59 | Admitting: Oncology

## 2010-12-29 DIAGNOSIS — B20 Human immunodeficiency virus [HIV] disease: Secondary | ICD-10-CM

## 2010-12-29 DIAGNOSIS — Z452 Encounter for adjustment and management of vascular access device: Secondary | ICD-10-CM

## 2010-12-29 DIAGNOSIS — C8589 Other specified types of non-Hodgkin lymphoma, extranodal and solid organ sites: Secondary | ICD-10-CM

## 2011-01-10 ENCOUNTER — Encounter (HOSPITAL_BASED_OUTPATIENT_CLINIC_OR_DEPARTMENT_OTHER): Payer: 59 | Admitting: Oncology

## 2011-01-10 ENCOUNTER — Other Ambulatory Visit: Payer: Self-pay | Admitting: Oncology

## 2011-01-10 ENCOUNTER — Ambulatory Visit (HOSPITAL_COMMUNITY)
Admission: RE | Admit: 2011-01-10 | Discharge: 2011-01-10 | Disposition: A | Discharge status: Home / Self Care | Payer: 59 | Source: Ambulatory Visit | Attending: Oncology | Admitting: Oncology

## 2011-01-10 ENCOUNTER — Encounter (HOSPITAL_COMMUNITY): Payer: Self-pay

## 2011-01-10 DIAGNOSIS — Z21 Asymptomatic human immunodeficiency virus [HIV] infection status: Secondary | ICD-10-CM | POA: Insufficient documentation

## 2011-01-10 DIAGNOSIS — C8590 Non-Hodgkin lymphoma, unspecified, unspecified site: Secondary | ICD-10-CM

## 2011-01-10 DIAGNOSIS — J984 Other disorders of lung: Secondary | ICD-10-CM | POA: Insufficient documentation

## 2011-01-10 DIAGNOSIS — C8589 Other specified types of non-Hodgkin lymphoma, extranodal and solid organ sites: Secondary | ICD-10-CM | POA: Insufficient documentation

## 2011-01-10 DIAGNOSIS — C8588 Other specified types of non-Hodgkin lymphoma, lymph nodes of multiple sites: Secondary | ICD-10-CM

## 2011-01-10 LAB — CBC WITH DIFFERENTIAL/PLATELET
BASO%: 0.7 % (ref 0.0–2.0)
LYMPH%: 35.7 % (ref 14.0–49.0)
MCH: 34.5 pg — ABNORMAL HIGH (ref 27.2–33.4)
MCHC: 35.1 g/dL (ref 32.0–36.0)
MCV: 98.2 fL — ABNORMAL HIGH (ref 79.3–98.0)
MONO%: 9.2 % (ref 0.0–14.0)
Platelets: 102 10*3/uL — ABNORMAL LOW (ref 140–400)
RBC: 4.06 10*6/uL — ABNORMAL LOW (ref 4.20–5.82)

## 2011-01-10 LAB — SEDIMENTATION RATE: Sed Rate: 10 mm/hr (ref 0–16)

## 2011-01-10 LAB — CMP (CANCER CENTER ONLY)
ALT(SGPT): 39 U/L (ref 10–47)
Alkaline Phosphatase: 139 U/L — ABNORMAL HIGH (ref 26–84)
Creat: 0.9 mg/dl (ref 0.6–1.2)
Sodium: 140 mEq/L (ref 128–145)
Total Bilirubin: 0.6 mg/dl (ref 0.20–1.60)
Total Protein: 7.1 g/dL (ref 6.4–8.1)

## 2011-01-10 MED ORDER — IOHEXOL 300 MG/ML  SOLN
100.0000 mL | Freq: Once | INTRAMUSCULAR | Status: AC | PRN
  Administered 2011-01-10: 100 mL via INTRAVENOUS

## 2011-01-12 ENCOUNTER — Other Ambulatory Visit: Payer: Self-pay | Admitting: Oncology

## 2011-01-12 ENCOUNTER — Encounter (HOSPITAL_BASED_OUTPATIENT_CLINIC_OR_DEPARTMENT_OTHER): Payer: 59 | Admitting: Oncology

## 2011-01-12 DIAGNOSIS — C8589 Other specified types of non-Hodgkin lymphoma, extranodal and solid organ sites: Secondary | ICD-10-CM

## 2011-01-12 DIAGNOSIS — R21 Rash and other nonspecific skin eruption: Secondary | ICD-10-CM

## 2011-01-12 DIAGNOSIS — B20 Human immunodeficiency virus [HIV] disease: Secondary | ICD-10-CM

## 2011-01-12 LAB — CBC WITH DIFFERENTIAL/PLATELET
BASO%: 3.8 % — ABNORMAL HIGH (ref 0.0–2.0)
MCHC: 35.1 g/dL (ref 32.0–36.0)
MONO#: 0.3 10*3/uL (ref 0.1–0.9)
RBC: 4.18 10*6/uL — ABNORMAL LOW (ref 4.20–5.82)
WBC: 2.7 10*3/uL — ABNORMAL LOW (ref 4.0–10.3)
lymph#: 1 10*3/uL (ref 0.9–3.3)

## 2011-01-17 ENCOUNTER — Encounter (HOSPITAL_BASED_OUTPATIENT_CLINIC_OR_DEPARTMENT_OTHER): Payer: 59 | Admitting: Oncology

## 2011-01-17 ENCOUNTER — Other Ambulatory Visit: Payer: Self-pay | Admitting: Oncology

## 2011-01-17 DIAGNOSIS — C8589 Other specified types of non-Hodgkin lymphoma, extranodal and solid organ sites: Secondary | ICD-10-CM

## 2011-01-17 DIAGNOSIS — B20 Human immunodeficiency virus [HIV] disease: Secondary | ICD-10-CM

## 2011-01-17 LAB — CBC WITH DIFFERENTIAL/PLATELET
BASO%: 1.4 % (ref 0.0–2.0)
Basophils Absolute: 0 10*3/uL (ref 0.0–0.1)
HCT: 40.7 % (ref 38.4–49.9)
HGB: 14.3 g/dL (ref 13.0–17.1)
MONO#: 0.2 10*3/uL (ref 0.1–0.9)
NEUT#: 1.2 10*3/uL — ABNORMAL LOW (ref 1.5–6.5)
NEUT%: 48.7 % (ref 39.0–75.0)
WBC: 2.5 10*3/uL — ABNORMAL LOW (ref 4.0–10.3)
lymph#: 0.9 10*3/uL (ref 0.9–3.3)

## 2011-01-17 LAB — MORPHOLOGY

## 2011-01-19 ENCOUNTER — Other Ambulatory Visit: Payer: 59

## 2011-01-19 ENCOUNTER — Other Ambulatory Visit: Payer: Self-pay | Admitting: Infectious Disease

## 2011-01-19 DIAGNOSIS — B20 Human immunodeficiency virus [HIV] disease: Secondary | ICD-10-CM

## 2011-01-20 LAB — T-HELPER CELL (CD4) - (RCID CLINIC ONLY)
CD4 % Helper T Cell: 16 % — ABNORMAL LOW (ref 33–55)
CD4 T Cell Abs: 160 uL — ABNORMAL LOW (ref 400–2700)

## 2011-01-20 LAB — CBC WITH DIFFERENTIAL/PLATELET
Basophils Absolute: 0 10*3/uL (ref 0.0–0.1)
Eosinophils Absolute: 0.1 10*3/uL (ref 0.0–0.7)
Lymphs Abs: 1.1 10*3/uL (ref 0.7–4.0)
MCH: 32.8 pg (ref 26.0–34.0)
Neutrophils Relative %: 51 % (ref 43–77)
Platelets: 102 10*3/uL — ABNORMAL LOW (ref 150–400)
RBC: 4.36 MIL/uL (ref 4.22–5.81)
WBC: 3 10*3/uL — ABNORMAL LOW (ref 4.0–10.5)

## 2011-01-20 LAB — GC/CHLAMYDIA PROBE AMP, URINE
Chlamydia, Swab/Urine, PCR: NEGATIVE
GC Probe Amp, Urine: NEGATIVE

## 2011-01-20 LAB — COMPLETE METABOLIC PANEL WITH GFR
AST: 31 U/L (ref 0–37)
Alkaline Phosphatase: 142 U/L — ABNORMAL HIGH (ref 39–117)
BUN: 16 mg/dL (ref 6–23)
Creat: 0.93 mg/dL (ref 0.50–1.35)
Potassium: 4 mEq/L (ref 3.5–5.3)
Total Bilirubin: 0.5 mg/dL (ref 0.3–1.2)

## 2011-01-20 LAB — RPR

## 2011-01-24 LAB — HIV-1 RNA QUANT-NO REFLEX-BLD: HIV 1 RNA Quant: <20 copies/mL (ref <20)

## 2011-01-25 ENCOUNTER — Telehealth: Payer: Self-pay | Admitting: *Deleted

## 2011-01-25 NOTE — Telephone Encounter (Signed)
Patient called c/o rectal bleeding off and on for six months.  He has already had a colonoscopy and he was diagnosed with bleeding hemroids, however he says this feels different.  He has an appointment with Dr. Daiva Eves for 02/02/11.  He will call back to be seen by the NP if he feels he can not wait until next week. Wendall Mola CMA

## 2011-02-02 ENCOUNTER — Ambulatory Visit (INDEPENDENT_AMBULATORY_CARE_PROVIDER_SITE_OTHER): Payer: 59 | Admitting: Infectious Disease

## 2011-02-02 ENCOUNTER — Encounter (HOSPITAL_BASED_OUTPATIENT_CLINIC_OR_DEPARTMENT_OTHER): Payer: 59 | Admitting: Oncology

## 2011-02-02 ENCOUNTER — Other Ambulatory Visit: Payer: Self-pay | Admitting: Oncology

## 2011-02-02 DIAGNOSIS — D696 Thrombocytopenia, unspecified: Secondary | ICD-10-CM | POA: Insufficient documentation

## 2011-02-02 DIAGNOSIS — C8589 Other specified types of non-Hodgkin lymphoma, extranodal and solid organ sites: Secondary | ICD-10-CM

## 2011-02-02 DIAGNOSIS — L738 Other specified follicular disorders: Secondary | ICD-10-CM

## 2011-02-02 DIAGNOSIS — B081 Molluscum contagiosum: Secondary | ICD-10-CM

## 2011-02-02 DIAGNOSIS — Z23 Encounter for immunization: Secondary | ICD-10-CM

## 2011-02-02 DIAGNOSIS — L739 Follicular disorder, unspecified: Secondary | ICD-10-CM | POA: Insufficient documentation

## 2011-02-02 DIAGNOSIS — K649 Unspecified hemorrhoids: Secondary | ICD-10-CM

## 2011-02-02 DIAGNOSIS — B20 Human immunodeficiency virus [HIV] disease: Secondary | ICD-10-CM

## 2011-02-02 DIAGNOSIS — K602 Anal fissure, unspecified: Secondary | ICD-10-CM | POA: Insufficient documentation

## 2011-02-02 DIAGNOSIS — D1800 Hemangioma unspecified site: Secondary | ICD-10-CM | POA: Insufficient documentation

## 2011-02-02 LAB — CBC WITH DIFFERENTIAL/PLATELET
BASO%: 0.3 % (ref 0.0–2.0)
EOS%: 2.3 % (ref 0.0–7.0)
MCH: 33.6 pg — ABNORMAL HIGH (ref 27.2–33.4)
MCHC: 34.6 g/dL (ref 32.0–36.0)
MCV: 97.1 fL (ref 79.3–98.0)
MONO%: 6.7 % (ref 0.0–14.0)
RDW: 12.1 % (ref 11.0–14.6)
lymph#: 1 10*3/uL (ref 0.9–3.3)

## 2011-02-02 LAB — MORPHOLOGY

## 2011-02-02 MED ORDER — DOXYCYCLINE HYCLATE 100 MG PO TABS: 100.0000 mg | ORAL_TABLET | Freq: Two times a day (BID) | ORAL | Status: AC

## 2011-02-02 MED ORDER — HYDROCORTISONE ACETATE 25 MG RE SUPP: 25.0000 mg | Freq: Two times a day (BID) | RECTAL | Status: DC

## 2011-02-02 NOTE — Progress Notes (Signed)
Subjective:    Patient ID: [name], male    DOB: November 24, 1965, 45 y.o.   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 Cyndie Chime, on isentress and truvada with perfect virological suppression at last check and cd4 at 160. He has multiple complaints today some of which may be due to a delayed IRIS (in light of chemotherapy being on board as his viral load suppressed, but now off chemo). He has had trouble recovering his platelets post chemotherapy and still under 100. He had a petechial rash that has resolved. He has a very minimal erythema around his hair follicles on his arms. He also is concerned by some lesion on his feet that look like molluscum contagiosum. He is bothered by some new skin lesions on trunk that look like hemnagiomas. Finally he is also concerned by anal pruritis and occasional bleeding.  In all I spent greater than 45 minutes with the pt including greater than 50% of time counselling the pt and in coordinating care.  Review of Systems  Constitutional: Negative for fever, chills, diaphoresis, activity change, appetite change, fatigue and unexpected weight change.  HENT: Negative for congestion, sore throat, rhinorrhea, sneezing, trouble swallowing and sinus pressure.   Eyes: Negative for photophobia and visual disturbance.  Respiratory: Negative for cough, chest tightness, shortness of breath, wheezing and stridor.   Cardiovascular: Negative for chest pain, palpitations and leg swelling.  Gastrointestinal: Positive for anal bleeding. Negative for nausea, vomiting, abdominal pain, diarrhea, constipation, blood in stool and abdominal distention.  Genitourinary: Negative for dysuria, hematuria, flank pain and difficulty urinating.  Musculoskeletal: Negative for myalgias, back pain, joint swelling, arthralgias and gait problem.  Skin: Positive for rash. Negative for color change, pallor and wound.    Neurological: Negative for dizziness, tremors, weakness and light-headedness.  Hematological: Negative for adenopathy. Does not bruise/bleed easily.  Psychiatric/Behavioral: Negative for behavioral problems, confusion, sleep disturbance, dysphoric mood, decreased concentration and agitation.       Objective:   Physical Exam  Constitutional: He is oriented to person, place, and time. He appears well-developed and well-nourished. No distress.  HENT:  Head: Normocephalic and atraumatic.  Mouth/Throat: Oropharynx is clear and moist. No oropharyngeal exudate.  Eyes: Conjunctivae and EOM are normal. Pupils are equal, round, and reactive to light. No scleral icterus.  Neck: Normal range of motion. Neck supple. No JVD present.  Cardiovascular: Normal rate, regular rhythm and normal heart sounds.  Exam reveals no gallop and no friction rub.   No murmur heard. Pulmonary/Chest: Effort normal and breath sounds normal. No respiratory distress. He has no wheezes. He has no rales. He exhibits no tenderness.  Abdominal: He exhibits no distension and no mass. There is no tenderness. There is no rebound and no guarding.  Genitourinary:       He has hemorrhoid superiorly, still with perianal fissure , there is also blood staining the skin here.   Musculoskeletal: He exhibits no edema and no tenderness.  Lymphadenopathy:    He has no cervical adenopathy.  Neurological: He is alert and oriented to person, place, and time. He has normal reflexes. He exhibits normal muscle tone. Coordination normal.  Skin: Skin is warm and dry. He is not diaphoretic. No erythema. No pallor.          Minimal folliculitis on forerams,  Umbilicated lesions on toes c/w molluscum  Psychiatric: He has a normal mood and affect. His  behavior is normal. Judgment and thought content normal.          Assessment & Plan:  HIV INFECTION Continue isentress and truvda, mepron for PCP prophylaxis  Hemorrhoid Try topical steroid  but concern with his fissure  Anal fissure Will refer back to GI. Already had colonsocopy. Need control constipation  Thrombocytopenia Appears due to chemo hopefully will recover soon  Molluscum contagiosum Should improve with ARV  Hemangioma No need for intervention but he is welcome to see Dermatology for through evalution

## 2011-02-02 NOTE — Assessment & Plan Note (Signed)
Should improve with ARV

## 2011-02-02 NOTE — Assessment & Plan Note (Signed)
Will refer back to GI. Already had colonsocopy. Need control constipation

## 2011-02-02 NOTE — Assessment & Plan Note (Signed)
Try topical steroid but concern with his fissure

## 2011-02-02 NOTE — Assessment & Plan Note (Signed)
Appears due to chemo hopefully will recover soon

## 2011-02-02 NOTE — Assessment & Plan Note (Signed)
No need for intervention but he is welcome to see Dermatology for through evalution

## 2011-02-02 NOTE — Assessment & Plan Note (Signed)
Continue isentress and Harvie Junior for PCP prophylaxis

## 2011-02-10 ENCOUNTER — Encounter (HOSPITAL_BASED_OUTPATIENT_CLINIC_OR_DEPARTMENT_OTHER): Payer: 59 | Admitting: Oncology

## 2011-02-10 DIAGNOSIS — B20 Human immunodeficiency virus [HIV] disease: Secondary | ICD-10-CM

## 2011-02-10 DIAGNOSIS — C8589 Other specified types of non-Hodgkin lymphoma, extranodal and solid organ sites: Secondary | ICD-10-CM

## 2011-02-10 DIAGNOSIS — Z452 Encounter for adjustment and management of vascular access device: Secondary | ICD-10-CM

## 2011-03-15 ENCOUNTER — Encounter (HOSPITAL_BASED_OUTPATIENT_CLINIC_OR_DEPARTMENT_OTHER): Payer: 59 | Admitting: Oncology

## 2011-03-15 ENCOUNTER — Other Ambulatory Visit: Payer: Self-pay | Admitting: Oncology

## 2011-03-15 ENCOUNTER — Other Ambulatory Visit (INDEPENDENT_AMBULATORY_CARE_PROVIDER_SITE_OTHER): Payer: 59

## 2011-03-15 ENCOUNTER — Ambulatory Visit (HOSPITAL_COMMUNITY)
Admission: RE | Admit: 2011-03-15 | Discharge: 2011-03-15 | Disposition: A | Discharge status: Home / Self Care | Payer: 59 | Source: Ambulatory Visit | Attending: Oncology | Admitting: Oncology

## 2011-03-15 ENCOUNTER — Other Ambulatory Visit: Payer: Self-pay | Admitting: Infectious Disease

## 2011-03-15 DIAGNOSIS — B20 Human immunodeficiency virus [HIV] disease: Secondary | ICD-10-CM

## 2011-03-15 DIAGNOSIS — C8588 Other specified types of non-Hodgkin lymphoma, lymph nodes of multiple sites: Secondary | ICD-10-CM

## 2011-03-15 DIAGNOSIS — C8589 Other specified types of non-Hodgkin lymphoma, extranodal and solid organ sites: Secondary | ICD-10-CM | POA: Insufficient documentation

## 2011-03-15 DIAGNOSIS — C8590 Non-Hodgkin lymphoma, unspecified, unspecified site: Secondary | ICD-10-CM

## 2011-03-15 DIAGNOSIS — B999 Unspecified infectious disease: Secondary | ICD-10-CM | POA: Insufficient documentation

## 2011-03-15 DIAGNOSIS — Z5111 Encounter for antineoplastic chemotherapy: Secondary | ICD-10-CM

## 2011-03-15 DIAGNOSIS — I700 Atherosclerosis of aorta: Secondary | ICD-10-CM | POA: Insufficient documentation

## 2011-03-15 LAB — MORPHOLOGY: RBC Comments: NORMAL

## 2011-03-15 LAB — CBC WITH DIFFERENTIAL/PLATELET
Basophils Relative: 1 % (ref 0–1)
Eosinophils Absolute: 0.1 10*3/uL (ref 0.0–0.5)
Eosinophils Absolute: 0.1 10*3/uL (ref 0.0–0.7)
Eosinophils Relative: 2 % (ref 0–5)
HCT: 45.1 % (ref 38.4–49.9)
LYMPH%: 23.6 % (ref 14.0–49.0)
Lymphs Abs: 1.1 10*3/uL (ref 0.7–4.0)
MCH: 32.4 pg (ref 26.0–34.0)
MCHC: 35 g/dL (ref 32.0–36.0)
MCHC: 34.4 g/dL (ref 30.0–36.0)
MCV: 95 fL (ref 79.3–98.0)
MCV: 94.1 fL (ref 78.0–100.0)
MONO%: 6.5 % (ref 0.0–14.0)
NEUT#: 2.6 10*3/uL (ref 1.5–6.5)
NEUT%: 66.6 % (ref 39.0–75.0)
Neutrophils Relative %: 65 % (ref 43–77)
Platelets: 102 10*3/uL — ABNORMAL LOW (ref 140–400)
Platelets: 109 10*3/uL — ABNORMAL LOW (ref 150–400)
RBC: 4.74 10*6/uL (ref 4.20–5.82)
RBC: 4.78 MIL/uL (ref 4.22–5.81)
RDW: 12.8 % (ref 11.5–15.5)

## 2011-03-15 LAB — CMP (CANCER CENTER ONLY)
Alkaline Phosphatase: 134 U/L — ABNORMAL HIGH (ref 26–84)
CO2: 27 mEq/L (ref 18–33)
Creat: 0.9 mg/dl (ref 0.6–1.2)
Glucose, Bld: 94 mg/dL (ref 73–118)
Sodium: 146 mEq/L — ABNORMAL HIGH (ref 128–145)
Total Bilirubin: 0.6 mg/dl (ref 0.20–1.60)
Total Protein: 7.7 g/dL (ref 6.4–8.1)

## 2011-03-15 LAB — COMPLETE METABOLIC PANEL WITH GFR
ALT: 26 U/L (ref 0–53)
Alkaline Phosphatase: 137 U/L — ABNORMAL HIGH (ref 39–117)
CO2: 27 mEq/L (ref 19–32)
Creat: 0.92 mg/dL (ref 0.50–1.35)
GFR, Est African American: >60 mL/min (ref >60)
GFR, Est Non African American: >60 mL/min (ref >60)
Total Bilirubin: 0.5 mg/dL (ref 0.3–1.2)

## 2011-03-15 MED ORDER — IOHEXOL 300 MG/ML  SOLN: 125.0000 mL | Freq: Once | INTRAMUSCULAR | Status: AC | PRN

## 2011-03-16 LAB — HIV-1 RNA QUANT-NO REFLEX-BLD: HIV-1 RNA Quant, Log: 1.81 {Log} — ABNORMAL HIGH (ref <1.30)

## 2011-03-16 LAB — T-HELPER CELL (CD4) - (RCID CLINIC ONLY)
CD4 % Helper T Cell: 15 % — ABNORMAL LOW (ref 33–55)
CD4 T Cell Abs: 160 uL — ABNORMAL LOW (ref 400–2700)

## 2011-03-20 ENCOUNTER — Inpatient Hospital Stay (HOSPITAL_COMMUNITY)
Admission: EM | Admit: 2011-03-20 | Discharge: 2011-03-23 | DRG: 314 | Disposition: A | Discharge status: Home / Self Care | Payer: 59 | Source: Other Acute Inpatient Hospital | Attending: Internal Medicine | Admitting: Internal Medicine

## 2011-03-20 ENCOUNTER — Emergency Department (HOSPITAL_COMMUNITY): Payer: 59

## 2011-03-20 ENCOUNTER — Encounter: Payer: Self-pay | Admitting: Internal Medicine

## 2011-03-20 ENCOUNTER — Emergency Department (HOSPITAL_COMMUNITY)
Admission: EM | Admit: 2011-03-20 | Discharge: 2011-03-20 | Disposition: A | Discharge status: Home / Self Care | Payer: 59 | Source: Home / Self Care | Attending: Emergency Medicine | Admitting: Emergency Medicine

## 2011-03-20 DIAGNOSIS — C8589 Other specified types of non-Hodgkin lymphoma, extranodal and solid organ sites: Secondary | ICD-10-CM | POA: Diagnosis present

## 2011-03-20 DIAGNOSIS — T80218A Other infection due to central venous catheter, initial encounter: Principal | ICD-10-CM | POA: Diagnosis present

## 2011-03-20 DIAGNOSIS — D709 Neutropenia, unspecified: Secondary | ICD-10-CM

## 2011-03-20 DIAGNOSIS — G609 Hereditary and idiopathic neuropathy, unspecified: Secondary | ICD-10-CM | POA: Diagnosis present

## 2011-03-20 DIAGNOSIS — Y849 Medical procedure, unspecified as the cause of abnormal reaction of the patient, or of later complication, without mention of misadventure at the time of the procedure: Secondary | ICD-10-CM | POA: Diagnosis present

## 2011-03-20 DIAGNOSIS — K602 Anal fissure, unspecified: Secondary | ICD-10-CM | POA: Diagnosis present

## 2011-03-20 DIAGNOSIS — B20 Human immunodeficiency virus [HIV] disease: Secondary | ICD-10-CM | POA: Diagnosis present

## 2011-03-20 DIAGNOSIS — L02219 Cutaneous abscess of trunk, unspecified: Secondary | ICD-10-CM | POA: Diagnosis present

## 2011-03-20 LAB — BASIC METABOLIC PANEL
CO2: 26 mEq/L (ref 19–32)
Chloride: 104 mEq/L (ref 96–112)
Creatinine, Ser: 0.82 mg/dL (ref 0.50–1.35)
GFR calc Af Amer: >90 mL/min (ref >90)
Sodium: 138 mEq/L (ref 135–145)

## 2011-03-20 LAB — DIFFERENTIAL
Basophils Absolute: 0 10*3/uL (ref 0.0–0.1)
Eosinophils Absolute: 0.1 10*3/uL (ref 0.0–0.7)
Lymphocytes Relative: 26 % (ref 12–46)
Monocytes Absolute: 0.4 10*3/uL (ref 0.1–1.0)
Neutrophils Relative %: 61 % (ref 43–77)

## 2011-03-20 LAB — CBC
MCV: 93 fL (ref 78.0–100.0)
Platelets: 93 10*3/uL — ABNORMAL LOW (ref 150–400)
RBC: 4.46 MIL/uL (ref 4.22–5.81)
RDW: 12.9 % (ref 11.5–15.5)
WBC: 4.6 10*3/uL (ref 4.0–10.5)

## 2011-03-20 NOTE — H&P (Signed)
Hospital Admission Note Date: 03/20/2011  Patient name: [name] Medical record number: [id number] Date of birth: 16-Oct-1965 Age: 45 y.o. Gender: male PCP: Acey Lav, MD, MD  Medical Service: Int Med Teaching Service  Attending physician:  Orvan Falconer  Pager: Resident (R2/R3):  Kalia-Reynolds Pager: [phone] Resident (R1):  Burdette   Pager: [phone]  Chief Complaint: Red Port-a-cath site  History of Present Illness: 45 year old man with HIV (C4 160 and viral load 65 on 03/15/2011, K103 mutation) and high-grade B-cell non-Hodgkin's lymphoma who presents with a red and slightly tender port site. Patient had his Port-A-Cath placed in February for treatment of his NHL. He has been getting CHOP chemotherapy managed by Dr.Grandfortuna (followup appointment on Tuesday to discuss CT results), with his last dose in June of 2012. Patient says that Friday he started noticing slight redness around his port site and, and by Saturday it was sore with a red circle surrounding it. He has no history of port infection, and was last accessed on Tuesday for a followup CT scan. Aside from the erythema and tenderness, the patient had an episode of diaphoresis on Friday night in the evening. The patient has never had anything like this before (has had night sweats).  He denies fevers or chills, nausea vomiting or diarrhea, abdominal pain, chest pain, cough, shortness of breath.   Past Medical History: 1. High-grade B-cell non-Hodgkin lymphoma  - Diagnosed February 2012 - CHOP chemotherapy - Managed by Dr. Marlena Clipper - Followup appointment this Tuesday, October 16 with oncologist - Had followup CT scan last Tuesday with negative results - Diarrhea secondary to chemotherapy: Workup including colonoscopy, flexible sigmoidoscopy, CMV, HSV, GC all negative  2. HIV, diagnosed Jan 2012 (CD-4 count Oct 160) - Diagnosed February 2012 - C4 and 60, viral load and 65 on 03/15/2011 - K103 mutation - Resistant  to evavirenz - Previously on atripla  3. History of a rectal bleed - colonoscopy by Dr. Carman Ching on July 23, 2010 - internal hemorrhoids, now thought to be possible seizures 5. Left elbow surgery with placement of a steel plate in 4034. 6. Deviated septum repair. 7. Bone marrow biopsy. 8. Bronchoscopy, July 02, 2010. 9. Colonoscopy July 23, 2010.  Meds: 1. Valtrex 500 mg daily 2. Truvada 200/300 mg 1 p.o. daily. 3. Isentress 400 mg 1 p.o. b.i.d. 4.Xanax 0.5 mg p.o. q.6 h. p.r.n. anxiety. 5.Mepron 750 mg per 10 mL with each meal as PCP prophylaxis - not taking consistently 6.Celexa 40 mg p.o. daily. 7. Multivitamin  Allergies: Sulfonamide derivatives  Family Hx: - father deceased of unknown cancer, COPD - mother alive with HTN  Social Hx: History   Social History  . Marital Status: Single    Spouse Name: N/A    Number of Children: N/A  . Years of Education: N/A   Occupational History  . Not on file.   Social History Main Topics  . Smoking status: Never Smoker   . Smokeless tobacco: Not on file  . Alcohol Use: No  . Drug Use: No  . Sexually Active: Not on file   Other Topics Concern  . Not on file   Social History Narrative  . No narrative on file    Review of Systems: As per HPI  Physical Exam: VItal signs: 97.9    124/78       64    21     97%/ra GEN: No apparent distress.  Alert and oriented x 3.  Pleasant, conversant, and cooperative to exam. HEENT:  head is autraumatic and normocephalic.  Neck is supple without palpable masses or lymphadenopathy.  EOMI.  PERRLA.  Sclerae anicteric.  Conjunctivae without pallor or injection. Mucous membranes are moist.   RESP:  Lungs are clear to ascultation bilaterally with good air movement.  No wheezes, ronchi, or rubs. CARDIOVASCULAR: regular rate, normal rhythm.  Clear S1, S2, no murmurs, gallops, or rubs. ABDOMEN: soft, non-tender, non-distended.  Bowels sounds present in all quadrants and  normoactive.  No palpable masses. EXT: warm and dry.  Peripheral pulses equal, intact, and +2 globally.  No clubbing or cyanosis.  No edema in b/l lower extremeties SKIN: warm and dry with normal turgor.  No rashes or abnormal lesions observed. NEURO: communicating freely, moving all four limbs without difficulty  Lab results: Basic Metabolic Panel:  Basename 03/20/11 1215  NA 138  K 4.1  CL 104  CO2 26  GLUCOSE 75  BUN 14  CREATININE 0.82  CALCIUM 9.6  MG --  PHOS --   CBC:  Basename 03/20/11 1215  WBC 4.6  NEUTROABS 2.9  HGB 14.3  HCT 41.5  MCV 93.0  PLT 93*    Imaging results:  Dg Chest 2 View  03/20/2011  *RADIOLOGY REPORT*  Clinical Data: Infection  CHEST - 2 VIEW  Comparison: 08/12/2010; 08/09/2010; chest CT - 03/15/2011  Findings: Unchanged cardiac silhouette and mediastinal contours. Stable support apparatus.  Minimal linear heterogeneous opacities within the right mid/upper lung.  No new focal parenchymal opacities.  No pleural effusion or pneumothorax.  Unchanged bones.  IMPRESSION:     1.    No acute cardiopulmonary disease.        2.    Minimal linear heterogeneous opacities within the right mid/upper lung likely atelectasis or scar.  Original Report Authenticated By: Waynard Reeds, M.D.    Assessment & Plan by Problem: #Erythematous port site Patient presents with a two-day history of erythema and mild tenderness of his right-sided Port-A-Cath. Patient has no other signs or symptoms concerning for infection, but he is HIV patient who was previously been suppressed secondary to chemotherapy. Consequently, we will start him on antibiotics and get blood cultures. The patient has an appointment with his primary oncologist on Tuesday. Given his recent negative CT scan, it is possible that the plan would be to pull his line at that time (last chemotherapy June 2012). The primary team should contact his oncologist and ask if pulling a few days early would be  appropriate, if indeed that would've been the plan. In the meantime we will continue antibiosis and follow the erythema, which we marked with a surgical marker. The patient received 1 dose of vancomycin at [facility]. - Doxycycline 100mg  by mouth twice a day - Contact oncologist regarding necessity of keeping the line in - Followup blood cultures - Check erythema margins  #HIV Last CD4 count of 160, on Truvada and Isentress, not taking his prescribed atovaquone (tired of taking). We'll continue current therapy, and primary team to make any changes as they see fit. - Continue Truvada and Isentress - Continue atovaquone  #Thrombocytopenia Patient presents with a count of less than 100, which seems to have started this year. Possible etiologies include chemotherapy-induced thrombocytopenia versus HIV related. - We'll allow primary team to decide if a smear is warranted during this hospitalization  #Prophylaxis - Lovenox   R2/3______________________________      R1________________________________  ATTENDING: I performed and/or observed a history and physical examination of the patient.  I discussed  the case with the residents as noted and reviewed the residents' notes.  I agree with the findings and plan--please refer to the attending physician note for more details.  Signature________________________________  Printed Name_____________________________

## 2011-03-21 ENCOUNTER — Inpatient Hospital Stay (HOSPITAL_COMMUNITY): Payer: 59

## 2011-03-21 DIAGNOSIS — B20 Human immunodeficiency virus [HIV] disease: Secondary | ICD-10-CM

## 2011-03-21 DIAGNOSIS — D709 Neutropenia, unspecified: Secondary | ICD-10-CM

## 2011-03-21 DIAGNOSIS — C8589 Other specified types of non-Hodgkin lymphoma, extranodal and solid organ sites: Secondary | ICD-10-CM

## 2011-03-21 DIAGNOSIS — T80218A Other infection due to central venous catheter, initial encounter: Secondary | ICD-10-CM

## 2011-03-21 LAB — DIFFERENTIAL
Eosinophils Absolute: 0.2 10*3/uL (ref 0.0–0.7)
Eosinophils Relative: 6 % — ABNORMAL HIGH (ref 0–5)
Lymphocytes Relative: 25 % (ref 12–46)
Monocytes Absolute: 0.4 10*3/uL (ref 0.1–1.0)
Neutrophils Relative %: 58 % (ref 43–77)
Smear Review: DECREASED

## 2011-03-21 LAB — BASIC METABOLIC PANEL
CO2: 26 mEq/L (ref 19–32)
Chloride: 108 mEq/L (ref 96–112)
GFR calc Af Amer: >90 mL/min (ref >90)
Potassium: 4.4 mEq/L (ref 3.5–5.1)

## 2011-03-21 LAB — CBC
HCT: 40.8 % (ref 39.0–52.0)
Platelets: 89 10*3/uL — ABNORMAL LOW (ref 150–400)
RBC: 4.32 MIL/uL (ref 4.22–5.81)
RDW: 13 % (ref 11.5–15.5)
WBC: 3.6 10*3/uL — ABNORMAL LOW (ref 4.0–10.5)

## 2011-03-21 LAB — PROTIME-INR
INR: 0.96 (ref 0.00–1.49)
Prothrombin Time: 13 seconds (ref 11.6–15.2)

## 2011-03-22 LAB — CBC
Hemoglobin: 14 g/dL (ref 13.0–17.0)
RBC: 4.28 MIL/uL (ref 4.22–5.81)
WBC: 4 10*3/uL (ref 4.0–10.5)

## 2011-03-23 DIAGNOSIS — D709 Neutropenia, unspecified: Secondary | ICD-10-CM

## 2011-03-24 LAB — CATH TIP CULTURE: Culture: NO GROWTH

## 2011-03-26 LAB — CULTURE, BLOOD (ROUTINE X 2)
Culture  Setup Time: 201210141852
Culture: NO GROWTH

## 2011-03-29 ENCOUNTER — Emergency Department (HOSPITAL_COMMUNITY): Payer: 59

## 2011-03-29 ENCOUNTER — Telehealth: Payer: Self-pay | Admitting: *Deleted

## 2011-03-29 ENCOUNTER — Emergency Department (HOSPITAL_COMMUNITY)
Admission: EM | Admit: 2011-03-29 | Discharge: 2011-03-29 | Disposition: A | Discharge status: Home / Self Care | Payer: 59 | Attending: Emergency Medicine | Admitting: Emergency Medicine

## 2011-03-29 DIAGNOSIS — M79609 Pain in unspecified limb: Secondary | ICD-10-CM | POA: Insufficient documentation

## 2011-03-29 DIAGNOSIS — B20 Human immunodeficiency virus [HIV] disease: Secondary | ICD-10-CM | POA: Insufficient documentation

## 2011-03-29 DIAGNOSIS — Z87898 Personal history of other specified conditions: Secondary | ICD-10-CM | POA: Insufficient documentation

## 2011-03-29 NOTE — Telephone Encounter (Signed)
States he had an port removed in radiology 1 wk ago. The swelling has gone down where the IV was in his hand, but he can see & feel something under his skin. He thinks there is a piece of it still in him. Advised him to go to ED where he can be imaged to see if there is something. He agreed & will keep appt here tomorrow

## 2011-03-30 ENCOUNTER — Ambulatory Visit (HOSPITAL_COMMUNITY)
Admit: 2011-03-30 | Discharge: 2011-03-30 | Disposition: A | Discharge status: Home / Self Care | Payer: 59 | Attending: Orthopedic Surgery | Admitting: Orthopedic Surgery

## 2011-03-30 ENCOUNTER — Encounter: Payer: Self-pay | Admitting: Infectious Disease

## 2011-03-30 ENCOUNTER — Ambulatory Visit (INDEPENDENT_AMBULATORY_CARE_PROVIDER_SITE_OTHER): Payer: 59 | Admitting: Infectious Disease

## 2011-03-30 VITALS — BP 124/85 | HR 67 | Temp 98.3°F | Wt 186.0 lb

## 2011-03-30 DIAGNOSIS — B081 Molluscum contagiosum: Secondary | ICD-10-CM

## 2011-03-30 DIAGNOSIS — B20 Human immunodeficiency virus [HIV] disease: Secondary | ICD-10-CM

## 2011-03-30 DIAGNOSIS — L739 Follicular disorder, unspecified: Secondary | ICD-10-CM

## 2011-03-30 DIAGNOSIS — L738 Other specified follicular disorders: Secondary | ICD-10-CM

## 2011-03-30 DIAGNOSIS — R609 Edema, unspecified: Secondary | ICD-10-CM | POA: Insufficient documentation

## 2011-03-30 DIAGNOSIS — M79609 Pain in unspecified limb: Secondary | ICD-10-CM | POA: Insufficient documentation

## 2011-03-30 DIAGNOSIS — C8589 Other specified types of non-Hodgkin lymphoma, extranodal and solid organ sites: Secondary | ICD-10-CM

## 2011-03-30 MED ORDER — TRIAMCINOLONE ACETONIDE 0.5 % EX OINT: TOPICAL_OINTMENT | Freq: Two times a day (BID) | CUTANEOUS | Status: AC

## 2011-03-30 NOTE — Progress Notes (Signed)
Subjective:    Patient ID: [name], male    DOB: 12/24/1965, 45 y.o.   MRN: [id number]  HPI  45 year old Caucasian male with HIV relatively well controlled.with most recent viral load in 60s (likely blip), aggressive B cell lymphoma.  He was recently diagnosed with portacath that became infected and was removed by IR. The picc tip was cultured as was his blood cultures were negative. He was rx with vancomycin in house and then dc without antibiotics. He has had no more fevers, or chills, nausea or malaise. He continues to suffer from a folliculitis on arms and legs. He also continues to suffer from some lesions on his right foot that looked a bit umbilicated. Otherwise he is doing well and his recent imaging and blood work with Dr Cyndie Chime were very encouraging.  Review of Systems  Constitutional: Negative for fever, chills, diaphoresis, activity change, appetite change, fatigue and unexpected weight change.  HENT: Negative for congestion, sore throat, rhinorrhea, sneezing, trouble swallowing and sinus pressure.   Eyes: Negative for photophobia and visual disturbance.  Respiratory: Negative for cough, chest tightness, shortness of breath, wheezing and stridor.   Cardiovascular: Negative for chest pain, palpitations and leg swelling.  Gastrointestinal: Negative for nausea, vomiting, abdominal pain, diarrhea, constipation, blood in stool, abdominal distention and anal bleeding.  Genitourinary: Negative for dysuria, hematuria, flank pain and difficulty urinating.  Musculoskeletal: Negative for myalgias, back pain, joint swelling, arthralgias and gait problem.  Skin: Positive for rash and wound. Negative for color change and pallor.  Neurological: Negative for dizziness, tremors, weakness and light-headedness.  Hematological: Negative for adenopathy. Does not bruise/bleed easily.  Psychiatric/Behavioral: Negative for behavioral problems, confusion, sleep disturbance, dysphoric mood,  decreased concentration and agitation.       Objective:   Physical Exam  Constitutional: He is oriented to person, place, and time. He appears well-developed and well-nourished. No distress.  HENT:  Head: Normocephalic and atraumatic.  Mouth/Throat: Oropharynx is clear and moist. No oropharyngeal exudate.  Eyes: Conjunctivae and EOM are normal. Pupils are equal, round, and reactive to light. No scleral icterus.  Neck: Normal range of motion. Neck supple. No JVD present.  Cardiovascular: Normal rate, regular rhythm and normal heart sounds.  Exam reveals no gallop and no friction rub.   No murmur heard. Pulmonary/Chest: Effort normal and breath sounds normal. No respiratory distress. He has no wheezes. He has no rales. He exhibits no tenderness.  Abdominal: He exhibits no distension and no mass. There is no tenderness. There is no rebound and no guarding.  Musculoskeletal: He exhibits no edema and no tenderness.  Lymphadenopathy:    He has no cervical adenopathy.  Neurological: He is alert and oriented to person, place, and time. He has normal reflexes. He exhibits normal muscle tone. Coordination normal.  Skin: Skin is warm and dry. He is not diaphoretic. No erythema. No pallor.     Psychiatric: He has a normal mood and affect. His behavior is normal. Judgment and thought content normal.          Assessment & Plan:  HIV INFECTION Happy with his numers, will check another cd4 today to see if above 200. Could consider other single tablet regimens such as stribild, or complera ( provided he only had K103) for now continue his isentress and truvada  NON-HODGKIN'S LYMPHOMA Followed closely by Dr Cyndie Chime. He has fu CT scan and appt with him in 4 months  Folliculitis Try steroid cream, refer to Dermatology  Molluscum contagiosum Foot  lesions looked ab it like this intially. Will refer to derm for this as well

## 2011-03-30 NOTE — Assessment & Plan Note (Signed)
Followed closely by Dr Cyndie Chime. He has fu CT scan and appt with him in 4 months

## 2011-03-30 NOTE — Assessment & Plan Note (Signed)
Foot lesions looked ab it like this intially. Will refer to derm for this as well

## 2011-03-30 NOTE — Assessment & Plan Note (Signed)
Happy with his numers, will check another cd4 today to see if above 200. Could consider other single tablet regimens such as stribild, or complera ( provided he only had K103) for now continue his isentress and truvada

## 2011-03-30 NOTE — Assessment & Plan Note (Signed)
Try steroid cream, refer to Dermatology

## 2011-03-31 LAB — T-HELPER CELL (CD4) - (RCID CLINIC ONLY): CD4 T Cell Abs: 140 uL — ABNORMAL LOW (ref 400–2700)

## 2011-04-04 ENCOUNTER — Telehealth: Payer: Self-pay | Admitting: *Deleted

## 2011-04-04 ENCOUNTER — Other Ambulatory Visit: Payer: Self-pay | Admitting: *Deleted

## 2011-04-04 DIAGNOSIS — B20 Human immunodeficiency virus [HIV] disease: Secondary | ICD-10-CM

## 2011-04-04 MED ORDER — ATOVAQUONE 750 MG/5ML PO SUSP: 1500.0000 mg | Freq: Every day | ORAL | Status: DC

## 2011-04-04 NOTE — Telephone Encounter (Signed)
Pt asked for a hard copy to be mailed to him. States he will save considerable money if he gets 3 months at a time thru his mail order pharmacy.

## 2011-04-04 NOTE — Telephone Encounter (Signed)
He called & lm asking that we call back with lab result from date of last office visit. He had said that it was ok to leave a message. I lm that the number was [phone] & to call for questions

## 2011-04-05 NOTE — Discharge Summary (Signed)
[name], [name]            ACCOUNT NO.:  [id number]  MEDICAL RECORD NO.:  [id number]  LOCATION:  5150                         FACILITY:  [facility]  PHYSICIAN:  Cliffton Asters, M.D.    DATE OF BIRTH:  05-11-1966  DATE OF ADMISSION:  03/20/2011 DATE OF DISCHARGE:  03/23/2011                              DISCHARGE SUMMARY   DISCHARGE DIAGNOSES: 1. Port catheter infection. 2. High-grade B-cell non-Hodgkin lymphoma. 3. Human immunodeficiency virus. 4. Peripheral neuropathy. 5. Anal fissure. 6. Thrombocytopenia.  DISCHARGE MEDICATIONS: 1. Atovaquone 750 mg/5 mL, 1500 mg by mouth daily. 2. Celexa 40 mg, 1 tablet by mouth daily 3. Raltegravir 400 mg one tablet by mouth twice daily. 4. Multivitamins, one tablet by mouth daily. 5. Truvada 200/300 mg one tablet by mouth daily. 6. Valtrex 500 mg one tablet by mouth daily at bedtime. 7. Xanax 0.5 mg one tablet by mouth every 6 hours as needed for     anxiety.  DISPOSITION AND FOLLOWUP:  The patient was discharged in stable and improved condition from [facility] on March 23, 2011.  The patient will follow up with Dr. Daiva Eves in the Infectious Disease Clinic, March 30, 2011, at 9:15 a.m.  PROCEDURES PERFORMED: 1. Removal of port catheter by IR on March 21, 2011. 2. Chest x-ray, no acute cardiopulmonary disease, minimal linear     heterogeneous opacities within the right mid upper lung, likely     atelectasis or scar.  CONSULTATIONS:  Hematology, Dr. Cyndie Chime.  ADMITTING HISTORY AND PHYSICAL:  The patient is a 45 year old man with HIV (CD4 = 160 and viral load = 65 on March 15, 2011, K103 mutation) and high-grade B-cell non-Hodgkin lymphoma who presents with a red and slightly tender port site.  The patient had his Port-A-Cath placed in February for treatment of his NHL.  He has been getting CHOP chemotherapy managed by Dr. Cyndie Chime with his last dose in June 2012. The patient says that Friday, he started  noticing redness around his port site, and by Saturday, it was sore with surrounding redness. He has no history of port infection, and his port was last accessed 5 days prior to admission for followup CT scan.  Aside from the erythema and tenderness, the patient had an episode of diaphoresis on Friday night in the evening.  The patient has never had anything like this before.  He denies fevers, chills, nausea, vomiting, diarrhea, abdominal pain, chest pain, cough, or shortness of breath.  PHYSICAL EXAMINATION:  VITAL SIGNS:  Temperature 97.9, blood pressure 124/78, heart rate 64, respirations 21, O2 saturation 97% on room air. GENERAL:  No apparent distress, alert and oriented x3, pleasant, conversant, and cooperative with exam. HEENT:  Pupils equal, round, and reactive to light.  Extraocular movements intact. NECK:  Supple without palpable masses or lymphadenopathy.  Mucous membranes are moist. PULMONARY:  Lungs clear to auscultation bilaterally.  No wheezes, rales, or rhonchi. CARDIOVASCULAR:  Regular rate and rhythm.  No murmurs, gallops, or rubs. ABDOMEN:  Soft, nontender, nondistended.  Bowel sounds present.  No palpable masses. EXTREMITIES:  Peripheral pulses equal, intact, and 2+ globally.  No clubbing or cyanosis.  No edema in bilateral lower extremities. NEUROLOGIC:  Alert  and oriented x3.  Cranial nerves II through XII intact.  Strength is grossly intact throughout.  ADMITTING LABS:  Sodium 138, potassium 4.1, chloride 104, bicarb 26, BUN 14, creatinine 0.82, glucose 75.  WBC 4.6, hemoglobin 14.3, hematocrit 41.5, platelets 93.  HOSPITAL COURSE BY PROBLEM: 1. Port-A-Cath cellulitis.  The patient presented with 3 x 3 cm area     of tenderness, swelling, and redness around his port site.     Although the patient has no fevers or chills, the patient had     diaphoresis prior to admission.  Most likely diagnosis was Port-A-     Cath infection.  The patient was empirically  treated with IV     vancomycin.  Blood cultures were drawn prior to starting     antibiotics and the Port-A-Cath was removed and the tip was sent     for culture on the second day of hospitalization.  The patient     remained afebrile and with no leukocytosis during hospitalization.     The blood and catheter tip cultures showed no growth at 3 days,     the patient's IV vancomycin was stopped, and the patient was     discharged on no antibiotics.  At discharge, the patient was     afebrile and with no leukocytosis.  We will follow up with Dr. Daiva Eves in the Infectious Disease Clinic. 2. HIV.  The patient's CD4 was 160 and viral load was 65 on March 15, 2011.  We continued his antiretroviral therapy and discharged the     patient on his home antiretroviral therapy medication. 3. High-grade B-cell non-Hodgkin lymphoma.  Diagnosis was made in     February 2012.  The patient completed 6 cycles of CHOP chemotherapy     and 4 cycles of intrathecal methotrexate therapy.  CT scan on     March 15, 2011, showed no evidence of lymphomatous involvement in     the neck, chest, abdomen, and pelvis.  He will be followed by Dr.     Cyndie Chime in 4 months. 4. Peripheral neuropathy.  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     This issue was not acutely managed during hospitalization and will     likely gradually and partially recover.  This issue will be     monitored at future visits. 5. Thrombocytopenia.  The patient's thrombocytopenia is likely     multifactorial including bone marrow suppression of chemotherapy,     non-Hodgkin lymphoma, HIV infection, and side effects of HIV     medication.  The patient had no active bleeding in the hospital.     The patient's thrombocytopenia is expected to improve gradually     with time after cessation of chemotherapy.  DISCHARGE VITALS:  Temperature 97.3, blood pressure 108/61, pulse  54, respirations 16, oxygenation 99% on room air.  DISCHARGE LABS:  WBC 4.0, hemoglobin 14, hematocrit 39.7, platelets 86.    ______________________________ Janalyn Harder, MD   ______________________________ Cliffton Asters, M.D.    RB/MEDQ  D:  03/30/2011  T:  03/31/2011  Job:  [id number]  cc:   Acey Lav, MD Levert Feinstein, M.D., F.A.C.P.  Electronically Signed by Janalyn Harder MD on 04/04/2011 03:37:37 PM Electronically Signed by Cliffton Asters M.D. on 04/05/2011 02:37:39 PM

## 2011-04-08 ENCOUNTER — Other Ambulatory Visit: Payer: Self-pay | Admitting: Oncology

## 2011-04-08 DIAGNOSIS — C8590 Non-Hodgkin lymphoma, unspecified, unspecified site: Secondary | ICD-10-CM

## 2011-04-26 ENCOUNTER — Telehealth: Payer: Self-pay | Admitting: *Deleted

## 2011-04-26 NOTE — Telephone Encounter (Signed)
Received call from Case Manager/[employer]/[name] stating she sent a request via fax [phone] on 04/11/11  & wanted to know if we received it.   Returned call to [phone] & left vm that she resend request to [phone].

## 2011-05-06 ENCOUNTER — Emergency Department (HOSPITAL_COMMUNITY)
Admission: EM | Admit: 2011-05-06 | Discharge: 2011-05-06 | Disposition: A | Discharge status: Home / Self Care | Payer: 59 | Source: Home / Self Care | Attending: Family Medicine | Admitting: Family Medicine

## 2011-05-06 ENCOUNTER — Encounter (HOSPITAL_COMMUNITY): Payer: Self-pay

## 2011-05-06 ENCOUNTER — Emergency Department (INDEPENDENT_AMBULATORY_CARE_PROVIDER_SITE_OTHER): Payer: 59

## 2011-05-06 DIAGNOSIS — J40 Bronchitis, not specified as acute or chronic: Secondary | ICD-10-CM

## 2011-05-06 MED ORDER — AZITHROMYCIN 250 MG PO TABS: 250.0000 mg | ORAL_TABLET | Freq: Every day | ORAL | Status: AC

## 2011-05-06 MED ORDER — GUAIFENESIN-CODEINE 100-10 MG/5ML PO SYRP: 5.0000 mL | ORAL_SOLUTION | Freq: Four times a day (QID) | ORAL | Status: AC | PRN

## 2011-05-06 NOTE — ED Notes (Signed)
States sick for 2 weeks, no improvement w his home treatment; yellow- green secretions; NAD at present; ? Decreased breath sounds LUL?/RML?/LLL?

## 2011-05-06 NOTE — ED Provider Notes (Signed)
History     CSN: [id number] Arrival date & time: 05/06/2011 11:50 AM   First MD Initiated Contact with Patient 05/06/11 1050      Chief Complaint  Patient presents with  . Fever    (Consider location/radiation/quality/duration/timing/severity/associated sxs/prior treatment) Patient is a 45 y.o. male presenting with fever. The history is provided by the patient.  Fever Primary symptoms of the febrile illness include fever. Primary symptoms do not include cough. The current episode started more than 1 week ago. This is a new problem.  Risk factors for febrile illness include history of cancer and HIV. STATES HIS SORE THROAT HAS RESOLVED. CHEST HURTS FROM COUGHING. FEVER LOW GRADE. COUGH IS MOSTLY DRY.   Past Medical History  Diagnosis Date  . Cancer   . Non Hodgkin's lymphoma   . HIV positive     History reviewed. No pertinent past surgical history.  History reviewed. No pertinent family history.  History  Substance Use Topics  . Smoking status: Never Smoker   . Smokeless tobacco: Not on file  . Alcohol Use: Yes     occasional      Review of Systems  Constitutional: Positive for fever.  HENT: Positive for congestion.   Respiratory: Positive for chest tightness. Negative for cough.   Gastrointestinal: Negative.   Genitourinary: Negative.     Allergies  Sulfonamide derivatives  Home Medications   Current Outpatient Rx  Name Route Sig Dispense Refill  . ALPRAZOLAM 0.5 MG PO TABS Oral Take 1 tablet (0.5 mg total) by mouth 2 (two) times daily as needed. 60 tablet 4  . ATOVAQUONE 750 MG/5ML PO SUSP Oral Take 10 mLs (1,500 mg total) by mouth daily. dispense 3 month supply 630 mL 4    Please dispense 3 months supply at a time  . CEPHALEXIN 500 MG PO CAPS Oral Take 500 mg by mouth 2 (two) times daily. For 10 days     . CITALOPRAM HYDROBROMIDE 40 MG PO TABS      . EMTRICITABINE-TENOFOVIR 200-300 MG PO TABS Oral Take 1 tablet by mouth daily. 90 tablet 4  .  ONDANSETRON HCL 4 MG PO TABS Oral Take 4 mg by mouth every 8 (eight) hours as needed.      Marland Kitchen RALTEGRAVIR POTASSIUM 400 MG PO TABS Oral Take 1 tablet (400 mg total) by mouth 2 (two) times daily. 180 tablet 4  . TRIAMCINOLONE ACETONIDE 0.5 % EX OINT Topical Apply topically 2 (two) times daily. 30 g 2  . VALACYCLOVIR HCL 500 MG PO TABS Oral Take 1 tablet (500 mg total) by mouth daily. 90 tablet 4    BP 124/88  Pulse 72  Temp(Src) 98.5 F (36.9 C) (Oral)  Resp 18  SpO2 99%  Physical Exam  Nursing note and vitals reviewed. Constitutional: He appears well-developed and well-nourished. No distress.  HENT:  Head: Normocephalic.  Right Ear: External ear normal.  Left Ear: External ear normal.  Mouth/Throat: Oropharynx is clear and moist.       NASAL CONGESTION  Neck: Normal range of motion. Neck supple.  Cardiovascular: Normal rate and regular rhythm.   Pulmonary/Chest: Effort normal.       RHONCHI NOTED RIGHT SIDE  Lymphadenopathy:    He has no cervical adenopathy.  Skin: Skin is warm and dry.    ED Course  Procedures (including critical care time)  Labs Reviewed - No data to display Dg Chest 2 View  05/06/2011  *RADIOLOGY REPORT*  Clinical Data: Cough and fever  CHEST - 2 VIEW  Comparison: 03/20/2011  Findings: Right-sided Port-A-Cath has been removed.  Right upper lobe peripheral linear scarring is stable.  Heart size is normal. Lungs otherwise clear.  No pleural effusion.  No pneumothorax.  No acute osseous finding.  IMPRESSION: No focal acute finding.  Original Report Authenticated By: Harrel Lemon, M.D.     No diagnosis found.    MDM          Randa Spike, MD 05/06/11 [phone]

## 2011-05-09 ENCOUNTER — Encounter: Payer: Self-pay | Admitting: Licensed Clinical Social Worker

## 2011-05-18 ENCOUNTER — Other Ambulatory Visit: Payer: Self-pay | Admitting: Dermatology

## 2011-05-26 ENCOUNTER — Telehealth: Payer: Self-pay | Admitting: *Deleted

## 2011-05-26 NOTE — Telephone Encounter (Signed)
Pt following up from dermatology visit w/ Dr. Jorja Loa.  Dr. Jorja Loa recommending referral to general surgeon.  Pt requesting that Dr. Cyndie Chime be involved in the decision-making process.  Dermatology OV notes received today and placed in Dr. Zenaida Niece Dam's box for review.  Margit Hanks, CMA reviewed OV notes and stated that she would discuss with Dr. Daiva Eves 05/27/11.

## 2011-05-27 ENCOUNTER — Other Ambulatory Visit: Payer: Self-pay | Admitting: Infectious Disease

## 2011-05-27 ENCOUNTER — Telehealth: Payer: Self-pay | Admitting: *Deleted

## 2011-05-27 DIAGNOSIS — D099 Carcinoma in situ, unspecified: Secondary | ICD-10-CM | POA: Insufficient documentation

## 2011-05-27 NOTE — Telephone Encounter (Signed)
He left a message asking if referral to surgeon was done. I called him back & told him we just got the referral & it will be done either today or next Thursday or Friday. I explained that we had to copy all records & send them. That office will then notify us when they can see him. I have the records copied & will send the referral monday

## 2011-05-28 NOTE — Telephone Encounter (Signed)
I ordered [facility] referral i didn't know pt was aware of dx we are going to set this up after xmas. Fine if [facility] involved as well

## 2011-06-02 NOTE — Telephone Encounter (Signed)
He wanted to kno wif the appt was made yet. Told him I sent all on this past Monday 05/30/11. We are waiting for them to call us with the report

## 2011-06-06 ENCOUNTER — Telehealth: Payer: Self-pay

## 2011-06-06 ENCOUNTER — Other Ambulatory Visit: Payer: 59

## 2011-06-06 ENCOUNTER — Other Ambulatory Visit: Payer: Self-pay | Admitting: *Deleted

## 2011-06-06 ENCOUNTER — Other Ambulatory Visit: Payer: Self-pay | Admitting: Infectious Disease

## 2011-06-06 ENCOUNTER — Other Ambulatory Visit: Payer: Self-pay | Admitting: Licensed Clinical Social Worker

## 2011-06-06 DIAGNOSIS — Z113 Encounter for screening for infections with a predominantly sexual mode of transmission: Secondary | ICD-10-CM

## 2011-06-06 DIAGNOSIS — C8589 Other specified types of non-Hodgkin lymphoma, extranodal and solid organ sites: Secondary | ICD-10-CM

## 2011-06-06 DIAGNOSIS — B081 Molluscum contagiosum: Secondary | ICD-10-CM

## 2011-06-06 DIAGNOSIS — L739 Follicular disorder, unspecified: Secondary | ICD-10-CM

## 2011-06-06 DIAGNOSIS — IMO0002 Reserved for concepts with insufficient information to code with codable children: Secondary | ICD-10-CM

## 2011-06-06 DIAGNOSIS — B20 Human immunodeficiency virus [HIV] disease: Secondary | ICD-10-CM

## 2011-06-06 LAB — CBC WITH DIFFERENTIAL/PLATELET
Eosinophils Relative: 3 % (ref 0–5)
Hemoglobin: 15.4 g/dL (ref 13.0–17.0)
Lymphocytes Relative: 18 % (ref 12–46)
Lymphs Abs: 0.9 10*3/uL (ref 0.7–4.0)
MCH: 33.3 pg (ref 26.0–34.0)
MCV: 97 fL (ref 78.0–100.0)
Monocytes Relative: 7 % (ref 3–12)
Neutrophils Relative %: 72 % (ref 43–77)
Platelets: 147 10*3/uL — ABNORMAL LOW (ref 150–400)
RBC: 4.63 MIL/uL (ref 4.22–5.81)
WBC: 5.1 10*3/uL (ref 4.0–10.5)

## 2011-06-06 LAB — URINALYSIS, ROUTINE W REFLEX MICROSCOPIC
Nitrite: NEGATIVE
Specific Gravity, Urine: 1.024 (ref 1.005–1.030)
Urobilinogen, UA: 0.2 mg/dL (ref 0.0–1.0)
pH: 5.5 (ref 5.0–8.0)

## 2011-06-06 LAB — COMPLETE METABOLIC PANEL WITH GFR
AST: 25 U/L (ref 0–37)
Albumin: 4.4 g/dL (ref 3.5–5.2)
Alkaline Phosphatase: 151 U/L — ABNORMAL HIGH (ref 39–117)
GFR, Est Non African American: >89 mL/min
Glucose, Bld: 90 mg/dL (ref 70–99)
Potassium: 4.3 mEq/L (ref 3.5–5.3)
Sodium: 139 mEq/L (ref 135–145)
Total Bilirubin: 0.8 mg/dL (ref 0.3–1.2)
Total Protein: 7.4 g/dL (ref 6.0–8.3)

## 2011-06-06 LAB — LIPID PANEL
HDL: 35 mg/dL — ABNORMAL LOW (ref >39)
LDL Cholesterol: 125 mg/dL — ABNORMAL HIGH (ref 0–99)
Total CHOL/HDL Ratio: 5.6 Ratio
VLDL: 37 mg/dL (ref 0–40)

## 2011-06-06 LAB — RPR

## 2011-06-06 LAB — T-HELPER CELL (CD4) - (RCID CLINIC ONLY)
CD4 % Helper T Cell: 14 % — ABNORMAL LOW (ref 33–55)
CD4 T Cell Abs: 120 uL — ABNORMAL LOW (ref 400–2700)

## 2011-06-06 LAB — URINALYSIS, MICROSCOPIC ONLY: Squamous Epithelial / LPF: NONE SEEN

## 2011-06-06 NOTE — Telephone Encounter (Signed)
Pt notified per Dr Cyndie Chime that he agrees with the plan of Dr Daiva Eves.   Pt to see colo-rectal surgeon on Thursday, 1/3.  Verbalizes appreciation for call. dph

## 2011-06-06 NOTE — Telephone Encounter (Signed)
rec'd a call on voice mail from Eugene at [facility]. That md wants him referred to a colo-rectal specialist. Their number is [phone] if we want further info

## 2011-06-06 NOTE — Telephone Encounter (Signed)
Spoke with the patient and will be making a referral to [facility] to Dr. Toni Arthurs in [place]. Awaiting an appointment.

## 2011-06-06 NOTE — Telephone Encounter (Signed)
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  Pt is being referred to a colo-rectal surgeon for eval of rectal involvement.   Pt wanted to be sure Dr Cyndie Chime was notified and Dr Daiva Eves is open to any suggestions by Dr Cyndie Chime.    Pt also presents to [facility] lobby requesting to be seen today re: same as above.  Pt notified that he will not be worked in today, but Charity fundraiser will call him after note reviewed by MD.  dph

## 2011-06-07 DIAGNOSIS — D099 Carcinoma in situ, unspecified: Secondary | ICD-10-CM

## 2011-06-07 HISTORY — DX: Carcinoma in situ, unspecified: D09.9

## 2011-06-07 HISTORY — PX: ANAL INTRAEPITHELIAL NEOPLASIA EXCISION: SHX5241

## 2011-06-20 ENCOUNTER — Encounter: Payer: Self-pay | Admitting: Infectious Disease

## 2011-06-20 ENCOUNTER — Ambulatory Visit (INDEPENDENT_AMBULATORY_CARE_PROVIDER_SITE_OTHER): Payer: 59 | Admitting: Infectious Disease

## 2011-06-20 VITALS — BP 123/82 | HR 71 | Temp 98.0°F | Wt 191.0 lb

## 2011-06-20 DIAGNOSIS — F329 Major depressive disorder, single episode, unspecified: Secondary | ICD-10-CM | POA: Insufficient documentation

## 2011-06-20 DIAGNOSIS — B20 Human immunodeficiency virus [HIV] disease: Secondary | ICD-10-CM

## 2011-06-20 DIAGNOSIS — C8589 Other specified types of non-Hodgkin lymphoma, extranodal and solid organ sites: Secondary | ICD-10-CM

## 2011-06-20 DIAGNOSIS — F3289 Other specified depressive episodes: Secondary | ICD-10-CM

## 2011-06-20 DIAGNOSIS — F411 Generalized anxiety disorder: Secondary | ICD-10-CM

## 2011-06-20 DIAGNOSIS — F419 Anxiety disorder, unspecified: Secondary | ICD-10-CM

## 2011-06-20 DIAGNOSIS — D099 Carcinoma in situ, unspecified: Secondary | ICD-10-CM

## 2011-06-20 MED ORDER — ALPRAZOLAM 0.5 MG PO TABS: 0.5000 mg | ORAL_TABLET | Freq: Two times a day (BID) | ORAL | Status: DC | PRN

## 2011-06-20 MED ORDER — CITALOPRAM HYDROBROMIDE 40 MG PO TABS: 40.0000 mg | ORAL_TABLET | Freq: Every day | ORAL | Status: DC

## 2011-06-20 NOTE — Assessment & Plan Note (Signed)
Continue isentress and truvada 

## 2011-06-20 NOTE — Assessment & Plan Note (Signed)
Sp chemo by dr. Cyndie Chime. He has fu next month

## 2011-06-20 NOTE — Progress Notes (Signed)
  Subjective:    Patient ID: [name], male    DOB: 11/02/1965, 46 y.o.   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 Hortense Ramal in [facility]. He is a bit anxious about the upcomign surgery and a bit depressed though he denies si or hi.   Review of Systems  Constitutional: Negative for fever, chills, diaphoresis, activity change, appetite change, fatigue and unexpected weight change.  HENT: Negative for congestion, sore throat, rhinorrhea, sneezing, trouble swallowing and sinus pressure.   Eyes: Negative for photophobia and visual disturbance.  Respiratory: Negative for cough, chest tightness, shortness of breath, wheezing and stridor.   Cardiovascular: Negative for chest pain, palpitations and leg swelling.  Gastrointestinal: Negative for nausea, vomiting, abdominal pain, diarrhea, constipation, blood in stool, abdominal distention and anal bleeding.  Genitourinary: Negative for dysuria, hematuria, flank pain and difficulty urinating.  Musculoskeletal: Negative for myalgias, back pain, joint swelling, arthralgias and gait problem.  Skin: Negative for color change, pallor, rash and wound.  Neurological: Negative for dizziness, tremors, weakness and light-headedness.  Hematological: Negative for adenopathy. Does not bruise/bleed easily.  Psychiatric/Behavioral: Negative for behavioral problems, confusion, sleep disturbance, dysphoric mood, decreased concentration and agitation.       Objective:   Physical Exam  Constitutional: He is oriented to person, place, and time. He appears well-developed and well-nourished. No distress.  HENT:  Head: Normocephalic and atraumatic.  Mouth/Throat: Oropharynx is clear and moist. No oropharyngeal exudate.  Eyes: Conjunctivae and EOM are normal. Pupils are  equal, round, and reactive to light. No scleral icterus.  Neck: Normal range of motion. Neck supple. No JVD present.  Cardiovascular: Normal rate, regular rhythm and normal heart sounds.  Exam reveals no gallop and no friction rub.   No murmur heard. Pulmonary/Chest: Effort normal and breath sounds normal. No respiratory distress. He has no wheezes. He has no rales. He exhibits no tenderness.  Abdominal: He exhibits no distension and no mass. There is no tenderness. There is no rebound and no guarding.  Musculoskeletal: He exhibits no edema and no tenderness.  Lymphadenopathy:    He has no cervical adenopathy.  Neurological: He is alert and oriented to person, place, and time. He has normal reflexes. He exhibits normal muscle tone. Coordination normal.  Skin: Skin is warm and dry. He is not diaphoretic. No erythema. No pallor.  Psychiatric: His behavior is normal. Judgment and thought content normal. He exhibits a depressed mood.          Assessment & Plan:

## 2011-06-20 NOTE — Assessment & Plan Note (Signed)
Continue ssri and anxiolytic

## 2011-06-20 NOTE — Assessment & Plan Note (Signed)
For surgery with Dr. Jorja Loa next week

## 2011-06-22 ENCOUNTER — Ambulatory Visit (INDEPENDENT_AMBULATORY_CARE_PROVIDER_SITE_OTHER): Payer: 59 | Admitting: Surgery

## 2011-07-04 ENCOUNTER — Other Ambulatory Visit: Payer: Self-pay | Admitting: Licensed Clinical Social Worker

## 2011-07-04 DIAGNOSIS — B20 Human immunodeficiency virus [HIV] disease: Secondary | ICD-10-CM

## 2011-07-04 MED ORDER — RALTEGRAVIR POTASSIUM 400 MG PO TABS: 400.0000 mg | ORAL_TABLET | Freq: Two times a day (BID) | ORAL | Status: DC

## 2011-07-04 MED ORDER — EMTRICITABINE-TENOFOVIR DF 200-300 MG PO TABS: 1.0000 | ORAL_TABLET | Freq: Every day | ORAL | Status: DC

## 2011-07-12 ENCOUNTER — Telehealth: Payer: Self-pay | Admitting: *Deleted

## 2011-07-12 ENCOUNTER — Ambulatory Visit (HOSPITAL_COMMUNITY)
Admission: RE | Admit: 2011-07-12 | Discharge: 2011-07-12 | Disposition: A | Discharge status: Home / Self Care | Payer: 59 | Source: Ambulatory Visit | Attending: Oncology | Admitting: Oncology

## 2011-07-12 ENCOUNTER — Other Ambulatory Visit: Payer: Self-pay | Admitting: *Deleted

## 2011-07-12 ENCOUNTER — Other Ambulatory Visit (HOSPITAL_BASED_OUTPATIENT_CLINIC_OR_DEPARTMENT_OTHER): Payer: 59 | Admitting: Lab

## 2011-07-12 DIAGNOSIS — B20 Human immunodeficiency virus [HIV] disease: Secondary | ICD-10-CM

## 2011-07-12 DIAGNOSIS — C8589 Other specified types of non-Hodgkin lymphoma, extranodal and solid organ sites: Secondary | ICD-10-CM | POA: Insufficient documentation

## 2011-07-12 DIAGNOSIS — J984 Other disorders of lung: Secondary | ICD-10-CM | POA: Insufficient documentation

## 2011-07-12 DIAGNOSIS — Z21 Asymptomatic human immunodeficiency virus [HIV] infection status: Secondary | ICD-10-CM | POA: Insufficient documentation

## 2011-07-12 DIAGNOSIS — Z9221 Personal history of antineoplastic chemotherapy: Secondary | ICD-10-CM | POA: Insufficient documentation

## 2011-07-12 DIAGNOSIS — Z5111 Encounter for antineoplastic chemotherapy: Secondary | ICD-10-CM

## 2011-07-12 DIAGNOSIS — R748 Abnormal levels of other serum enzymes: Secondary | ICD-10-CM

## 2011-07-12 DIAGNOSIS — C8590 Non-Hodgkin lymphoma, unspecified, unspecified site: Secondary | ICD-10-CM

## 2011-07-12 DIAGNOSIS — Z452 Encounter for adjustment and management of vascular access device: Secondary | ICD-10-CM

## 2011-07-12 LAB — CBC WITH DIFFERENTIAL/PLATELET
Basophils Absolute: 0 10*3/uL (ref 0.0–0.1)
Eosinophils Absolute: 0.1 10*3/uL (ref 0.0–0.5)
HCT: 45 % (ref 38.4–49.9)
HGB: 15.5 g/dL (ref 13.0–17.1)
LYMPH%: 20 % (ref 14.0–49.0)
MONO#: 0.4 10*3/uL (ref 0.1–0.9)
NEUT#: 4.7 10*3/uL (ref 1.5–6.5)
NEUT%: 71.8 % (ref 39.0–75.0)
Platelets: 152 10*3/uL (ref 140–400)
WBC: 6.5 10*3/uL (ref 4.0–10.3)

## 2011-07-12 LAB — CMP (CANCER CENTER ONLY)
BUN, Bld: 14 mg/dL (ref 7–22)
CO2: 28 mEq/L (ref 18–33)
Glucose, Bld: 93 mg/dL (ref 73–118)
Sodium: 142 mEq/L (ref 128–145)
Total Bilirubin: 0.9 mg/dl (ref 0.20–1.60)
Total Protein: 8.7 g/dL — ABNORMAL HIGH (ref 6.4–8.1)

## 2011-07-12 MED ORDER — RALTEGRAVIR POTASSIUM 400 MG PO TABS: 400.0000 mg | ORAL_TABLET | Freq: Two times a day (BID) | ORAL | Status: DC

## 2011-07-12 MED ORDER — IOHEXOL 300 MG/ML  SOLN
100.0000 mL | Freq: Once | INTRAMUSCULAR | Status: AC | PRN
  Administered 2011-07-12: 100 mL via INTRAVENOUS

## 2011-07-12 NOTE — Telephone Encounter (Signed)
Called pt to ask if he had started any new meds since liver enzymes elevated.  He reports that he had surg. 06/24/11 & was on vicodin & ketoralac but off for a wk now.  Copy of labs routed to Dr. Eston Mould & labs will be repeated with next visit per Lonna Cobb NP.

## 2011-07-13 ENCOUNTER — Other Ambulatory Visit: Payer: Self-pay | Admitting: *Deleted

## 2011-07-13 DIAGNOSIS — C8589 Other specified types of non-Hodgkin lymphoma, extranodal and solid organ sites: Secondary | ICD-10-CM

## 2011-07-13 LAB — LACTATE DEHYDROGENASE: LDH: 161 U/L (ref 94–250)

## 2011-07-13 LAB — SEDIMENTATION RATE: Sed Rate: 22 mm/hr — ABNORMAL HIGH (ref 0–16)

## 2011-07-18 ENCOUNTER — Other Ambulatory Visit: Payer: Self-pay

## 2011-07-19 ENCOUNTER — Other Ambulatory Visit: Payer: Self-pay | Admitting: Licensed Clinical Social Worker

## 2011-07-19 ENCOUNTER — Other Ambulatory Visit: Payer: 59

## 2011-07-19 ENCOUNTER — Ambulatory Visit (HOSPITAL_BASED_OUTPATIENT_CLINIC_OR_DEPARTMENT_OTHER): Payer: 59 | Admitting: Nurse Practitioner

## 2011-07-19 ENCOUNTER — Telehealth: Payer: Self-pay | Admitting: Oncology

## 2011-07-19 VITALS — BP 128/85 | HR 64 | Temp 97.2°F | Ht 69.0 in | Wt 190.5 lb

## 2011-07-19 DIAGNOSIS — C8588 Other specified types of non-Hodgkin lymphoma, lymph nodes of multiple sites: Secondary | ICD-10-CM

## 2011-07-19 DIAGNOSIS — B20 Human immunodeficiency virus [HIV] disease: Secondary | ICD-10-CM

## 2011-07-19 DIAGNOSIS — C8589 Other specified types of non-Hodgkin lymphoma, extranodal and solid organ sites: Secondary | ICD-10-CM

## 2011-07-19 DIAGNOSIS — R748 Abnormal levels of other serum enzymes: Secondary | ICD-10-CM

## 2011-07-19 MED ORDER — EMTRICITABINE-TENOFOVIR DF 200-300 MG PO TABS: 1.0000 | ORAL_TABLET | Freq: Every day | ORAL | Status: DC

## 2011-07-19 NOTE — Progress Notes (Signed)
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 He initially presented with a cough and right pulmonary infiltrate. Pulmonary evaluation and bronchoscopy with biopsies on 07/02/2010 with nondiagnostic findings. Pathology showed marked inflammation and atypical cells. A lymphoproliferative process was suspected. PET scan showed intense uptake in the right upper lung, right nasal and oropharynx and additional areas in the left upper lung, right mediastinum, right hilar and precarinal areas. There was a single hypermetabolic focus in the right lobe of the liver. CT-guided biopsy of the liver lesion 07/20/2010 showed a high-grade B cell non-Hodgkin's lymphoma. The tissue was negative for CD20 and CD79a on the initial report but positive for CD45 and CD45 RB. Bone marrow biopsy was negative. Serum LDH and uric acid were normal. 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 Due to due is HIV positive status and apparent oropharyngeal involvement with lymphoma a lumbar puncture was done on 07/29/2010 and was negative for lymphoma. He completed 6 cycles of CHOP chemotherapy 07/30/2010 through 11/12/2010 followed by a course of prophylactic intrathecal methotrexate.  Restaging CT scans of the chest and abdomen on 07/12/2011 showed no evidence of lymphomatous involvement in the chest or abdomen. Small retroperitoneal nodes measuring up to 7 mm were again noted.  [name] is seen today for scheduled followup. 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 He underwent surgery on 06/24/2011. He reports the wound has been slow to heal and a cauterization procedure was performed yesterday.  He denies fevers. He has intermittent sweats. He has a good appetite. He is gaining weight. He denies shortness of breath. No cough. No palpable  lymph nodes. No bowel or bladder problems.   Objective: Blood pressure 128/85, pulse 64, temperature 97.2 F (36.2 C), temperature source Oral, height 5\' 9"  (1.753 m), weight 190 lb 8 oz (86.41 kg).  Oropharynx is without thrush or ulceration. No palpable cervical, supraclavicular, axillary or inguinal lymph nodes. Lungs are clear. Regular cardiac rhythm. Abdomen is soft and nontender. No organomegaly. Extremities are without edema. Calves soft and nontender. Motor strength 5 over 5. At the posterior anal margin/lower gluteal fold there is a wound with gray colored discharge. The wound base is red.  Lab Results: Lab Results  Component Value Date   WBC 6.5 07/12/2011   HGB 15.5 07/12/2011   HCT 45.0 07/12/2011   MCV 97.7 07/12/2011   PLT 152 07/12/2011    Chemistry:    Chemistry      Component Value Date/Time   NA 142 07/12/2011 1151   NA 139 06/06/2011 0943   K 4.1 07/12/2011 1151   K 4.3 06/06/2011 0943   CL 101 07/12/2011 1151   CL 105 06/06/2011 0943   CO2 28 07/12/2011 1151   CO2 25 06/06/2011 0943   BUN 14 07/12/2011 1151   BUN 16 06/06/2011 0943   CREATININE 1.0 07/12/2011 1151   CREATININE 0.89 03/21/2011 0615      Component Value Date/Time   CALCIUM 9.5 07/12/2011 1151   CALCIUM 9.5 06/06/2011 0943   ALKPHOS 152* 07/12/2011 1151   ALKPHOS 151* 06/06/2011 0943   AST 39* 07/12/2011 1151   AST 25 06/06/2011 0943   ALT 41 06/06/2011 0943   BILITOT 0.90 07/12/2011 1151   BILITOT 0.8 06/06/2011 0943       Studies/Results: Ct Chest W Contrast  07/12/2011  *RADIOLOGY  REPORT*  Clinical Data:  Follow up non-Hodgkins lymphoma diagnosed 07/2010, chemotherapy complete, HIV positive  CT CHEST AND ABDOMEN WITH CONTRAST  Technique:  Multidetector CT imaging of the chest and abdomen was performed following the standard protocol during bolus administration of intravenous contrast.  Contrast: OMNIPAQUE IOHEXOL 300 MG/ML IV SOLN  Comparison:  03/15/2011  CT CHEST  Findings:  Stable scarring in the  lateral right upper lobe.  Mild tree-in-bud nodularity in the lateral right lower lobe (series 5/image 33), new, likely infectious/inflammatory.  No suspicious pulmonary nodules.  Mild centrilobular emphysematous changes.  No pleural effusion or pneumothorax.  Visualized thyroid is unremarkable.  The heart is normal in size.  No pericardial effusion.  No suspicious mediastinal, hilar, or axillary lymphadenopathy.  Visualized osseous structures are within normal limits.  IMPRESSION: No evidence of lymphomatous involvement in the chest.  Mild tree-in-bud nodularity in the lateral right lower lobe, new, likely infectious/inflammatory.  Underlying mild centrilobular emphysematous changes.  CT ABDOMEN  Findings:  Liver, spleen, pancreas, and adrenal glands are within normal limits.  Gallbladder is unremarkable.  No intrahepatic or extrahepatic ductal dilatation.  Kidneys are within normal limits.  No hydronephrosis.  Mild atherosclerotic calcifications of the abdominal aorta and branch vessels.  No abdominal ascites.  No suspicious abdominal lymphadenopathy. Again noted are small retroperitoneal nodes measuring up to 7 mm short axis.  Visualized bowel is unremarkable.  Visualized osseous structures are within normal limits.  The pelvis was not imaged.  IMPRESSION: No evidence of lymphomatous involvement in the abdomen.  The pelvis was not imaged.  Original Report Authenticated By: Charline Bills, M.D.   Ct Abdomen W Contrast  07/12/2011  *RADIOLOGY REPORT*  Clinical Data:  Follow up non-Hodgkins lymphoma diagnosed 07/2010, chemotherapy complete, HIV positive  CT CHEST AND ABDOMEN WITH CONTRAST  Technique:  Multidetector CT imaging of the chest and abdomen was performed following the standard protocol during bolus administration of intravenous contrast.  Contrast: OMNIPAQUE IOHEXOL 300 MG/ML IV SOLN  Comparison:  03/15/2011  CT CHEST  Findings:  Stable scarring in the lateral right upper lobe.  Mild tree-in-bud  nodularity in the lateral right lower lobe (series 5/image 33), new, likely infectious/inflammatory.  No suspicious pulmonary nodules.  Mild centrilobular emphysematous changes.  No pleural effusion or pneumothorax.  Visualized thyroid is unremarkable.  The heart is normal in size.  No pericardial effusion.  No suspicious mediastinal, hilar, or axillary lymphadenopathy.  Visualized osseous structures are within normal limits.  IMPRESSION: No evidence of lymphomatous involvement in the chest.  Mild tree-in-bud nodularity in the lateral right lower lobe, new, likely infectious/inflammatory.  Underlying mild centrilobular emphysematous changes.  CT ABDOMEN  Findings:  Liver, spleen, pancreas, and adrenal glands are within normal limits.  Gallbladder is unremarkable.  No intrahepatic or extrahepatic ductal dilatation.  Kidneys are within normal limits.  No hydronephrosis.  Mild atherosclerotic calcifications of the abdominal aorta and branch vessels.  No abdominal ascites.  No suspicious abdominal lymphadenopathy. Again noted are small retroperitoneal nodes measuring up to 7 mm short axis.  Visualized bowel is unremarkable.  Visualized osseous structures are within normal limits.  The pelvis was not imaged.  IMPRESSION: No evidence of lymphomatous involvement in the abdomen.  The pelvis was not imaged.  Original Report Authenticated By: Charline Bills, M.D.    Medications: I have reviewed the patient's current medications.  Assessment/Plan:  1. Stage IVB high-grade large cell CD20 negative non-Hodgkin's lymphoma diagnosed February 2012 status post 6 cycles of CHOP chemotherapy  07/30/2010 through 11/12/2010 followed by prophylactic intrathecal methotrexate. Recent restaging CT scans negative for evidence of recurrent lymphoma. 2. 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 He continues close followup with the surgeon while the wound heals. 3. Mild  elevation of transaminases on labs 07/12/2011. We are repeating today. 4. HIV/AIDS followed by Dr. Daiva Eves. 5. Status post Port-A-Cath removal.  Disposition-[name] appears stable. He remains in remission from the non-Hodgkin's lymphoma. We will repeat scans at a 4 month interval. He will return for a followup visit approximately one week following the scans. He will contact the office the interim with any problems.  Patient seen with Dr.Granfortuna.    Lonna Cobb ANP/GNP-BC

## 2011-07-19 NOTE — Telephone Encounter (Signed)
appts made and printed for 6/3 and 6/7   aom

## 2011-07-20 LAB — COMPREHENSIVE METABOLIC PANEL
ALT: 32 U/L (ref 0–53)
AST: 26 U/L (ref 0–37)
Calcium: 9.9 mg/dL (ref 8.4–10.5)
Chloride: 104 mEq/L (ref 96–112)
Creatinine, Ser: 1.09 mg/dL (ref 0.50–1.35)
Total Bilirubin: 0.6 mg/dL (ref 0.3–1.2)

## 2011-08-27 IMAGING — CT CT CHEST W/ CM
2 of 5 series · 16 of 46 positions shown, 18 images · IV contrast (agent unspecified)
Comparison: PET CT 07/19/2010 and CT chest 06/21/2010

CT CHEST

CLINICAL DATA: Lymphoma and diarrhea.  Cough and diffuse abdominal
pain.  Colitis.  HIV.

CT CHEST, ABDOMEN AND PELVIS WITH CONTRAST
TECHNIQUE: Multidetector CT imaging of the chest, abdomen and
pelvis was performed following the standard protocol during bolus
administration of intravenous contrast.
Contrast: 100 ml [id number].

[Series 2: cap with st · axial · 0.73mm/px · z∈[-622,-72]mm · 13 of 126 slices shown, 15 images]
[im 8/126  soft-tissue]
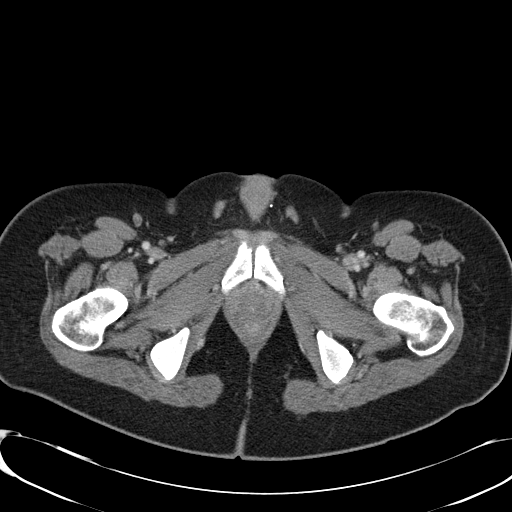
[im 8/126  bone]
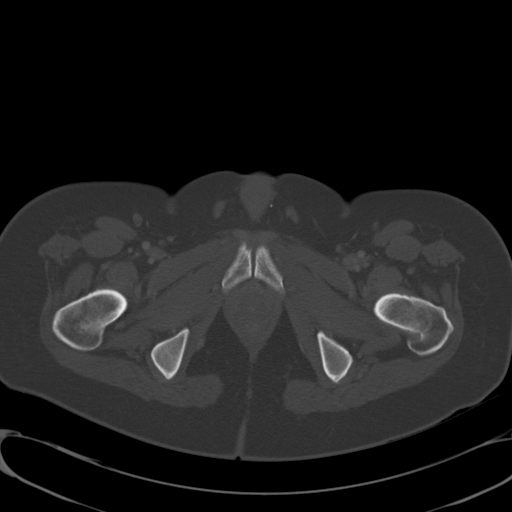
[im 15/126  soft-tissue]
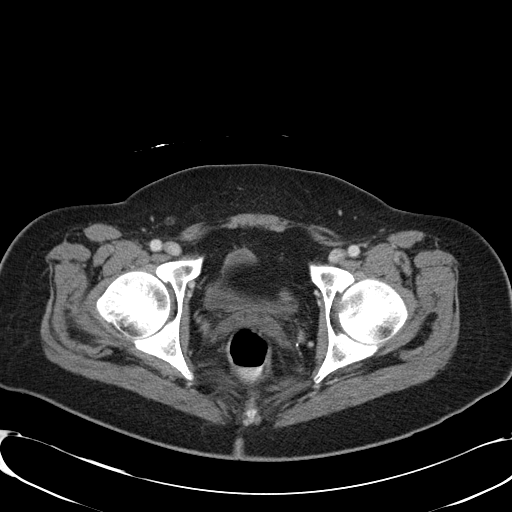
[im 30/126  soft-tissue]
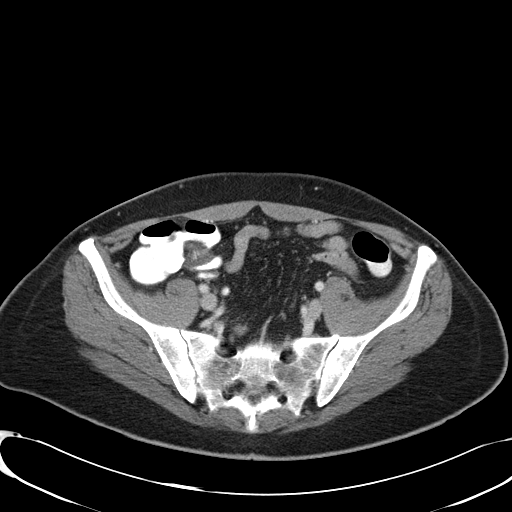
[im 37/126  soft-tissue]
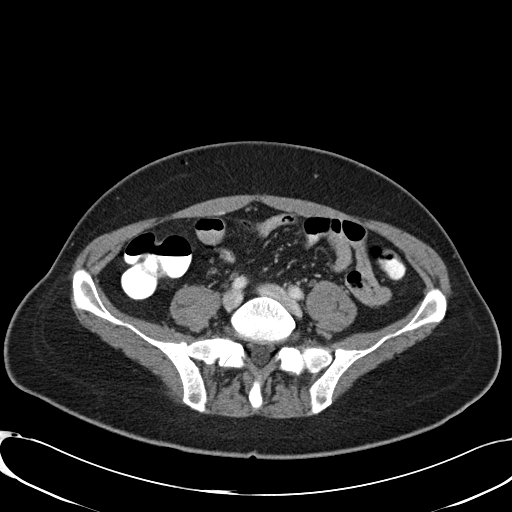
[im 45/126  soft-tissue]
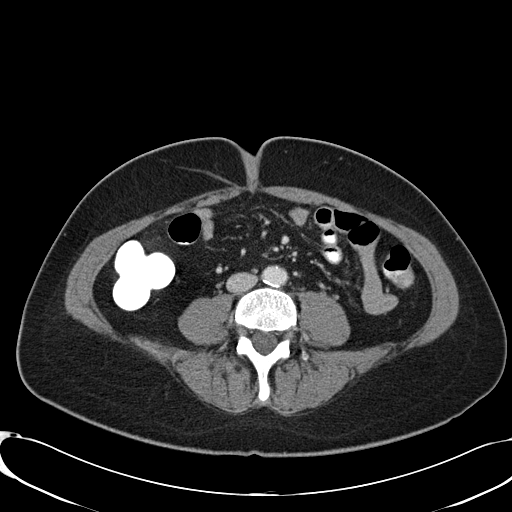
[im 52/126  soft-tissue]
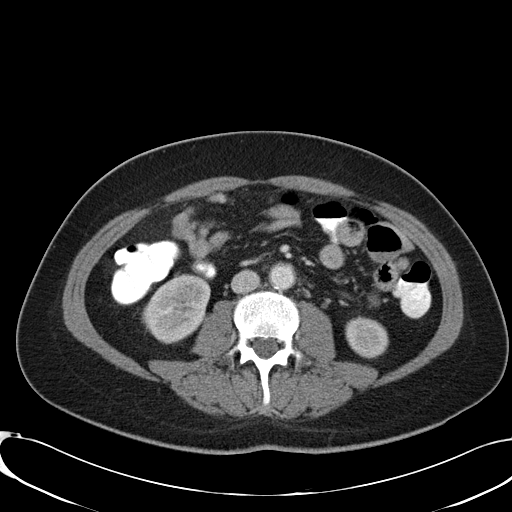
[im 67/126  soft-tissue]
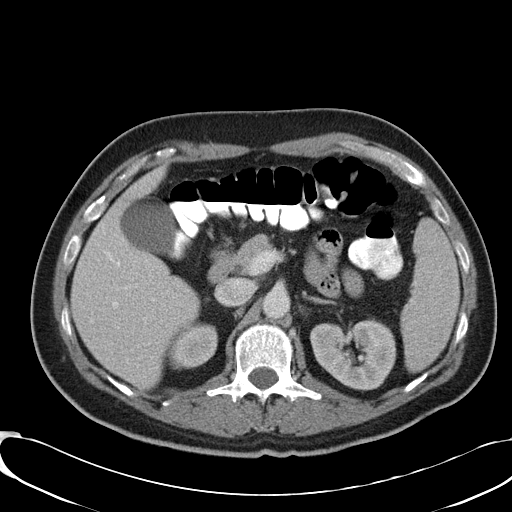
[im 74/126  soft-tissue]
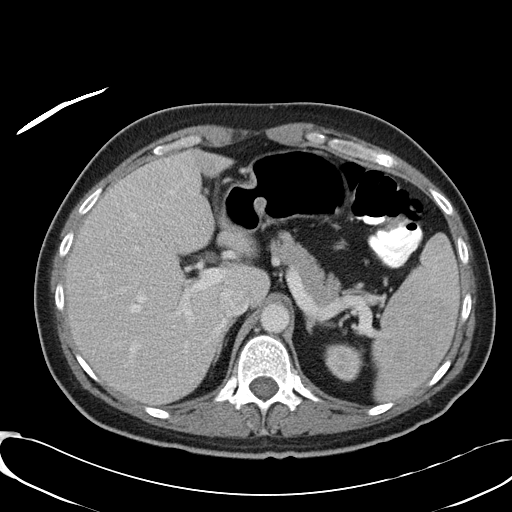
[im 81/126  soft-tissue]
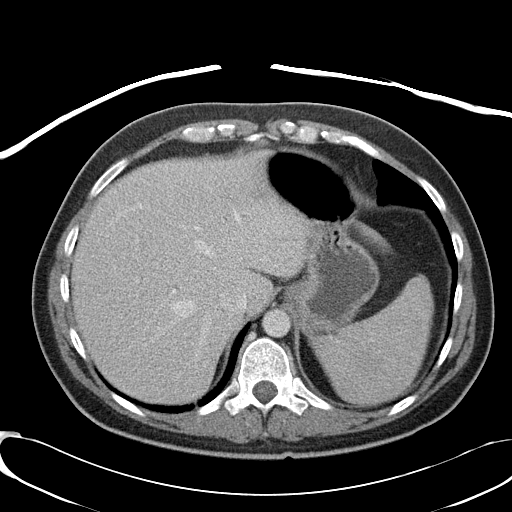
[im 81/126  bone]
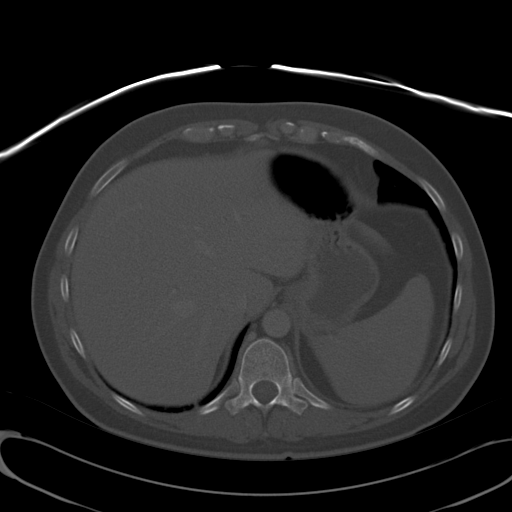
[im 89/126  soft-tissue]
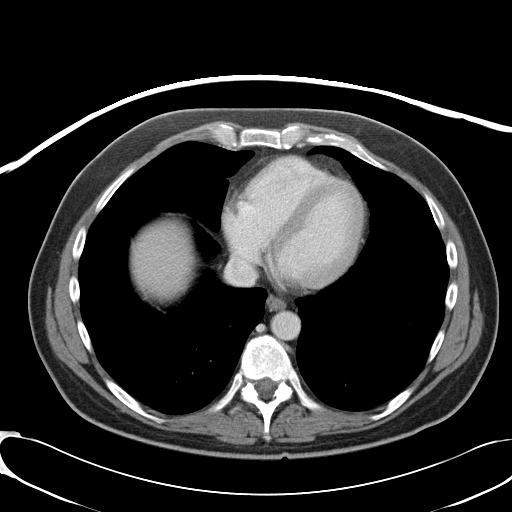
[im 96/126  soft-tissue]
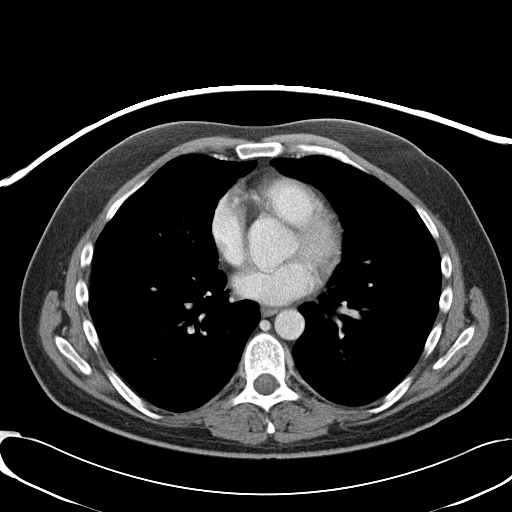
[im 111/126  soft-tissue]
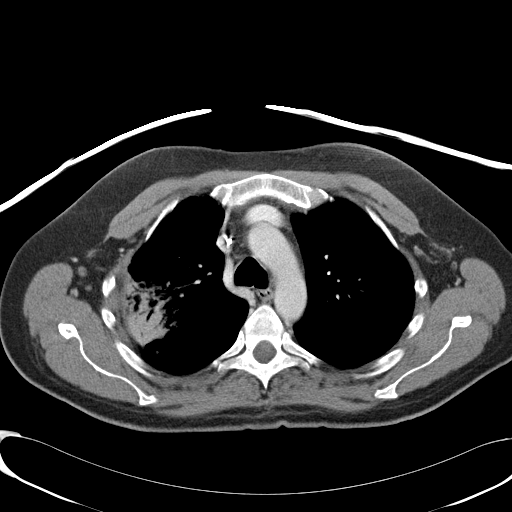
[im 118/126  soft-tissue]
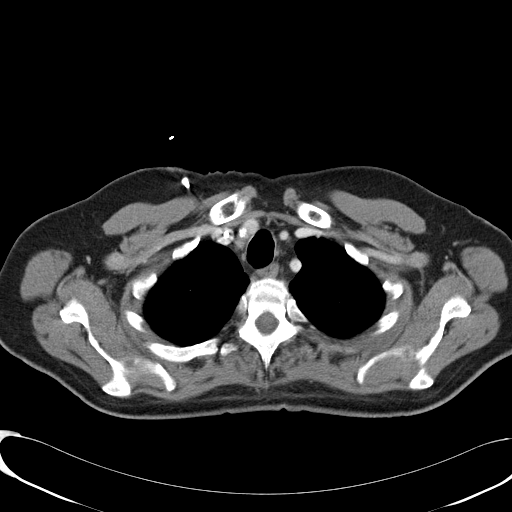

[Series 602: <mpr thick range> · coronal · 1.23mm/px · 3 of 89 slices shown]
[im 30/89  soft-tissue]
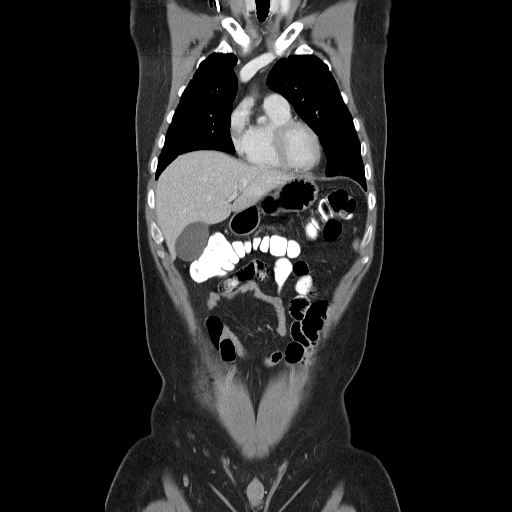
[im 40/89  soft-tissue]
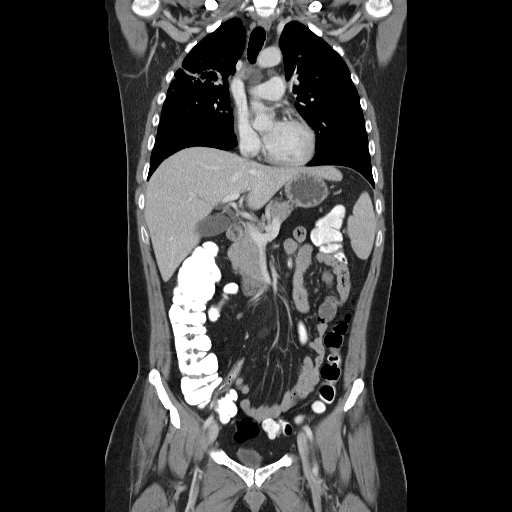
[im 49/89  soft-tissue]
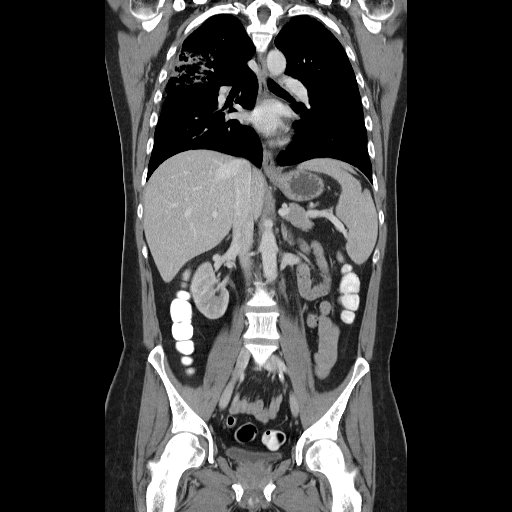

[16 of 46 positions shown; findings below may reference images not displayed]

FINDINGS: Mediastinal lymph nodes are not enlarged by CT size
criteria.  Difficult to exclude right hilar lymphoid tissue.  No
left hilar or axillary adenopathy.  Right IJ Port-A-Cath terminates
at the superior cavoatrial junction.  Heart size normal.  No
pericardial effusion.

Mild biapical pleural parenchymal scarring. Right upper lobe air
space consolidation has improved from 07/19/2010, without complete
resolution.

There is peribronchovascular nodularity in both lower lobes with
minimal involvement of the lingula.  These findings are new from
06/21/2010.

Left apical nodule is less prominent than on the prior study, now
measuring approximately 4 mm (image 13).  No pleural fluid.  Airway
is unremarkable.
IMPRESSION: 1. Interval improvement in right upper lobe air space consolidation
when compared with 07/19/2010, without complete resolution.
2.  New lower lobe predominant peribronchovascular nodularity,
which may be due to atypical or fungal infection.
3.  Small left apical nodule is slightly less prominent.

CT ABDOMEN AND PELVIS
FINDINGS: Sub centimeter low attenuation lesion in the peripheral
right hepatic lobe is again noted.  Liver, gallbladder, adrenal
glands and kidneys are otherwise unremarkable.  Spleen measures
14.1 cm.  Pancreas, stomach and bowel are unremarkable.
Atherosclerotic calcification of the arterial vasculature.  No
pathologically enlarged lymph nodes.  No free fluid.  Locules of
subcutaneous air are seen in the ventral abdominal wall, and are
presumably iatrogenic in etiology.  No worrisome lytic or sclerotic
lesions.
IMPRESSION: 1.  Sub centimeter low attenuation lesion in the right hepatic
lobe.
2.  Mild splenic enlargement.

## 2011-09-07 ENCOUNTER — Other Ambulatory Visit: Payer: 59

## 2011-09-07 DIAGNOSIS — B20 Human immunodeficiency virus [HIV] disease: Secondary | ICD-10-CM

## 2011-09-07 LAB — COMPLETE METABOLIC PANEL WITH GFR
Albumin: 4.6 g/dL (ref 3.5–5.2)
CO2: 27 mEq/L (ref 19–32)
GFR, Est African American: >89 mL/min
GFR, Est Non African American: 84 mL/min
Glucose, Bld: 96 mg/dL (ref 70–99)
Potassium: 4.5 mEq/L (ref 3.5–5.3)
Sodium: 141 mEq/L (ref 135–145)
Total Protein: 7.7 g/dL (ref 6.0–8.3)

## 2011-09-07 LAB — CBC WITH DIFFERENTIAL/PLATELET
Eosinophils Absolute: 0.1 10*3/uL (ref 0.0–0.7)
Eosinophils Relative: 2 % (ref 0–5)
Lymphocytes Relative: 18 % (ref 12–46)
Lymphs Abs: 1.2 10*3/uL (ref 0.7–4.0)
MCHC: 33.4 g/dL (ref 30.0–36.0)
Monocytes Absolute: 0.5 10*3/uL (ref 0.1–1.0)
Monocytes Relative: 7 % (ref 3–12)
Platelets: 156 10*3/uL (ref 150–400)
RDW: 13.3 % (ref 11.5–15.5)

## 2011-09-21 ENCOUNTER — Ambulatory Visit (INDEPENDENT_AMBULATORY_CARE_PROVIDER_SITE_OTHER): Payer: 59 | Admitting: Infectious Disease

## 2011-09-21 ENCOUNTER — Encounter: Payer: Self-pay | Admitting: Infectious Disease

## 2011-09-21 VITALS — BP 113/76 | HR 57 | Temp 98.0°F | Wt 193.0 lb

## 2011-09-21 DIAGNOSIS — L538 Other specified erythematous conditions: Secondary | ICD-10-CM

## 2011-09-21 DIAGNOSIS — L304 Erythema intertrigo: Secondary | ICD-10-CM

## 2011-09-21 DIAGNOSIS — C8589 Other specified types of non-Hodgkin lymphoma, extranodal and solid organ sites: Secondary | ICD-10-CM

## 2011-09-21 DIAGNOSIS — B20 Human immunodeficiency virus [HIV] disease: Secondary | ICD-10-CM

## 2011-09-21 DIAGNOSIS — D099 Carcinoma in situ, unspecified: Secondary | ICD-10-CM

## 2011-09-21 MED ORDER — FLUCONAZOLE 100 MG PO TABS: 100.0000 mg | ORAL_TABLET | Freq: Every day | ORAL | Status: AC

## 2011-09-21 MED ORDER — NYSTATIN 100000 UNIT/GM EX POWD: CUTANEOUS | Status: DC

## 2011-09-21 NOTE — Assessment & Plan Note (Signed)
Continue isentress and truvada. Considering stribild or complera

## 2011-09-21 NOTE — Assessment & Plan Note (Signed)
S/p excision

## 2011-09-21 NOTE — Assessment & Plan Note (Signed)
Doing well - no relapse 

## 2011-09-21 NOTE — Assessment & Plan Note (Signed)
Try fluconazole systemically and topical nystatin

## 2011-09-21 NOTE — Progress Notes (Signed)
  Subjective:    Patient ID: [name], male    DOB: [birth year], 46 y.o.   MRN: [id number]  HPI  [name]'s HIV is very nicely controlled on isentress and truvada. He was dx with squamous cell ca in situ sp excision by [facility]. He has had some drainage from surgical site and what seems to be intertrigo being rx with topical micanzole and PCN by Dermatology. Now getting worse. He has followed up with oncology and no relapse of his aggressive b cell lymphoma  Review of Systems  Constitutional: Negative for fever, chills, diaphoresis, activity change, appetite change, fatigue and unexpected weight change.  HENT: Negative for congestion, sore throat, rhinorrhea, sneezing, trouble swallowing and sinus pressure.   Eyes: Negative for photophobia and visual disturbance.  Respiratory: Negative for cough, chest tightness, shortness of breath, wheezing and stridor.   Cardiovascular: Negative for chest pain, palpitations and leg swelling.  Gastrointestinal: Negative for nausea, vomiting, abdominal pain, diarrhea, constipation, blood in stool, abdominal distention and anal bleeding.  Genitourinary: Negative for dysuria, hematuria, flank pain and difficulty urinating.  Musculoskeletal: Negative for myalgias, back pain, joint swelling, arthralgias and gait problem.  Skin: Positive for rash and wound. Negative for color change and pallor.  Neurological: Negative for dizziness, tremors, weakness and light-headedness.  Hematological: Negative for adenopathy. Does not bruise/bleed easily.  Psychiatric/Behavioral: Negative for behavioral problems, confusion, sleep disturbance, dysphoric mood, decreased concentration and agitation.       Objective:   Physical Exam  Constitutional: He is oriented to person, place, and time. He appears well-developed and well-nourished. No distress.  HENT:  Head: Normocephalic and atraumatic.  Mouth/Throat: Oropharynx is clear and moist. No oropharyngeal exudate.  Eyes:  Conjunctivae and EOM are normal. Pupils are equal, round, and reactive to light. No scleral icterus.  Neck: Normal range of motion. Neck supple. No JVD present.  Cardiovascular: Normal rate, regular rhythm and normal heart sounds.  Exam reveals no gallop and no friction rub.   No murmur heard. Pulmonary/Chest: Effort normal and breath sounds normal. No respiratory distress. He has no wheezes. He has no rales. He exhibits no tenderness.  Abdominal: He exhibits no distension and no mass. There is no tenderness. There is no rebound and no guarding.  Genitourinary:     Musculoskeletal: He exhibits no edema and no tenderness.  Lymphadenopathy:    He has no cervical adenopathy.  Neurological: He is alert and oriented to person, place, and time. He has normal reflexes. He exhibits normal muscle tone. Coordination normal.  Skin: Skin is warm and dry. He is not diaphoretic. No erythema. No pallor.  Psychiatric: He has a normal mood and affect. His behavior is normal. Judgment and thought content normal.          Assessment & Plan:  HIV INFECTION Continue isentress and truvada. Considering stribild or complera  NON-HODGKIN'S LYMPHOMA Doing well no relapse  Intertrigo Try fluconazole systemically and topical nystatin  Squamous cell carcinoma in situ Sp excision

## 2011-09-21 NOTE — Patient Instructions (Signed)
The other meds we are considering instead of isentress truvada are:  stribild   And   complera

## 2011-09-26 ENCOUNTER — Telehealth: Payer: Self-pay | Admitting: *Deleted

## 2011-09-26 NOTE — Telephone Encounter (Signed)
I ordered fluconazole for him is he taking that?

## 2011-09-26 NOTE — Telephone Encounter (Signed)
States he is "mostly better" but his rectal area is very painful & irritated. Stopped the powder & is still using the ointment. Bleeds when he stools. Does not think he needs to come back & thinks he may need an antibiotic. Told him that can make the yeast worse. He was leaving a gauze pad in the area. I told him to not do that as it will further irritate the area. States he is going to call his dermatologist. I told him if he cannot get seen promptly, call us & we can get him in this week.    FYI to MD

## 2011-09-27 NOTE — Telephone Encounter (Signed)
Still taking the fluconazole. Went to dermatologist yesterday & he was told to stop the powder & use A&D. Took viral & bacterial culture.  dermatologist prescribed Levaquin. States he feels "a little bit better" I asked him to sign a release so we can get the info from the dermatologist

## 2011-11-07 ENCOUNTER — Ambulatory Visit (HOSPITAL_COMMUNITY)
Admission: RE | Admit: 2011-11-07 | Discharge: 2011-11-07 | Disposition: A | Discharge status: Home / Self Care | Payer: 59 | Source: Ambulatory Visit | Attending: Nurse Practitioner | Admitting: Nurse Practitioner

## 2011-11-07 ENCOUNTER — Other Ambulatory Visit (HOSPITAL_BASED_OUTPATIENT_CLINIC_OR_DEPARTMENT_OTHER): Payer: 59 | Admitting: Lab

## 2011-11-07 DIAGNOSIS — B20 Human immunodeficiency virus [HIV] disease: Secondary | ICD-10-CM

## 2011-11-07 DIAGNOSIS — Z9221 Personal history of antineoplastic chemotherapy: Secondary | ICD-10-CM | POA: Insufficient documentation

## 2011-11-07 DIAGNOSIS — C8589 Other specified types of non-Hodgkin lymphoma, extranodal and solid organ sites: Secondary | ICD-10-CM

## 2011-11-07 LAB — CMP (CANCER CENTER ONLY)
ALT(SGPT): 38 U/L (ref 10–47)
Albumin: 3.7 g/dL (ref 3.3–5.5)
CO2: 30 mEq/L (ref 18–33)
Calcium: 8.8 mg/dL (ref 8.0–10.3)
Chloride: 102 mEq/L (ref 98–108)
Creat: 1.3 mg/dl — ABNORMAL HIGH (ref 0.6–1.2)
Potassium: 4.8 mEq/L — ABNORMAL HIGH (ref 3.3–4.7)
Total Protein: 7.6 g/dL (ref 6.4–8.1)

## 2011-11-07 LAB — LACTATE DEHYDROGENASE: LDH: 128 U/L (ref 94–250)

## 2011-11-07 LAB — CBC WITH DIFFERENTIAL/PLATELET
Basophils Absolute: 0 10*3/uL (ref 0.0–0.1)
EOS%: 3.3 % (ref 0.0–7.0)
HGB: 14.9 g/dL (ref 13.0–17.1)
MCH: 34 pg — ABNORMAL HIGH (ref 27.2–33.4)
NEUT#: 2.6 10*3/uL (ref 1.5–6.5)
RDW: 13.8 % (ref 11.0–14.6)
lymph#: 1 10*3/uL (ref 0.9–3.3)

## 2011-11-07 LAB — SEDIMENTATION RATE: Sed Rate: 7 mm/hr (ref 0–16)

## 2011-11-07 MED ORDER — IOHEXOL 300 MG/ML  SOLN
100.0000 mL | Freq: Once | INTRAMUSCULAR | Status: AC | PRN
  Administered 2011-11-07: 100 mL via INTRAVENOUS

## 2011-11-09 ENCOUNTER — Other Ambulatory Visit: Payer: Self-pay | Admitting: *Deleted

## 2011-11-09 DIAGNOSIS — B20 Human immunodeficiency virus [HIV] disease: Secondary | ICD-10-CM

## 2011-11-09 MED ORDER — RALTEGRAVIR POTASSIUM 400 MG PO TABS: 400.0000 mg | ORAL_TABLET | Freq: Two times a day (BID) | ORAL | Status: DC

## 2011-11-09 MED ORDER — EMTRICITABINE-TENOFOVIR DF 200-300 MG PO TABS: 1.0000 | ORAL_TABLET | Freq: Every day | ORAL | Status: DC

## 2011-11-10 ENCOUNTER — Telehealth: Payer: Self-pay | Admitting: *Deleted

## 2011-11-10 NOTE — Telephone Encounter (Signed)
Message copied by Sabino Snipes on Thu Nov 10, 2011  3:54 PM ------      Message from: Levert Feinstein      Created: Wed Nov 09, 2011  9:04 AM       Call pt CT negative for lymphoma

## 2011-11-10 NOTE — Telephone Encounter (Signed)
Message left on pt's identified vm/cell that CT neg for lymphoma per Dr. Cyndie Chime.

## 2011-11-11 ENCOUNTER — Telehealth: Payer: Self-pay | Admitting: Oncology

## 2011-11-11 ENCOUNTER — Ambulatory Visit (HOSPITAL_BASED_OUTPATIENT_CLINIC_OR_DEPARTMENT_OTHER): Payer: 59 | Admitting: Oncology

## 2011-11-11 VITALS — BP 118/78 | HR 69 | Temp 97.9°F | Ht 69.0 in | Wt 200.6 lb

## 2011-11-11 DIAGNOSIS — Z85048 Personal history of other malignant neoplasm of rectum, rectosigmoid junction, and anus: Secondary | ICD-10-CM

## 2011-11-11 DIAGNOSIS — B20 Human immunodeficiency virus [HIV] disease: Secondary | ICD-10-CM

## 2011-11-11 DIAGNOSIS — C8589 Other specified types of non-Hodgkin lymphoma, extranodal and solid organ sites: Secondary | ICD-10-CM

## 2011-11-11 DIAGNOSIS — C8510 Unspecified B-cell lymphoma, unspecified site: Secondary | ICD-10-CM

## 2011-11-11 NOTE — Progress Notes (Signed)
Hematology and Oncology Follow Up Visit  [name] [id number] 06-20-1965 46 y.o. 11/11/2011 6:18 PM   Principle Diagnosis: Encounter Diagnoses  Name Primary?  . High Grade B-Cell Lymphoma Yes  . 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 He initially presented with a cough and right pulmonary infiltrate. Pulmonary evaluation and bronchoscopy with biopsies on 07/02/2010 with nondiagnostic findings. Pathology showed marked inflammation and atypical cells. A lymphoproliferative process was suspected. PET scan showed intense uptake in the right upper lung, right nasal and oropharynx and additional areas in the left upper lung, right mediastinum, right hilar and precarinal areas. There was a single hypermetabolic focus in the right lobe of the liver. CT-guided biopsy of the liver lesion 07/20/2010 showed a high-grade B cell non-Hodgkin's lymphoma. The tissue was negative for CD20 and CD79a on the initial report but positive for CD45 and CD45 RB. Bone marrow biopsy was negative. Serum LDH and uric acid were normal. 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 Due to due is HIV positive status and apparent oropharyngeal involvement with lymphoma a lumbar puncture was done on 07/29/2010 and was negative for lymphoma. He completed 6 cycles of CHOP chemotherapy 07/30/2010 through 11/12/2010 followed by a course of prophylactic intrathecal methotrexate.  Restaging CT scans of the chest and abdomen on 07/12/2011 showed no evidence of lymphomatous involvement in the chest or abdomen. Small retroperitoneal nodes measuring up to 7 mm were again noted.  Repeat scans in anticipation of today's visit on 11/07/2011 which I personally reviewed remained stable with no evidence for new or progressive disease. There has been complete resolution of all pulmonary pathology in the  right lung.  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 He developed a recent perirectal rash. Cultures and biopsies were done. He reports that culture showed Staphylococcus. Negative for herpes simplex.  He just had a followup visit with his infectious disease specialist last month. The rash which the patient to a Staphylococcus was actually yeast infection and he was treated with fluconazole plus topical nystatin.  His HIV disease remains stable on Icentress with recent CD4 count 300.  He has had no other interim problems.    Medications: reviewed  Allergies:  Allergies  Allergen Reactions  . Sulfonamide Derivatives     REACTION: rash, swelling, trouble breathing    Review of Systems: Constitutional:   Appetite is good and he has gained 40 pounds since stopping chemotherapy Respiratory: No cough or dyspnea Cardiovascular:  No chest pain or palpitations Gastrointestinal: No abdominal pain or change in bowel habit Genito-Urinary: No urinary tract symptoms Musculoskeletal: No muscle or bone pain Neurologic: No headache or change in vision Skin: See above Remaining ROS negative.  Physical Exam: Blood pressure 118/78, pulse 69, temperature 97.9 F (36.6 C), temperature source Oral, height 5\' 9"  (1.753 m), weight 200 lb 9.6 oz (90.992 kg). Wt Readings from Last 3 Encounters:  11/11/11 200 lb 9.6 oz (90.992 kg)  09/21/11 193 lb (87.544 kg)  07/19/11 190 lb 8 oz (86.41 kg)     General appearance: Well-nourished Caucasian man HENNT: Pharynx no erythema or exudate Lymph nodes: No cervical supraclavicular or axillary adenopathy Breasts: Lungs: Clear to auscultation resonant to percussion Heart: Regular rhythm no murmur Abdomen: Soft nontender no mass no organomegaly Extremities: No edema no calf tenderness  Vascular: No cyanosis Neurologic: Mental status intact, cranial nerves intact, pupils equal round  reactive to light, optic disc sharp, vessels normal, motor strength 5 over 5, reflexes 1+ symmetric. Skin: No rash or ecchymosis. Perineal area not examined  Lab Results: Lab Results  Component Value Date   WBC 4.1 11/07/2011   HGB 14.9 11/07/2011   HCT 43.4 11/07/2011   MCV 99.0* 11/07/2011   PLT 118* 11/07/2011     Chemistry      Component Value Date/Time   NA 145 11/07/2011 0830   NA 141 09/07/2011 0929   K 4.8* 11/07/2011 0830   K 4.5 09/07/2011 0929   CL 102 11/07/2011 0830   CL 104 09/07/2011 0929   CO2 30 11/07/2011 0830   CO2 27 09/07/2011 0929   BUN 13 11/07/2011 0830   BUN 14 09/07/2011 0929   CREATININE 1.3* 11/07/2011 0830   CREATININE 1.09 07/19/2011 1324      Component Value Date/Time   CALCIUM 8.8 11/07/2011 0830   CALCIUM 9.8 09/07/2011 0929   ALKPHOS 105* 11/07/2011 0830   ALKPHOS 173* 09/07/2011 0929   AST 33 11/07/2011 0830   AST 29 09/07/2011 0929   ALT 65* 09/07/2011 0929   BILITOT 0.70 11/07/2011 0830   BILITOT 0.5 09/07/2011 1610       Radiological Studies: See discussion above.  Impression and Plan:  #1. Stage IVB  high-grade , B-cell,  non-Hodgkin's lymphoma treated as outlined above. He remains in clinical and radiographic remission now out 15 months from diagnosis. I will see him again in 6 months with lab and a CT scan prior to the visit.  #2. HIV/AIDS. Controlled on current antiretroviral drug.  #3. Squamous cell carcinoma in situ of the anus treated with wide surgical excision.  #4.  recent perineal Candida infection.   CC:. Dr. Coralie Carpen, MD 6/7/20136:18 PM

## 2011-11-11 NOTE — Telephone Encounter (Signed)
Gv pt appt for dec2013.  schedule ct scan for 12/04 @ [facility]

## 2011-12-01 ENCOUNTER — Telehealth: Payer: Self-pay | Admitting: *Deleted

## 2011-12-01 NOTE — Telephone Encounter (Signed)
Patient called because he is having issues again with anal bleeding.  After talking with him he is going to call the surgeon and get an appointment to be seen again. Wendall Mola CMA

## 2011-12-01 NOTE — Telephone Encounter (Signed)
I think that would be wise. THanks!

## 2012-01-11 ENCOUNTER — Ambulatory Visit: Payer: 59 | Admitting: Infectious Disease

## 2012-02-01 ENCOUNTER — Other Ambulatory Visit: Payer: Self-pay | Admitting: Infectious Disease

## 2012-02-01 DIAGNOSIS — Z113 Encounter for screening for infections with a predominantly sexual mode of transmission: Secondary | ICD-10-CM

## 2012-02-07 ENCOUNTER — Other Ambulatory Visit (INDEPENDENT_AMBULATORY_CARE_PROVIDER_SITE_OTHER): Payer: 59

## 2012-02-07 DIAGNOSIS — B20 Human immunodeficiency virus [HIV] disease: Secondary | ICD-10-CM

## 2012-02-07 DIAGNOSIS — Z113 Encounter for screening for infections with a predominantly sexual mode of transmission: Secondary | ICD-10-CM

## 2012-02-07 LAB — CBC WITH DIFFERENTIAL/PLATELET
Basophils Absolute: 0 10*3/uL (ref 0.0–0.1)
Lymphocytes Relative: 23 % (ref 12–46)
Neutro Abs: 4.7 10*3/uL (ref 1.7–7.7)
Neutrophils Relative %: 67 % (ref 43–77)
Platelets: 161 10*3/uL (ref 150–400)
RDW: 14 % (ref 11.5–15.5)
WBC: 7 10*3/uL (ref 4.0–10.5)

## 2012-02-07 LAB — LIPID PANEL
HDL: 33 mg/dL — ABNORMAL LOW (ref >39)
LDL Cholesterol: 98 mg/dL (ref 0–99)
Total CHOL/HDL Ratio: 5.8 Ratio
Triglycerides: 301 mg/dL — ABNORMAL HIGH (ref <150)

## 2012-02-08 LAB — COMPLETE METABOLIC PANEL WITH GFR
ALT: 36 U/L (ref 0–53)
Alkaline Phosphatase: 105 U/L (ref 39–117)
Creat: 0.99 mg/dL (ref 0.50–1.35)
GFR, Est Non African American: >89 mL/min
Sodium: 139 mEq/L (ref 135–145)
Total Bilirubin: 0.5 mg/dL (ref 0.3–1.2)
Total Protein: 7.4 g/dL (ref 6.0–8.3)

## 2012-02-08 LAB — RPR

## 2012-02-09 LAB — HIV-1 RNA QUANT-NO REFLEX-BLD: HIV-1 RNA Quant, Log: <1.3 {Log} (ref <1.30)

## 2012-02-22 ENCOUNTER — Ambulatory Visit (INDEPENDENT_AMBULATORY_CARE_PROVIDER_SITE_OTHER): Payer: 59 | Admitting: Infectious Disease

## 2012-02-22 ENCOUNTER — Encounter: Payer: Self-pay | Admitting: Infectious Disease

## 2012-02-22 VITALS — BP 113/78 | HR 65 | Temp 97.3°F | Wt 204.0 lb

## 2012-02-22 DIAGNOSIS — B20 Human immunodeficiency virus [HIV] disease: Secondary | ICD-10-CM

## 2012-02-22 DIAGNOSIS — T148XXA Other injury of unspecified body region, initial encounter: Secondary | ICD-10-CM | POA: Insufficient documentation

## 2012-02-22 DIAGNOSIS — C8589 Other specified types of non-Hodgkin lymphoma, extranodal and solid organ sites: Secondary | ICD-10-CM

## 2012-02-22 DIAGNOSIS — R51 Headache: Secondary | ICD-10-CM

## 2012-02-22 DIAGNOSIS — Z23 Encounter for immunization: Secondary | ICD-10-CM

## 2012-02-22 DIAGNOSIS — D099 Carcinoma in situ, unspecified: Secondary | ICD-10-CM

## 2012-02-22 MED ORDER — CYCLOBENZAPRINE HCL 5 MG PO TABS: 5.0000 mg | ORAL_TABLET | Freq: Three times a day (TID) | ORAL | Status: DC | PRN

## 2012-02-22 NOTE — Assessment & Plan Note (Signed)
Recommended massage the muscle and also prescribed Flexeril

## 2012-02-22 NOTE — Assessment & Plan Note (Signed)
Perfect control! Other options have been discussed will continue on Isentress and Truvada.

## 2012-02-22 NOTE — Progress Notes (Signed)
Subjective:    Patient ID: [name], male    DOB: 1966/02/05, 46 y.o.   MRN: [id number]  HPI  [name] is a 46 y.o. male who is doing superbly well on his  antiviral regimen, isentress and truvada with undetectable viral load and health cd4 coun above 300. His NHL has not recurred. He has had excision of his squamous cell carcinoma in site and he has been seen by Dr. Toni Arthurs [facility]) who has performed anoscopy.   He continues to have problems with peri-anal rash that has been evaluated by Dr. Jorja Loa. This is NOT due to drug reaction in my opnion. I have encouraged him to meet with Dr. Jorja Loa re this. He also does have a pulled muscle in neck which I will give flexeril.  We discussed other possible options for ARV for him  Including once daily TIVICAY with TRUVADA vs once daily COMPLERA (with food and no antacids) vs STRIBILD--though latter would be more problematic if he needed chemo again.   I spent greater than 45 minutes with the patient including greater than 50% of time in face to face counsel of the patient and in coordination of their care.    Review of Systems  Constitutional: Negative for fever, chills, diaphoresis, activity change, appetite change, fatigue and unexpected weight change.  HENT: Negative for congestion, sore throat, rhinorrhea, sneezing, trouble swallowing and sinus pressure.   Eyes: Negative for photophobia and visual disturbance.  Respiratory: Negative for cough, chest tightness, shortness of breath, wheezing and stridor.   Cardiovascular: Negative for chest pain, palpitations and leg swelling.  Gastrointestinal: Negative for nausea, vomiting, abdominal pain, diarrhea, constipation, blood in stool, abdominal distention and anal bleeding.  Genitourinary: Negative for dysuria, hematuria, flank pain and difficulty urinating.  Musculoskeletal: Negative for myalgias, back pain, joint swelling, arthralgias and gait problem.  Skin: Positive for rash.  Negative for color change, pallor and wound.  Neurological: Negative for dizziness, tremors, weakness and light-headedness.  Hematological: Negative for adenopathy. Does not bruise/bleed easily.  Psychiatric/Behavioral: Negative for behavioral problems, confusion, disturbed wake/sleep cycle, dysphoric mood, decreased concentration and agitation.       Objective:   Physical Exam  Constitutional: He is oriented to person, place, and time. He appears well-developed and well-nourished. No distress.  HENT:  Head: Normocephalic and atraumatic.  Mouth/Throat: Oropharynx is clear and moist. No oropharyngeal exudate.  Eyes: Conjunctivae normal and EOM are normal. Pupils are equal, round, and reactive to light. No scleral icterus.  Neck: Normal range of motion. Neck supple. No JVD present.  Cardiovascular: Normal rate, regular rhythm and normal heart sounds.  Exam reveals no gallop and no friction rub.   No murmur heard. Pulmonary/Chest: Effort normal and breath sounds normal. No respiratory distress. He has no wheezes. He has no rales. He exhibits no tenderness.  Abdominal: He exhibits no distension and no mass. There is no tenderness. There is no rebound and no guarding.  Musculoskeletal: He exhibits no edema and no tenderness.       Cervical back: He exhibits tenderness and pain. He exhibits no swelling, no edema, no deformity and no laceration.  Lymphadenopathy:    He has no cervical adenopathy.  Neurological: He is alert and oriented to person, place, and time. He has normal reflexes. He exhibits normal muscle tone. Coordination normal.  Skin: Skin is warm and dry. He is not diaphoretic. No erythema. No pallor.  Psychiatric: He has a normal mood and affect. His behavior is normal. Judgment and thought content  normal.          Assessment & Plan:  HIV INFECTION Perfect control! Other options have been discussed will continue on Isentress and Truvada.  NON-HODGKIN'S LYMPHOMA In  remission.  Squamous cell carcinoma in situ Make sure that he continues to have surveillance Pap smears and if continues to follow Dr. Toni Arthurs  Pulled muscle Recommended massage the muscle and also prescribed Flexeril

## 2012-02-22 NOTE — Assessment & Plan Note (Signed)
In remission.

## 2012-02-22 NOTE — Assessment & Plan Note (Signed)
Make sure that he continues to have surveillance Pap smears and if continues to follow Dr. Toni Arthurs

## 2012-04-25 ENCOUNTER — Telehealth: Payer: Self-pay | Admitting: Internal Medicine

## 2012-04-25 NOTE — Telephone Encounter (Signed)
The patient called and is hoping to get in for a new patient appointment.  He has one scheduled for March, but states he is having bleeding from his anus and is hoping to have that evaluated.  Please advise if you want him added onto your schedule sooner.   Thanks!   [phone]

## 2012-04-25 NOTE — Telephone Encounter (Signed)
Ok to work in- thanks 

## 2012-04-27 ENCOUNTER — Encounter: Payer: Self-pay | Admitting: Internal Medicine

## 2012-04-27 ENCOUNTER — Ambulatory Visit (INDEPENDENT_AMBULATORY_CARE_PROVIDER_SITE_OTHER): Payer: 59 | Admitting: Internal Medicine

## 2012-04-27 VITALS — BP 110/72 | HR 60 | Temp 97.3°F | Ht 69.0 in | Wt 192.1 lb

## 2012-04-27 DIAGNOSIS — B20 Human immunodeficiency virus [HIV] disease: Secondary | ICD-10-CM

## 2012-04-27 DIAGNOSIS — K6289 Other specified diseases of anus and rectum: Secondary | ICD-10-CM

## 2012-04-27 DIAGNOSIS — C8589 Other specified types of non-Hodgkin lymphoma, extranodal and solid organ sites: Secondary | ICD-10-CM

## 2012-04-27 DIAGNOSIS — K625 Hemorrhage of anus and rectum: Secondary | ICD-10-CM

## 2012-04-27 DIAGNOSIS — K602 Anal fissure, unspecified: Secondary | ICD-10-CM

## 2012-04-27 DIAGNOSIS — D099 Carcinoma in situ, unspecified: Secondary | ICD-10-CM

## 2012-04-27 MED ORDER — NITROGLYCERIN 2 % TD OINT: 0.5000 [in_us] | TOPICAL_OINTMENT | Freq: Two times a day (BID) | TRANSDERMAL | Status: DC

## 2012-04-27 MED ORDER — DILTIAZEM GEL 2 %: 1.0000 "application " | Freq: Three times a day (TID) | CUTANEOUS | Status: DC

## 2012-04-27 NOTE — Patient Instructions (Addendum)
It was good to see you today. We have reviewed your prior records including labs and tests today Medications reviewed and updated Use intraanal nitroglycerin 2x/day for 2 weeks, then try intraanal Diltiazem 3x/day x 2 weeks IF nitroglycerin ineffective - Your prescription(s) have been submitted to your pharmacy. Please take as directed and contact our office if you believe you are having problem(s) with the medication(s). Please schedule followup in 6 weeks for review, call sooner if problems. Anal Fissure, Adult An anal fissure is a small tear or crack in the skin around the anus. Bleeding from a fissure usually stops on its own within a few minutes. However, bleeding will often reoccur with each bowel movement until the crack heals.   CAUSES    Passing large, hard stools.   Frequent diarrheal stools.   Constipation.   Inflammatory bowel disease (Crohn's disease or ulcerative colitis).   Infections.   Anal sex.  SYMPTOMS    Small amounts of blood seen on your stools, on toilet paper, or in the toilet after a bowel movement.   Rectal bleeding.   Painful bowel movements.   Itching or irritation around the anus.  DIAGNOSIS  Your caregiver will examine the anal area. An anal fissure can usually be seen with careful inspection. A rectal exam may be performed and a short tube (anoscope) may be used to examine the anal canal. TREATMENT    You may be instructed to take fiber supplements. These supplements can soften your stool to help make bowel movements easier.   Sitz baths may be recommended to help heal the tear. Do not use soap in the sitz baths.   A medicated cream or ointment may be prescribed to lessen discomfort.  HOME CARE INSTRUCTIONS    Maintain a diet high in fruits, whole grains, and vegetables. Avoid constipating foods like bananas and dairy products.   Take sitz baths as directed by your caregiver.   Drink enough fluids to keep your urine clear or pale yellow.    Only take over-the-counter or prescription medicines for pain, discomfort, or fever as directed by your caregiver. Do not take aspirin as this may increase bleeding.   Do not use ointments containing numbing medications (anesthetics) or hydrocortisone. They could slow healing.  SEEK MEDICAL CARE IF:    Your fissure is not completely healed within 3 days.   You have further bleeding.   You have a fever.   You have diarrhea mixed with blood.   You have pain.   Your problem is getting worse rather than better.  MAKE SURE YOU:    Understand these instructions.   Will watch your condition.   Will get help right away if you are not doing well or get worse.  Document Released: 05/23/2005 Document Revised: 08/15/2011 Document Reviewed: 11/07/2010 [facility] Patient Information 2013 [place], [place].

## 2012-04-27 NOTE — Progress Notes (Signed)
Subjective:    Patient ID: [name], male    DOB: 1966-04-30, 46 y.o.   MRN: [id number]  HPI  New pt to me and our divison - here to establish with PCP  complains of rectal bleeding Hx same associated with hemorrhoid and anal fissure Current episode began 4 weeks ago, now improved but not resolved Describes as bright red blood per rectum with anal leakage/soilage Also associated with continuous rectal pain Denies anal intercourse since 2007, no other rectal trauma Prior evaluations in past 18 months have included colonoscopy, sigmoidoscopy, wide excision resection of anal cancer and dermatology treatment of perianal rash Ineffective treatments have included penicillin, FQs, antifungal, increase in antiviral, A&D, barrier cream and topical steroids  Past Medical History  Diagnosis Date  . HIV positive   . ALLERGIC RHINITIS   . Anal fissure     Chronic, recurrent anal bleeding with pain  . Depression   . HEADACHE, CHRONIC   . Hemangioma   . Hemorrhoid   . HIV INFECTION 07/05/2010 dx  . Intertrigo   . Molluscum contagiosum   . NEUTROPENIA, DRUG-INDUCED   . 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 Thrombocytopenia    Family History  Problem Relation Age of Onset  . Cancer Mother     Breast  . Alcohol abuse Other   . Cancer Other   . Hypertension Other   . Hyperlipidemia Other    History  Substance Use Topics  . Smoking status: Former Games developer  . Smokeless tobacco: Not on file     Comment: married to male partner; employed at [employer]  . Alcohol Use: Yes     Comment: occasional    Review of Systems Constitutional: Negative for fever or weight change.  Respiratory: Negative for cough and shortness of breath.   Cardiovascular: Negative for chest  pain or palpitations.  Gastrointestinal: Negative for abdominal pain, no bowel changes.  Musculoskeletal: Negative for gait problem or joint swelling.  Skin: Negative for rash.  Neurological: Negative for dizziness or headache.  No other specific complaints in a complete review of systems (except as listed in HPI above).     Objective:   Physical Exam BP 110/72  Pulse 60  Temp 97.3 F (36.3 C) (Oral)  Ht 5\' 9"  (1.753 m)  Wt 192 lb 1.9 oz (87.145 kg)  BMI 28.37 kg/m2  SpO2 95% Wt Readings from Last 3 Encounters:  04/27/12 192 lb 1.9 oz (87.145 kg)  02/22/12 204 lb (92.534 kg)  11/11/11 200 lb 9.6 oz (90.992 kg)   Constitutional:  He appears well-developed and well-nourished. No distress.  HENT: Normocephalic atraumatic, sinuses nontender. Ears with clear tympanic membranes bilaterally, no effusion or erythema. No cerumen. Nose normal; oropharynx clear without exudate or erythema -dentition in good repair Eyes: Vision grossly intact bilaterally. PERRL, EOMI -no conjunctivitis Neck: Normal range of motion. Neck supple. No JVD or LAD present. No thyromegaly present.  Cardiovascular: Normal rate, regular rhythm and normal heart sounds.  No murmur heard. no BLE edema Pulmonary/Chest: Effort normal and breath sounds normal. No respiratory distress. no wheezes.  Abdominal: Soft. Bowel sounds are normal. Patient exhibits no distension. There is no tenderness.  Rectal: Supervised by Brenton Grills, CMA : Diffuse mild redness or perianal rash -no external hemorrhoids  -  tender with soft tissue fullness and pain at 2:00 position -trace brown stool, guaiac-negative  Musculoskeletal: Normal range of motion. Patient exhibits no joint effusions or gross deformities.  Neurological: he is alert and oriented to person, place, and time. No cranial nerve deficit. Coordination normal.  Skin: Perianal rash - confluent pink skin which appears consistent with candidal type rash -remaining skin is warm and  dry.  No erythema or ulceration.  Psychiatric: he has a slightly anxious mood and affect. behavior is normal. Judgment and thought content normal.   Lab Results  Component Value Date   WBC 7.0 02/07/2012   HGB 15.4 02/07/2012   HCT 45.1 02/07/2012   PLT 161 02/07/2012   GLUCOSE 83 02/07/2012   CHOL 191 02/07/2012   TRIG 301* 02/07/2012   HDL 33* 02/07/2012   LDLCALC 98 02/07/2012   ALT 36 02/07/2012   AST 24 02/07/2012   NA 139 02/07/2012   K 5.0 02/07/2012   CL 101 02/07/2012   CREATININE 0.99 02/07/2012   BUN 17 02/07/2012   CO2 29 02/07/2012   TSH 0.572 08/09/2010   INR 0.96 03/21/2011       Assessment & Plan:   Recurrent rectal bleeding with anal pain -  history of anal fissure per review of prior colonoscopy report Also history of squamous cell carcinoma of anus, status post wide excision January 2013 at [facility] Perianal rash clinically concerning for candidiasis -prior biopsy and culture at derm (tafeen) consistent with group B strep, no HSV per patient report  Treatment now for anal fissure with topical nitroglycerin and diltiazem Encouraged to resume antifungal therapy with Monistat over-the-counter cream or nystatin powder Consider followup with gastroenterology at [facility]), last office visit September 2012 reviewed in EMR today Last followup with colorectal surgeon at [facility] Toni Arthurs) July 2013- reviewed in EMR  Time spent with pt today 45 minutes, greater than 50% time spent counseling patient on anal bleeding/pain and medication review. Also extensive review of prior records

## 2012-04-28 ENCOUNTER — Encounter: Payer: Self-pay | Admitting: Internal Medicine

## 2012-04-28 NOTE — Assessment & Plan Note (Signed)
Interval history reviewed in depth In remission Follows with oncology every 6 months for same

## 2012-04-28 NOTE — Assessment & Plan Note (Signed)
Status post wide excision January 2013 of [facility] -anal cancer Follows at [facility] with Dr. Toni Arthurs -colorectal surgeon

## 2012-04-28 NOTE — Assessment & Plan Note (Signed)
Interval history reviewed Follows with infectious disease for same, currently well-controlled Continue antiviral therapy as prescribed 

## 2012-05-09 ENCOUNTER — Telehealth: Payer: Self-pay | Admitting: *Deleted

## 2012-05-09 ENCOUNTER — Ambulatory Visit (HOSPITAL_COMMUNITY)
Admission: RE | Admit: 2012-05-09 | Discharge: 2012-05-09 | Disposition: A | Discharge status: Home / Self Care | Payer: 59 | Source: Ambulatory Visit | Attending: Oncology | Admitting: Oncology

## 2012-05-09 ENCOUNTER — Other Ambulatory Visit (HOSPITAL_BASED_OUTPATIENT_CLINIC_OR_DEPARTMENT_OTHER): Payer: 59 | Admitting: Lab

## 2012-05-09 ENCOUNTER — Encounter: Payer: Self-pay | Admitting: Oncology

## 2012-05-09 DIAGNOSIS — C8510 Unspecified B-cell lymphoma, unspecified site: Secondary | ICD-10-CM

## 2012-05-09 DIAGNOSIS — B20 Human immunodeficiency virus [HIV] disease: Secondary | ICD-10-CM | POA: Insufficient documentation

## 2012-05-09 DIAGNOSIS — C8589 Other specified types of non-Hodgkin lymphoma, extranodal and solid organ sites: Secondary | ICD-10-CM | POA: Insufficient documentation

## 2012-05-09 DIAGNOSIS — Z9221 Personal history of antineoplastic chemotherapy: Secondary | ICD-10-CM | POA: Insufficient documentation

## 2012-05-09 LAB — CBC WITH DIFFERENTIAL/PLATELET
Eosinophils Absolute: 0.1 10*3/uL (ref 0.0–0.5)
HCT: 46.2 % (ref 38.4–49.9)
LYMPH%: 32 % (ref 14.0–49.0)
MCV: 99.5 fL — ABNORMAL HIGH (ref 79.3–98.0)
MONO#: 0.3 10*3/uL (ref 0.1–0.9)
MONO%: 7.9 % (ref 0.0–14.0)
NEUT#: 2.2 10*3/uL (ref 1.5–6.5)
NEUT%: 57.4 % (ref 39.0–75.0)
Platelets: 124 10*3/uL — ABNORMAL LOW (ref 140–400)
RBC: 4.65 10*6/uL (ref 4.20–5.82)

## 2012-05-09 LAB — COMPREHENSIVE METABOLIC PANEL (CC13)
Alkaline Phosphatase: 104 U/L (ref 40–150)
BUN: 19 mg/dL (ref 7.0–26.0)
CO2: 25 mEq/L (ref 22–29)
Creatinine: 1 mg/dL (ref 0.7–1.3)
Glucose: 91 mg/dl (ref 70–99)
Sodium: 140 mEq/L (ref 136–145)
Total Bilirubin: 0.77 mg/dL (ref 0.20–1.20)
Total Protein: 7.7 g/dL (ref 6.4–8.3)

## 2012-05-09 LAB — LACTATE DEHYDROGENASE (CC13): LDH: 160 U/L (ref 125–245)

## 2012-05-09 MED ORDER — IOHEXOL 300 MG/ML  SOLN
100.0000 mL | Freq: Once | INTRAMUSCULAR | Status: AC | PRN
  Administered 2012-05-09: 100 mL via INTRAVENOUS

## 2012-05-09 NOTE — Progress Notes (Signed)
I called the patient to review results of CT scan chest abdomen and pelvis done earlier today. Radiologist read the films as showing recurrence of lymphoma. This is based on 2 small lymph nodes in the left pelvis an obturator node and a left iliac node. The patient never had any involvement in the abdomen or pelvis at the time of diagnosis in February 2012. 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 I believe these lymph nodes are reactive and do not represent lymphoma and I told this to the patient today. I will get a 3 month interval followup. Of note, this patient got the CT results at the same time that I did. We were told that this new system would allow the physician  10 days to review radiographic studies prior to the patient being able to access his or her own data. The current situation is not fair to the patient or to the physician.

## 2012-05-09 NOTE — Telephone Encounter (Signed)
Received call from [facility] radiology stating that CT shows enlarging left pelvic node since 03/13/11, suspicious of recurrent lymphoma.  Report to Dr Cyndie Chime.

## 2012-05-16 ENCOUNTER — Telehealth: Payer: Self-pay | Admitting: Oncology

## 2012-05-16 ENCOUNTER — Ambulatory Visit: Payer: 59 | Admitting: Oncology

## 2012-05-16 ENCOUNTER — Ambulatory Visit (HOSPITAL_BASED_OUTPATIENT_CLINIC_OR_DEPARTMENT_OTHER): Payer: 59 | Admitting: Nurse Practitioner

## 2012-05-16 VITALS — BP 119/75 | HR 50 | Temp 97.5°F | Resp 18 | Ht 69.0 in | Wt 189.3 lb

## 2012-05-16 DIAGNOSIS — B20 Human immunodeficiency virus [HIV] disease: Secondary | ICD-10-CM

## 2012-05-16 DIAGNOSIS — C8589 Other specified types of non-Hodgkin lymphoma, extranodal and solid organ sites: Secondary | ICD-10-CM

## 2012-05-16 DIAGNOSIS — Z85048 Personal history of other malignant neoplasm of rectum, rectosigmoid junction, and anus: Secondary | ICD-10-CM

## 2012-05-16 NOTE — Telephone Encounter (Signed)
appts made and printed for pt Pt aware the scan will be sch and he will be called,contrast given

## 2012-05-16 NOTE — Progress Notes (Signed)
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 He initially presented with a cough and right pulmonary infiltrate. Pulmonary evaluation and bronchoscopy with biopsies on 07/02/2010 with nondiagnostic findings. Pathology showed marked inflammation and atypical cells. A lymphoproliferative process was suspected. PET scan showed intense uptake in the right upper lung, right nasal and oropharynx and additional areas in the left upper lung, right mediastinum, right hilar and precarinal areas. There was a single hypermetabolic focus in the right lobe of the liver. CT-guided biopsy of the liver lesion 07/20/2010 showed a high-grade B cell non-Hodgkin's lymphoma. The tissue was negative for CD20 and CD79a on the initial report but positive for CD45 and CD45 RB. Bone marrow biopsy was negative. Serum LDH and uric acid were normal. 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 Due to due is HIV positive status and apparent oropharyngeal involvement with lymphoma a lumbar puncture was done on 07/29/2010 and was negative for lymphoma. He completed 6 cycles of CHOP chemotherapy 07/30/2010 through 11/12/2010 followed by a course of prophylactic intrathecal methotrexate.  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  Restaging CT evaluation 05/09/2012 showed no evidence of active lymphoma in the chest. A left external iliac node measured 1.2 x 2.1 cm compared with 0.6 x 1.2 cm on the prior study on 03/15/2011. A left obturator node measured 8 x 14 mm versus 4 x 9 mm on the prior study.  He is seen today for scheduled followup. No interim illnesses or infections. He denies fevers or sweats. He has a good appetite and good energy level. He is exercising regularly. He denies pain. No cough or shortness of  breath.   Objective: Blood pressure 119/75, pulse 50, temperature 97.5 F (36.4 C), temperature source Oral, resp. rate 18, height 5\' 9"  (1.753 m), weight 189 lb 4.8 oz (85.866 kg).  Oropharynx is without thrush or ulceration. No palpable cervical, supraclavicular, axillary or inguinal lymph nodes. Lungs are clear. No wheezes or rales. Regular cardiac rhythm. Abdomen is soft and nontender. No organomegaly. Extremities without edema. Calves are soft and nontender  Lab Results: Lab Results  Component Value Date   WBC 3.8* 05/09/2012   HGB 15.9 05/09/2012   HCT 46.2 05/09/2012   MCV 99.5* 05/09/2012   PLT 124* 05/09/2012    Chemistry:    Chemistry      Component Value Date/Time   NA 140 05/09/2012 0957   NA 139 02/07/2012 1425   NA 145 11/07/2011 0830   K 4.2 05/09/2012 0957   K 5.0 02/07/2012 1425   K 4.8* 11/07/2011 0830   CL 103 05/09/2012 0957   CL 101 02/07/2012 1425   CL 102 11/07/2011 0830   CO2 25 05/09/2012 0957   CO2 29 02/07/2012 1425   CO2 30 11/07/2011 0830   BUN 19.0 05/09/2012 0957   BUN 17 02/07/2012 1425   BUN 13 11/07/2011 0830   CREATININE 1.0 05/09/2012 0957   CREATININE 0.99 02/07/2012 1425   CREATININE 1.09 07/19/2011 1324      Component Value Date/Time   CALCIUM 9.6 05/09/2012 0957   CALCIUM 9.9 02/07/2012 1425   CALCIUM 8.8 11/07/2011 0830   ALKPHOS 104 05/09/2012 0957   ALKPHOS 105 02/07/2012 1425   ALKPHOS 105* 11/07/2011 0830   AST 27 05/09/2012 0957  AST 24 02/07/2012 1425   AST 33 11/07/2011 0830   ALT 33 05/09/2012 0957   ALT 36 02/07/2012 1425   BILITOT 0.77 05/09/2012 0957   BILITOT 0.5 02/07/2012 1425   BILITOT 0.70 11/07/2011 0830       Studies/Results: Ct Chest W Contrast  05/09/2012  *RADIOLOGY REPORT*  Clinical Data:  Non-Hodgkins lymphoma diagnosed 02/12. Chemotherapy complete.  No current complaints.  HIV/AIDS.  CT CHEST, ABDOMEN AND PELVIS WITH CONTRAST  Technique: Contiguous axial images of the chest abdomen and pelvis were obtained after IV contrast administration.   Contrast: 100  ml Omnipaque-300  Comparison: 11/07/2011 chest and abdomen CT.  The most recent pelvic CT of 03/15/2011.  CT CHEST  Findings: Lung windows demonstrate no nodules airspace opacities. Similar linear scarring in the right upper lobe.  Soft tissue windows demonstrate no supraclavicular adenopathy. Normal heart size without pericardial or pleural effusion.  No central pulmonary embolism, on this non-dedicated study.  No mediastinal or hilar adenopathy.  IMPRESSION: No acute process or evidence of active lymphoma within the chest.  CT ABDOMEN AND PELVIS  Findings:  Normal liver, spleen, stomach, pancreas, gallbladder, biliary tract, adrenal glands, kidneys.  Early aortic atherosclerosis. No retroperitoneal or retrocrural adenopathy.  Normal colon, appendix, and terminal ileum.  Normal small bowel without abdominal ascites.  A left external iliac node which measures 1.2 x 2.1 cm on image 111/series 2.  This is enlarged from 0.6 x 1.2 cm on the prior exam of 03/15/2011. A left obturator node measures 8 x 14 mm on image 106/series 2 versus 4 x 9 mm on the prior.  Normal urinary bladder and prostate.  No significant free fluid.  IMPRESSION: Enlarging left pelvic nodes since 03/15/2011, suspicious for recurrent lymphoma.  These results will be called to the ordering clinician or representative by the Radiologist Assistant, and communication documented in the PACS Dashboard.   Original Report Authenticated By: Jeronimo Greaves, M.D.    Ct Abdomen Pelvis W Contrast  05/09/2012  *RADIOLOGY REPORT*  Clinical Data:  Non-Hodgkins lymphoma diagnosed 02/12. Chemotherapy complete.  No current complaints.  HIV/AIDS.  CT CHEST, ABDOMEN AND PELVIS WITH CONTRAST  Technique: Contiguous axial images of the chest abdomen and pelvis were obtained after IV contrast administration.  Contrast: 100  ml Omnipaque-300  Comparison: 11/07/2011 chest and abdomen CT.  The most recent pelvic CT of 03/15/2011.  CT CHEST  Findings: Lung  windows demonstrate no nodules airspace opacities. Similar linear scarring in the right upper lobe.  Soft tissue windows demonstrate no supraclavicular adenopathy. Normal heart size without pericardial or pleural effusion.  No central pulmonary embolism, on this non-dedicated study.  No mediastinal or hilar adenopathy.  IMPRESSION: No acute process or evidence of active lymphoma within the chest.  CT ABDOMEN AND PELVIS  Findings:  Normal liver, spleen, stomach, pancreas, gallbladder, biliary tract, adrenal glands, kidneys.  Early aortic atherosclerosis. No retroperitoneal or retrocrural adenopathy.  Normal colon, appendix, and terminal ileum.  Normal small bowel without abdominal ascites.  A left external iliac node which measures 1.2 x 2.1 cm on image 111/series 2.  This is enlarged from 0.6 x 1.2 cm on the prior exam of 03/15/2011. A left obturator node measures 8 x 14 mm on image 106/series 2 versus 4 x 9 mm on the prior.  Normal urinary bladder and prostate.  No significant free fluid.  IMPRESSION: Enlarging left pelvic nodes since 03/15/2011, suspicious for recurrent lymphoma.  These results will be called to the ordering clinician or  representative by the Radiologist Assistant, and communication documented in the PACS Dashboard.   Original Report Authenticated By: Jeronimo Greaves, M.D.     Medications: I have reviewed the patient's current medications.  Assessment/Plan:  1. Stage IVB high-grade large cell CD20 negative non-Hodgkin's lymphoma diagnosed February 2012 status post 6 cycles of CHOP chemotherapy 07/30/2010 through 11/12/2010 followed by prophylactic intrathecal methotrexate. Recent restaging CT scans negative for evidence of recurrent lymphoma. 2. 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  3. Mild elevation of transaminases on labs 07/12/2011. Repeat lab on 07/19/2011 showed normal range. Labs done on 05/09/2012 also showed transaminases in normal  range. 4. HIV/AIDS followed by Dr. Daiva Eves. 5. Status post Port-A-Cath removal. 6. Enlarged left obturator and left iliac node on restaging CT evaluation 05/09/2012.  Disposition-[name] appears stable. Dr. Cyndie Chime recommends a three-month interval followup CT scan to evaluate the enlarged left pelvic lymph nodes. [name] will return for a followup visit one to 2 days after the scans to review the results. He will contact the office in the interim with any problems.  Plan reviewed with Dr. Cyndie Chime.  Lonna Cobb ANP/GNP-BC

## 2012-06-08 ENCOUNTER — Ambulatory Visit (INDEPENDENT_AMBULATORY_CARE_PROVIDER_SITE_OTHER): Payer: 59 | Admitting: Internal Medicine

## 2012-06-08 ENCOUNTER — Encounter: Payer: Self-pay | Admitting: Internal Medicine

## 2012-06-08 VITALS — BP 116/70 | HR 48 | Temp 98.1°F | Ht 69.0 in | Wt 188.8 lb

## 2012-06-08 DIAGNOSIS — F411 Generalized anxiety disorder: Secondary | ICD-10-CM

## 2012-06-08 DIAGNOSIS — K602 Anal fissure, unspecified: Secondary | ICD-10-CM

## 2012-06-08 DIAGNOSIS — F419 Anxiety disorder, unspecified: Secondary | ICD-10-CM | POA: Insufficient documentation

## 2012-06-08 DIAGNOSIS — Z23 Encounter for immunization: Secondary | ICD-10-CM

## 2012-06-08 MED ORDER — AMITRIPTYLINE HCL 10 MG PO TABS: 10.0000 mg | ORAL_TABLET | Freq: Every day | ORAL | Status: DC

## 2012-06-08 MED ORDER — CITALOPRAM HYDROBROMIDE 40 MG PO TABS: 40.0000 mg | ORAL_TABLET | Freq: Every day | ORAL | Status: DC

## 2012-06-08 MED ORDER — NITROGLYCERIN 2 % TD OINT: 0.5000 [in_us] | TOPICAL_OINTMENT | Freq: Two times a day (BID) | TRANSDERMAL | Status: DC | PRN

## 2012-06-08 MED ORDER — ALPRAZOLAM 0.25 MG PO TABS: 0.2500 mg | ORAL_TABLET | Freq: Two times a day (BID) | ORAL | Status: DC | PRN

## 2012-06-08 NOTE — Assessment & Plan Note (Signed)
Wishes to lessen "dependancy" on BZs -  reduce dose now to continue prn use and start amitriptyline qhs in place of xanax Continue SSRI - refills done

## 2012-06-08 NOTE — Progress Notes (Signed)
  Subjective:    Patient ID: [name], male    DOB: [birth year], 47 y.o.   MRN: [id number]  HPI  Here for follow up - rectal bleeding Hx same associated with hemorrhoid and anal fissure has improved with topical intraanal NTG qd- not using IA Dilt  Prior evaluations in past 18 months have included colonoscopy, sigmoidoscopy, wide excision resection of anal cancer and dermatology treatment of perianal rash Ineffective treatments have included penicillin, FQs, antifungal, increase in antiviral, A&D, barrier cream and topical steroids  Past Medical History  Diagnosis Date  . HIV positive   . ALLERGIC RHINITIS   . Anal fissure     Chronic, recurrent anal bleeding with pain  . Depression   . HEADACHE, CHRONIC   . Hemangioma   . Hemorrhoid   . HIV INFECTION 07/05/2010 dx  . Intertrigo   . Molluscum contagiosum   . NEUTROPENIA, DRUG-INDUCED   . 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 Thrombocytopenia    Review of Systems  Respiratory: Negative for cough and shortness of breath.   Cardiovascular: Negative for chest pain or palpitations.  Gastrointestinal: Negative for abdominal pain, no bowel changes.      Objective:   Physical Exam  BP 122/82  Pulse 48  Temp 98.1 F (36.7 C) (Oral)  Ht 5\' 9"  (1.753 m)  Wt 188 lb 12.8 oz (85.639 kg)  BMI 27.88 kg/m2  SpO2 97% Wt Readings from Last 3 Encounters:  06/08/12 188 lb 12.8 oz (85.639 kg)  05/16/12 189 lb 4.8 oz (85.866 kg)  04/27/12 192 lb 1.9 oz (87.145 kg)   Constitutional:  He appears well-developed and well-nourished. No distress.  Cardiovascular: Normal rate, regular rhythm and normal heart sounds.  No murmur heard. no BLE edema Pulmonary/Chest: Effort normal and breath sounds normal. No respiratory distress. no wheezes.    Rectal: defer today  Psychiatric: he has a slightly anxious mood and affect. behavior is normal. Judgment and thought content normal.   Lab Results  Component Value Date   WBC 3.8* 05/09/2012   HGB 15.9 05/09/2012   HCT 46.2 05/09/2012   PLT 124* 05/09/2012   GLUCOSE 91 05/09/2012   CHOL 191 02/07/2012   TRIG 301* 02/07/2012   HDL 33* 02/07/2012   LDLCALC 98 02/07/2012   ALT 33 05/09/2012   AST 27 05/09/2012   NA 140 05/09/2012   K 4.2 05/09/2012   CL 103 05/09/2012   CREATININE 1.0 05/09/2012   BUN 19.0 05/09/2012   CO2 25 05/09/2012   TSH 0.572 08/09/2010   INR 0.96 03/21/2011       Assessment & Plan:   See problem list. Medications and labs reviewed today.

## 2012-06-08 NOTE — Assessment & Plan Note (Signed)
  Recurrent rectal bleeding with anal pain -  history of anal fissure per review of prior colonoscopy report Also history of squamous cell carcinoma of anus, status post wide excision January 2013 at [facility] Perianal rash clinically concerning for candidiasis -prior biopsy and culture at derm (tafeen) consistent with group B strep, no HSV per patient report>> will stop Valtrex  S/p treatment 04/2012 topical nitroglycerin > improved Encouraged to conitnue antifungal therapy with Monistat over-the-counter cream or nystatin powder  Consider followup with gastroenterology at [facility]), last office visit September 2012 reviewed in EMR today Last followup with colorectal surgeon at [facility]) July 2013- reviewed in EMR

## 2012-06-08 NOTE — Patient Instructions (Signed)
It was good to see you today. We have reviewed your prior records including labs and tests today tetanus booster (tdap) given today Medications reviewed and updated - stop valtrex and use nitroglycerin only as needed Start amitriptyline at bedtime for sleep as needed and reduce dose alprazolam as discussed - Your prescription(s) have been submitted to your local pharmacy. Please take as directed and contact our office if you believe you are having problem(s) with the medication(s). Refill on citalopram sent to your mail order pharmacy  Please schedule followup in 6 months for review, call sooner if problems.

## 2012-07-09 ENCOUNTER — Telehealth: Payer: Self-pay | Admitting: *Deleted

## 2012-07-09 ENCOUNTER — Other Ambulatory Visit: Payer: Self-pay | Admitting: *Deleted

## 2012-07-09 ENCOUNTER — Encounter: Payer: Self-pay | Admitting: Nurse Practitioner

## 2012-07-09 ENCOUNTER — Encounter: Payer: Self-pay | Admitting: Infectious Disease

## 2012-07-09 ENCOUNTER — Encounter: Payer: Self-pay | Admitting: Internal Medicine

## 2012-07-09 MED ORDER — AMITRIPTYLINE HCL 10 MG PO TABS: 10.0000 mg | ORAL_TABLET | Freq: Every day | ORAL | Status: DC

## 2012-07-09 NOTE — Telephone Encounter (Signed)
From mychart email:  Message     Dr Daiva Eves,   I'm applying for a green card for my husband Susann Givens. We married on 02/27/12, but have been together since 07/2000. I'm asking for a waiver since he is present in the country without proper documentation. The waiver is based on the extreme hardship it would cause me if I didn't have him here, or if I was forced to move to [place] to be with him. I couldn't have made it without him the last two years. Hope you can help.      Thanks, Maurine Minister

## 2012-07-10 ENCOUNTER — Encounter: Payer: Self-pay | Admitting: Infectious Disease

## 2012-07-10 ENCOUNTER — Encounter: Payer: Self-pay | Admitting: Internal Medicine

## 2012-07-10 ENCOUNTER — Encounter: Payer: Self-pay | Admitting: *Deleted

## 2012-07-10 ENCOUNTER — Encounter: Payer: Self-pay | Admitting: Nurse Practitioner

## 2012-07-10 NOTE — Telephone Encounter (Signed)
It sounds like he would like [place] to write a letter which I would be happy to do. Are there specifics that he would like for me to include in this letter?

## 2012-07-11 NOTE — Telephone Encounter (Signed)
Eber Jones, will you please construct a letter on my behalf stating same - medical request for waiver due to "extreme hardship" as described - ok to include the detail as here

## 2012-07-12 ENCOUNTER — Encounter: Payer: Self-pay | Admitting: Internal Medicine

## 2012-07-20 ENCOUNTER — Telehealth: Payer: Self-pay | Admitting: *Deleted

## 2012-07-20 NOTE — Telephone Encounter (Signed)
Spoke with patient.  Dr. Cyndie Chime wrote letter that patient requested.  Patient requested that we mail it to his home address.   Copy made to scan into EMR.  Original mailed to pt.

## 2012-07-23 ENCOUNTER — Encounter: Payer: Self-pay | Admitting: *Deleted

## 2012-07-30 ENCOUNTER — Other Ambulatory Visit (HOSPITAL_BASED_OUTPATIENT_CLINIC_OR_DEPARTMENT_OTHER): Payer: 59 | Admitting: Lab

## 2012-07-30 ENCOUNTER — Ambulatory Visit (HOSPITAL_COMMUNITY)
Admission: RE | Admit: 2012-07-30 | Discharge: 2012-07-30 | Disposition: A | Discharge status: Home / Self Care | Payer: 59 | Source: Ambulatory Visit | Attending: Nurse Practitioner | Admitting: Nurse Practitioner

## 2012-07-30 ENCOUNTER — Encounter (HOSPITAL_COMMUNITY): Payer: Self-pay

## 2012-07-30 DIAGNOSIS — C8589 Other specified types of non-Hodgkin lymphoma, extranodal and solid organ sites: Secondary | ICD-10-CM

## 2012-07-30 DIAGNOSIS — Z21 Asymptomatic human immunodeficiency virus [HIV] infection status: Secondary | ICD-10-CM | POA: Insufficient documentation

## 2012-07-30 DIAGNOSIS — K921 Melena: Secondary | ICD-10-CM | POA: Insufficient documentation

## 2012-07-30 DIAGNOSIS — Z87898 Personal history of other specified conditions: Secondary | ICD-10-CM | POA: Insufficient documentation

## 2012-07-30 LAB — CBC WITH DIFFERENTIAL/PLATELET
Basophils Absolute: 0 10*3/uL (ref 0.0–0.1)
EOS%: 3.2 % (ref 0.0–7.0)
Eosinophils Absolute: 0.2 10*3/uL (ref 0.0–0.5)
HCT: 43.5 % (ref 38.4–49.9)
HGB: 15.2 g/dL (ref 13.0–17.1)
MCH: 33.5 pg — ABNORMAL HIGH (ref 27.2–33.4)
MCV: 95.9 fL (ref 79.3–98.0)
NEUT%: 62.9 % (ref 39.0–75.0)
lymph#: 1.3 10*3/uL (ref 0.9–3.3)

## 2012-07-30 LAB — COMPREHENSIVE METABOLIC PANEL (CC13)
ALT: 43 U/L (ref 0–55)
Albumin: 3.8 g/dL (ref 3.5–5.0)
Alkaline Phosphatase: 113 U/L (ref 40–150)
CO2: 28 mEq/L (ref 22–29)
Potassium: 4.3 mEq/L (ref 3.5–5.1)
Sodium: 142 mEq/L (ref 136–145)
Total Bilirubin: 0.74 mg/dL (ref 0.20–1.20)
Total Protein: 7.3 g/dL (ref 6.4–8.3)

## 2012-07-30 LAB — LACTATE DEHYDROGENASE (CC13): LDH: 153 U/L (ref 125–245)

## 2012-07-30 MED ORDER — IOHEXOL 300 MG/ML  SOLN
100.0000 mL | Freq: Once | INTRAMUSCULAR | Status: AC | PRN
  Administered 2012-07-30: 100 mL via INTRAVENOUS

## 2012-07-31 ENCOUNTER — Encounter: Payer: Self-pay | Admitting: Oncology

## 2012-07-31 ENCOUNTER — Ambulatory Visit (HOSPITAL_BASED_OUTPATIENT_CLINIC_OR_DEPARTMENT_OTHER): Payer: 59 | Admitting: Oncology

## 2012-07-31 ENCOUNTER — Telehealth: Payer: Self-pay | Admitting: Oncology

## 2012-07-31 VITALS — BP 115/73 | HR 55 | Temp 97.2°F | Resp 18 | Ht 69.0 in | Wt 185.5 lb

## 2012-07-31 DIAGNOSIS — Z85048 Personal history of other malignant neoplasm of rectum, rectosigmoid junction, and anus: Secondary | ICD-10-CM

## 2012-07-31 DIAGNOSIS — C8589 Other specified types of non-Hodgkin lymphoma, extranodal and solid organ sites: Secondary | ICD-10-CM

## 2012-07-31 DIAGNOSIS — B20 Human immunodeficiency virus [HIV] disease: Secondary | ICD-10-CM

## 2012-07-31 NOTE — Telephone Encounter (Signed)
appts made and printed...td 

## 2012-08-02 NOTE — Progress Notes (Signed)
Hematology and Oncology Follow Up Visit  [name] [id number] August 30, 1965 46 y.o. 08/02/2012 3:47 PM   Principle Diagnosis: Encounter Diagnoses  Name Primary?  [name] Yes  . HIV INFECTION      Interim History:   Followup visit for this 47 year old man diagnosed with stage IVb, high-grade, large cell, CD20 negative non-Hodgkin's Rodriguez in February 2012. He initially presented with a cough and right pulmonary infiltrate. Pulmonary evaluation and bronchoscopy with biopsies on 07/02/2010 with nondiagnostic findings. Pathology showed marked inflammation and atypical cells. A lymphoproliferative process was suspected. PET scan showed intense uptake in the right upper lung, right nasal and oropharynx and additional areas in the left upper lung, right mediastinum, right hilar and precarinal areas. There was a single hypermetabolic focus in the right lobe of the liver. CT-guided biopsy of the liver lesion 07/20/2010 showed a high-grade B cell non-Hodgkin's Rodriguez. The tissue was negative for CD20 and CD79a on the initial report but positive for CD45 and CD45 RB. Bone marrow biopsy was negative. Serum LDH and uric acid were normal. 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 Due to his HIV positive status and apparent oropharyngeal involvement with Rodriguez, a lumbar puncture was done on 07/29/2010 and was negative for Rodriguez. He completed 6 cycles of CHOP chemotherapy 07/30/2010 through 11/12/2010 followed by a course of prophylactic intrathecal methotrexate.  Restaging CT scans of the chest and abdomen on 07/12/2011 showed no evidence of lymphomatous involvement in the chest or abdomen. Small retroperitoneal nodes measuring up to 7 mm were again noted.  Repeat scans on 11/07/2011  remained stable with no evidence for new or progressive disease. There has been complete resolution of all pulmonary pathology in the right lung.  He was diagnosed with noninvasive  squamous cell carcinoma in situ of the anus and underwent wide excision 06/24/2011.   He had routine followup CT scanning done on 05/09/2012. Radiologist did not know the details of his previous Rodriguez treatment or the fact that he has had a previous wide excision for squamous cell carcinoma in situ of the anus in June 2013. The December CT scan showed a small left external iliac lymph node 1.2 x 2.1 cm and a left obturator node 8 x 14 mm right is more prominent than on a study done in 2012 and consistent with progressive Rodriguez. Of note he never had any Rodriguez involvement below the belt. I did not feel that these nodes were pathologically enlarged or  related to  recurrent Rodriguez. I elected to get a short interval followup study done 07/30/2012 and I have reviewed these images personally as well as the previous images. The left external iliac node now measures 1.2  previously  1.2 x 2.1). Other subcentimeter nodes are unchanged. There is no new adenopathy and no suspicion for recurrent Rodriguez.  [name] recently up to [place] and married his long-term partner.  He has had no interim medical problems. No fever or infection. He is on a stable dose of his anti-retroviral medication.   Medications: reviewed  Allergies:  Allergies  Allergen Reactions  . Sulfonamide Derivatives     REACTION: rash, swelling, trouble breathing    Review of Systems: Constitutional:   No constitutional symptoms Respiratory: No cough or dyspnea Cardiovascular:  No chest pain or palpitations Gastrointestinal: No abdominal pain or change in bowel habit Genito-Urinary: No urinary tract symptoms Musculoskeletal: No muscle or bone pain Neurologic: Mild paresthesias in his feet residual from previous chemotherapy Skin: No  rash or ecchymosis Remaining ROS negative.  Physical Exam: Blood pressure 115/73, pulse 55, temperature 97.2 F (36.2 C), temperature source Oral, resp. rate 18, height 5\' 9"   (1.753 m), weight 185 lb 8 oz (84.142 kg). Wt Readings from Last 3 Encounters:  07/31/12 185 lb 8 oz (84.142 kg)  06/08/12 188 lb 12.8 oz (85.639 kg)  05/16/12 189 lb 4.8 oz (85.866 kg)     General appearance: He now appears healthy and well-nourished HENNT: Pharynx no erythema or exudate Lymph nodes: No cervical, supraclavicular, axillary, or inguinal lymphadenopathy Breasts: Lungs: Clear to auscultation resonant to percussion Heart: Regular rhythm no murmur Abdomen: Soft, nontender, no mass, no organomegaly Extremities: No edema, no calf tenderness Vascular: No cyanosis Neurologic: No focal deficit. Optic disc sharp Skin: No rash or ecchymosis  Lab Results: Lab Results  Component Value Date   WBC 5.0 07/30/2012   HGB 15.2 07/30/2012   HCT 43.5 07/30/2012   MCV 95.9 07/30/2012   PLT 122* 07/30/2012     Chemistry      Component Value Date/Time   NA 142 07/30/2012 0806   NA 139 02/07/2012 1425   NA 145 11/07/2011 0830   K 4.3 07/30/2012 0806   K 5.0 02/07/2012 1425   K 4.8* 11/07/2011 0830   CL 106 07/30/2012 0806   CL 101 02/07/2012 1425   CL 102 11/07/2011 0830   CO2 28 07/30/2012 0806   CO2 29 02/07/2012 1425   CO2 30 11/07/2011 0830   BUN 13.4 07/30/2012 0806   BUN 17 02/07/2012 1425   BUN 13 11/07/2011 0830   CREATININE 1.1 07/30/2012 0806   CREATININE 0.99 02/07/2012 1425   CREATININE 1.09 07/19/2011 1324      Component Value Date/Time   CALCIUM 9.5 07/30/2012 0806   CALCIUM 9.9 02/07/2012 1425   CALCIUM 8.8 11/07/2011 0830   ALKPHOS 113 07/30/2012 0806   ALKPHOS 105 02/07/2012 1425   ALKPHOS 105* 11/07/2011 0830   AST 27 07/30/2012 0806   AST 24 02/07/2012 1425   AST 33 11/07/2011 0830   ALT 43 07/30/2012 0806   ALT 36 02/07/2012 1425   BILITOT 0.74 07/30/2012 0806   BILITOT 0.5 02/07/2012 1425   BILITOT 0.70 11/07/2011 0830       Radiological Studies: Ct Abdomen Pelvis W Contrast  07/30/2012  *RADIOLOGY REPORT*  Clinical Data: Follow up enlarged lymph nodes.  History of Rodriguez.  HIV positive.   Bloody stools.  CT ABDOMEN AND PELVIS WITH CONTRAST  Technique:  Multidetector CT imaging of the abdomen and pelvis was performed following the standard protocol during bolus administration of intravenous contrast.  Contrast: OMNIPAQUE IOHEXOL 300 MG/ML  SOLN  Comparison: 05/09/2012  Findings: No pericardial or pleural effusion identified.  The lung bases appear clear.  There is no focal liver abnormality.  The gallbladder appears within normal limits.  No biliary dilatation.  The pancreas is unremarkable.  The spleen measures 13 cm in length.  This is unchanged from previous exam.  No focal splenic lesion identified.  Normal appearance of the adrenal glands.  The kidneys are both unremarkable.  Urinary bladder is normal for the degree of distention.  Prostate gland and seminal vesicles appear within normal limits.  Normal caliber of the abdominal aorta.  No retroperitoneal adenopathy.  No enlarged mesenteric lymph nodes.  Left external iliac lymph node measures 7 mm, image 74.  This is compared with 8 mm previously.  Slightly more distally there is a left external iliac node  which measures 1.3 cm, image 79.  This is unchanged from previous exam.  No new or enlarging nodules or masses identified. The stomach is normal.  The small bowel loops are unremarkable. The appendix is visualized and appears within normal limits.  The colon is negative.  Review of the visualized bony structures is unremarkable.  No aggressive lytic or sclerotic bone lesions identified.  IMPRESSION:  1.  No acute findings identified within the abdomen or the pelvis. No specific features to suggest residual or recurrent Rodriguez.  2.   Stable borderline splenomegaly.   Original Report Authenticated By: Signa Kell, M.D.     Impression and Plan: #1. High-grade, B cell, stage IVB, non-Hodgkin's Rodriguez treated as outlined above. He remains free of any obvious recurrence now out 2 years from diagnosis. I told him we did not need to  get routine scans at this point. I would like to get an annual chest radiograph to look for a second malignancies related to the prior chemotherapy and in view of his underlying HIV disease.  #2. HIV/AIDS Well-controlled on current medication  #3. 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 surgery   CC:. Dr. Leodis Liverpool; Dr. Rene Paci Dr. Elsworth Soho, MD 2/27/20143:47 PM

## 2012-08-06 ENCOUNTER — Other Ambulatory Visit: Payer: 59

## 2012-08-06 ENCOUNTER — Other Ambulatory Visit: Payer: Self-pay | Admitting: Infectious Diseases

## 2012-08-06 DIAGNOSIS — B20 Human immunodeficiency virus [HIV] disease: Secondary | ICD-10-CM

## 2012-08-06 LAB — LIPID PANEL
HDL: 35 mg/dL — ABNORMAL LOW (ref >39)
LDL Cholesterol: 105 mg/dL — ABNORMAL HIGH (ref 0–99)
Triglycerides: 111 mg/dL (ref <150)
VLDL: 22 mg/dL (ref 0–40)

## 2012-08-06 LAB — COMPLETE METABOLIC PANEL WITH GFR
ALT: 25 U/L (ref 0–53)
AST: 20 U/L (ref 0–37)
CO2: 30 mEq/L (ref 19–32)
Chloride: 103 mEq/L (ref 96–112)
Creat: 1.02 mg/dL (ref 0.50–1.35)
Sodium: 140 mEq/L (ref 135–145)
Total Bilirubin: 0.6 mg/dL (ref 0.3–1.2)
Total Protein: 7.1 g/dL (ref 6.0–8.3)

## 2012-08-06 LAB — CBC WITH DIFFERENTIAL/PLATELET
Basophils Absolute: 0 10*3/uL (ref 0.0–0.1)
Basophils Relative: 1 % (ref 0–1)
Lymphocytes Relative: 19 % (ref 12–46)
MCHC: 35.3 g/dL (ref 30.0–36.0)
Neutro Abs: 3.6 10*3/uL (ref 1.7–7.7)
Neutrophils Relative %: 68 % (ref 43–77)
Platelets: 154 10*3/uL (ref 150–400)
RDW: 13.6 % (ref 11.5–15.5)
WBC: 5.2 10*3/uL (ref 4.0–10.5)

## 2012-08-07 LAB — T-HELPER CELL (CD4) - (RCID CLINIC ONLY)
CD4 % Helper T Cell: 19 % — ABNORMAL LOW (ref 33–55)
CD4 T Cell Abs: 190 uL — ABNORMAL LOW (ref 400–2700)

## 2012-08-07 LAB — HIV-1 RNA QUANT-NO REFLEX-BLD
HIV 1 RNA Quant: 43 copies/mL — ABNORMAL HIGH (ref <20)
HIV-1 RNA Quant, Log: 1.63 {Log} — ABNORMAL HIGH (ref <1.30)

## 2012-08-20 ENCOUNTER — Encounter: Payer: Self-pay | Admitting: Infectious Disease

## 2012-08-20 ENCOUNTER — Ambulatory Visit (INDEPENDENT_AMBULATORY_CARE_PROVIDER_SITE_OTHER): Payer: 59 | Admitting: Infectious Disease

## 2012-08-20 VITALS — BP 135/82 | HR 62 | Temp 98.3°F | Wt 182.0 lb

## 2012-08-20 DIAGNOSIS — L0292 Furuncle, unspecified: Secondary | ICD-10-CM

## 2012-08-20 DIAGNOSIS — K219 Gastro-esophageal reflux disease without esophagitis: Secondary | ICD-10-CM

## 2012-08-20 DIAGNOSIS — J029 Acute pharyngitis, unspecified: Secondary | ICD-10-CM

## 2012-08-20 DIAGNOSIS — C8589 Other specified types of non-Hodgkin lymphoma, extranodal and solid organ sites: Secondary | ICD-10-CM

## 2012-08-20 DIAGNOSIS — D099 Carcinoma in situ, unspecified: Secondary | ICD-10-CM

## 2012-08-20 DIAGNOSIS — J020 Streptococcal pharyngitis: Secondary | ICD-10-CM

## 2012-08-20 NOTE — Progress Notes (Signed)
Subjective:    Patient ID: [name], male    DOB: 1965-12-25, 47 y.o.   MRN: [id number]  Sore Throat  The current episode started 1 to 4 weeks ago. The problem has been unchanged. Neither side of throat is experiencing more pain than the other. There has been no fever. The pain is mild. Pertinent negatives include no abdominal pain, congestion, coughing, diarrhea, shortness of breath, stridor, trouble swallowing or vomiting. He has had no exposure to strep or mono. He has tried nothing for the symptoms.    [name] is a 47 y.o. male who is doing superbly well on his  antiviral regimen, isentress and truvada with undetectable viral load and cd4 of 190. The percent stage was however relatively unchanged.   We again discussed other possible options for ARV for him  Including once daily TIVICAY with TRUVADA vs once daily COMPLERA (with food and no antacids) vs STRIBILD--though latter would be more problematic if he needed chemo again. He wishes to continue on current regimen.  He complains of a sore throat that he has had for the past month or so has occasionally been at come to back off in the morning. I speculated this could be related to gastroesophageal reflux disease in the proton pump inhibitor might potentially alleviate the symptoms.  He also has an area on his back but apparently been raised and drained purulent material. On exam today there is an area of where he still is healing on lower back.   We wrote letter for him to help him assist his partner in staying in the [place] with him. I also signed letter to give approval to massage in the cancer center.   I spent greater than 45 minutes with the patient including greater than 50% of time in face to face counsel of the patient and in coordination of their care.   I spent greater than 45 minutes with the patient including greater than 50% of time in face to face counsel of the patient and in coordination of their  care.    Review of Systems  Constitutional: Negative for fever, chills, diaphoresis, activity change, appetite change, fatigue and unexpected weight change.  HENT: Negative for congestion, sore throat, rhinorrhea, sneezing, trouble swallowing and sinus pressure.   Eyes: Negative for photophobia and visual disturbance.  Respiratory: Negative for cough, chest tightness, shortness of breath, wheezing and stridor.   Cardiovascular: Negative for chest pain, palpitations and leg swelling.  Gastrointestinal: Negative for nausea, vomiting, abdominal pain, diarrhea, constipation, blood in stool, abdominal distention and anal bleeding.  Genitourinary: Negative for dysuria, hematuria, flank pain and difficulty urinating.  Musculoskeletal: Negative for myalgias, back pain, joint swelling, arthralgias and gait problem.  Skin: Positive for rash. Negative for color change, pallor and wound.  Neurological: Negative for dizziness, tremors, weakness and light-headedness.  Hematological: Negative for adenopathy. Does not bruise/bleed easily.  Psychiatric/Behavioral: Negative for behavioral problems, confusion, sleep disturbance, dysphoric mood, decreased concentration and agitation.       Objective:   Physical Exam  Constitutional: He is oriented to person, place, and time. He appears well-developed and well-nourished. No distress.  HENT:  Head: Normocephalic and atraumatic.  Mouth/Throat: Posterior oropharyngeal erythema present. No oropharyngeal exudate, posterior oropharyngeal edema or tonsillar abscesses.  Eyes: Conjunctivae and EOM are normal. Pupils are equal, round, and reactive to light. No scleral icterus.  Neck: Normal range of motion. Neck supple. No JVD present.  Cardiovascular: Normal rate, regular rhythm and normal heart sounds.  Exam  reveals no gallop and no friction rub.   No murmur heard. Pulmonary/Chest: Effort normal and breath sounds normal. No respiratory distress. He has no wheezes.  He has no rales. He exhibits no tenderness.  Abdominal: He exhibits no distension and no mass. There is no tenderness. There is no rebound and no guarding.  Musculoskeletal: He exhibits no edema and no tenderness.       Cervical back: He exhibits no tenderness, no swelling, no edema, no deformity, no laceration and no pain.  Lymphadenopathy:    He has no cervical adenopathy.  Neurological: He is alert and oriented to person, place, and time. He has normal reflexes. He exhibits normal muscle tone. Coordination normal.  Skin: Skin is warm and dry. He is not diaphoretic. No erythema. No pallor.     Psychiatric: He has a normal mood and affect. His behavior is normal. Judgment and thought content normal.          Assessment & Plan:  HIV: continue isentress and truvada  Furuncle: topical agents fine if becomes raised may need abx + I and D if warm compresses and abx fail  Sore throat w cough: ? gerdcan try an otc prilosec  High-grade, B cell, stage IVB, non-Hodgkin's lymphoma: sp chemo and disease free x 2 years, followed closely by Dr. 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 surgery

## 2012-08-31 ENCOUNTER — Ambulatory Visit: Payer: 59 | Admitting: Internal Medicine

## 2012-10-04 DIAGNOSIS — S92911A Unspecified fracture of right toe(s), initial encounter for closed fracture: Secondary | ICD-10-CM

## 2012-10-04 HISTORY — DX: Unspecified fracture of right toe(s), initial encounter for closed fracture: S92.911A

## 2012-12-06 ENCOUNTER — Ambulatory Visit: Payer: 59 | Admitting: Internal Medicine

## 2012-12-14 ENCOUNTER — Ambulatory Visit (INDEPENDENT_AMBULATORY_CARE_PROVIDER_SITE_OTHER): Payer: 59 | Admitting: Internal Medicine

## 2012-12-14 ENCOUNTER — Encounter: Payer: Self-pay | Admitting: Internal Medicine

## 2012-12-14 VITALS — BP 112/80 | HR 63 | Temp 97.9°F | Ht 69.0 in | Wt 181.4 lb

## 2012-12-14 DIAGNOSIS — F411 Generalized anxiety disorder: Secondary | ICD-10-CM

## 2012-12-14 DIAGNOSIS — K602 Anal fissure, unspecified: Secondary | ICD-10-CM

## 2012-12-14 DIAGNOSIS — F419 Anxiety disorder, unspecified: Secondary | ICD-10-CM

## 2012-12-14 DIAGNOSIS — B20 Human immunodeficiency virus [HIV] disease: Secondary | ICD-10-CM

## 2012-12-14 MED ORDER — ALPRAZOLAM 0.25 MG PO TABS: 0.2500 mg | ORAL_TABLET | Freq: Two times a day (BID) | ORAL | Status: DC | PRN

## 2012-12-14 NOTE — Assessment & Plan Note (Signed)
  Recurrent rectal bleeding with anal pain - currently in "no problems" phase  history of anal fissure per review of prior colonoscopy report Also history of squamous cell carcinoma of anus, status post wide excision January 2013 at [facility] Perianal rash clinically concerning for candidiasis -prior biopsy and culture at derm (tafeen) consistent with group B strep, no HSV per patient report>>  stopped Valtrex  S/p treatment 04/2012 topical nitroglycerin > improved - use as needed for "flare"  Encouraged to conitnue antifungal therapy with Monistat over-the-counter cream or nystatin powder  followup with gastroenterology at [facility] as needed, last office visit September 2012, note in EMR Last followup with colorectal surgeon at [facility]) July 2013- note in EMR

## 2012-12-14 NOTE — Assessment & Plan Note (Signed)
Interval history reviewed Follows with infectious disease for same, currently well-controlled Continue antiviral therapy as prescribed 

## 2012-12-14 NOTE — Assessment & Plan Note (Signed)
Eventually wishes to lessen "dependancy" on BZs -  Reduced BZ dose 06/2012 - continue prn use and started amitriptyline qhs 06/2012 Doing generally well, no flares Continue SSRI and current meds- refills done

## 2012-12-14 NOTE — Patient Instructions (Signed)
It was good to see you today. We have reviewed your prior records including labs and tests today Medications reviewed and updated, no changes recommended - refills done Please schedule followup in 12 months for annual wellness visit and labs/med review, call sooner if problems.

## 2012-12-14 NOTE — Progress Notes (Signed)
  Subjective:    Patient ID: [name], male    DOB: 1966-03-12, 47 y.o.   MRN: [id number]  Anxiety Presents for follow-up visit. Patient reports no chest pain, depressed mood, nervous/anxious behavior, palpitations or suicidal ideas. Symptoms occur occasionally. The severity of symptoms is mild. The quality of sleep is good.   Compliance with medications is 76-100%.   Also reviewed chronic medical issues:  chronic rectal bleeding -Hx same associated with hemorrhoid and anal fissure has improved with topical intraanal NTG qd- not using IA Dilt  Prior evaluations in 2013 have included colonoscopy, sigmoidoscopy, wide excision resection of anal cancer and dermatology treatment of perianal rash Ineffective treatments have included penicillin, FQs, antifungal, increase in antiviral, A&D, barrier cream and topical steroids  Past Medical History  Diagnosis Date  . HIV positive   . ALLERGIC RHINITIS   . Anal fissure     Chronic, recurrent anal bleeding with pain  . Depression   . HEADACHE, CHRONIC   . Hemangioma   . Hemorrhoid   . HIV INFECTION 07/05/2010 dx  . Intertrigo   . Molluscum contagiosum   . NEUTROPENIA, DRUG-INDUCED   . 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 Thrombocytopenia    Review of Systems  Constitutional: Negative for fever, fatigue and unexpected weight change.  Cardiovascular: Negative for chest pain and palpitations.  Gastrointestinal: Negative for abdominal pain, diarrhea, constipation, blood in stool, abdominal distention and anal bleeding (not at present).  Psychiatric/Behavioral: Negative for suicidal ideas, behavioral problems, sleep disturbance, self-injury and dysphoric mood. The patient is not nervous/anxious.        Objective:   Physical Exam  BP  112/80  Pulse 63  Temp(Src) 97.9 F (36.6 C) (Oral)  Ht 5\' 9"  (1.753 m)  Wt 181 lb 6 oz (82.271 kg)  BMI 26.77 kg/m2  SpO2 97% Wt Readings from Last 3 Encounters:  12/14/12 181 lb 6 oz (82.271 kg)  08/20/12 182 lb (82.555 kg)  07/31/12 185 lb 8 oz (84.142 kg)   Constitutional:  He appears well-developed and well-nourished. No distress.  Cardiovascular: Normal rate, regular rhythm and normal heart sounds.  No murmur heard. no BLE edema Pulmonary/Chest: Effort normal and breath sounds normal. No respiratory distress. no wheezes.  Rectal: defer today  Psychiatric: he has a min anxious mood and affect. behavior is normal. Judgment and thought content normal.   Lab Results  Component Value Date   WBC 5.2 08/06/2012   HGB 14.9 08/06/2012   HCT 42.2 08/06/2012   PLT 154 08/06/2012   GLUCOSE 85 08/06/2012   CHOL 162 08/06/2012   TRIG 111 08/06/2012   HDL 35* 08/06/2012   LDLCALC 105* 08/06/2012   ALT 25 08/06/2012   AST 20 08/06/2012   NA 140 08/06/2012   K 4.6 08/06/2012   CL 103 08/06/2012   CREATININE 1.02 08/06/2012   BUN 12 08/06/2012   CO2 30 08/06/2012   TSH 0.572 08/09/2010   INR 0.96 03/21/2011       Assessment & Plan:   See problem list. Medications and labs reviewed today.  Time spent with pt today 25 minutes, greater than 50% time spent counseling patient on anxiety, anal issues and HIV + medication review. Also review of prior records

## 2012-12-19 ENCOUNTER — Other Ambulatory Visit: Payer: Self-pay | Admitting: *Deleted

## 2012-12-19 DIAGNOSIS — B20 Human immunodeficiency virus [HIV] disease: Secondary | ICD-10-CM

## 2012-12-19 MED ORDER — EMTRICITABINE-TENOFOVIR DF 200-300 MG PO TABS: 1.0000 | ORAL_TABLET | Freq: Every day | ORAL | Status: DC

## 2012-12-19 MED ORDER — RALTEGRAVIR POTASSIUM 400 MG PO TABS: 400.0000 mg | ORAL_TABLET | Freq: Two times a day (BID) | ORAL | Status: DC

## 2012-12-21 ENCOUNTER — Other Ambulatory Visit: Payer: Self-pay | Admitting: Licensed Clinical Social Worker

## 2012-12-21 DIAGNOSIS — B20 Human immunodeficiency virus [HIV] disease: Secondary | ICD-10-CM

## 2012-12-21 MED ORDER — EMTRICITABINE-TENOFOVIR DF 200-300 MG PO TABS: 1.0000 | ORAL_TABLET | Freq: Every day | ORAL | Status: DC

## 2012-12-21 MED ORDER — RALTEGRAVIR POTASSIUM 400 MG PO TABS: 400.0000 mg | ORAL_TABLET | Freq: Two times a day (BID) | ORAL | Status: DC

## 2012-12-24 ENCOUNTER — Telehealth: Payer: Self-pay

## 2012-12-24 DIAGNOSIS — B20 Human immunodeficiency virus [HIV] disease: Secondary | ICD-10-CM

## 2012-12-24 MED ORDER — EMTRICITABINE-TENOFOVIR DF 200-300 MG PO TABS: 1.0000 | ORAL_TABLET | Freq: Every day | ORAL | Status: DC

## 2012-12-24 NOTE — Telephone Encounter (Signed)
Pt did not receive his Truvada refill with the [facility].   I will phone this into pharmacy.   Pt informed medication called.    Laurell Josephs, RN

## 2013-02-05 ENCOUNTER — Other Ambulatory Visit (INDEPENDENT_AMBULATORY_CARE_PROVIDER_SITE_OTHER): Payer: 59

## 2013-02-05 DIAGNOSIS — Z113 Encounter for screening for infections with a predominantly sexual mode of transmission: Secondary | ICD-10-CM

## 2013-02-05 DIAGNOSIS — B20 Human immunodeficiency virus [HIV] disease: Secondary | ICD-10-CM

## 2013-02-05 LAB — COMPLETE METABOLIC PANEL WITH GFR
ALT: 25 U/L (ref 0–53)
AST: 21 U/L (ref 0–37)
Alkaline Phosphatase: 78 U/L (ref 39–117)
BUN: 14 mg/dL (ref 6–23)
Chloride: 104 mEq/L (ref 96–112)
Creat: 1.01 mg/dL (ref 0.50–1.35)
Total Bilirubin: 0.6 mg/dL (ref 0.3–1.2)

## 2013-02-05 LAB — CBC WITH DIFFERENTIAL/PLATELET
Basophils Absolute: 0 10*3/uL (ref 0.0–0.1)
Basophils Relative: 1 % (ref 0–1)
Eosinophils Relative: 4 % (ref 0–5)
HCT: 41.1 % (ref 39.0–52.0)
Lymphocytes Relative: 25 % (ref 12–46)
MCHC: 35.3 g/dL (ref 30.0–36.0)
MCV: 94.9 fL (ref 78.0–100.0)
Monocytes Absolute: 0.3 10*3/uL (ref 0.1–1.0)
Platelets: 137 10*3/uL — ABNORMAL LOW (ref 150–400)
RDW: 14.2 % (ref 11.5–15.5)
WBC: 4.7 10*3/uL (ref 4.0–10.5)

## 2013-02-05 LAB — RPR

## 2013-02-06 LAB — T-HELPER CELL (CD4) - (RCID CLINIC ONLY)
CD4 % Helper T Cell: 17 % — ABNORMAL LOW (ref 33–55)
CD4 T Cell Abs: 230 /uL — ABNORMAL LOW (ref 400–2700)

## 2013-02-07 LAB — HIV-1 RNA QUANT-NO REFLEX-BLD
HIV 1 RNA Quant: <20 copies/mL (ref <20)
HIV-1 RNA Quant, Log: <1.3 {Log} (ref <1.30)

## 2013-02-25 ENCOUNTER — Ambulatory Visit: Payer: 59 | Admitting: Infectious Disease

## 2013-03-04 ENCOUNTER — Encounter: Payer: Self-pay | Admitting: Infectious Disease

## 2013-03-04 ENCOUNTER — Ambulatory Visit (INDEPENDENT_AMBULATORY_CARE_PROVIDER_SITE_OTHER): Payer: 59 | Admitting: Infectious Disease

## 2013-03-04 VITALS — BP 117/78 | HR 60 | Temp 98.2°F | Ht 69.0 in | Wt 186.2 lb

## 2013-03-04 DIAGNOSIS — C8590 Non-Hodgkin lymphoma, unspecified, unspecified site: Secondary | ICD-10-CM

## 2013-03-04 DIAGNOSIS — Z23 Encounter for immunization: Secondary | ICD-10-CM

## 2013-03-04 DIAGNOSIS — C8589 Other specified types of non-Hodgkin lymphoma, extranodal and solid organ sites: Secondary | ICD-10-CM

## 2013-03-04 DIAGNOSIS — C801 Malignant (primary) neoplasm, unspecified: Secondary | ICD-10-CM

## 2013-03-04 DIAGNOSIS — B20 Human immunodeficiency virus [HIV] disease: Secondary | ICD-10-CM

## 2013-03-04 DIAGNOSIS — IMO0002 Reserved for concepts with insufficient information to code with codable children: Secondary | ICD-10-CM

## 2013-03-04 NOTE — Progress Notes (Signed)
  Subjective:    Patient ID: [name], male    DOB: Mar 26, 1966, 47 y.o.   MRN: [id number]  HPI  [name] is a 47 y.o. male who is doing superbly well on his  antiviral regimen, isentress and truvada with undetectable viral load and cd4 of 190. The percent stage was however relatively unchanged.   We again discussed other possible options for ARV for him  Including once daily TRIUMEQ, once daily COMPLERA (with food and no antacids) vs STRIBILD--though latter would be more problematic if he needed chemo again. He wishes to continue on current regimen.  area of where he still is healing on lower back.    Review of Systems  Constitutional: Negative for fever, chills, diaphoresis, activity change, appetite change, fatigue and unexpected weight change.  HENT: Negative for sore throat, rhinorrhea, sneezing and sinus pressure.   Eyes: Negative for photophobia and visual disturbance.  Respiratory: Negative for chest tightness and wheezing.   Cardiovascular: Negative for chest pain, palpitations and leg swelling.  Gastrointestinal: Negative for nausea, constipation, blood in stool, abdominal distention and anal bleeding.  Genitourinary: Negative for dysuria, hematuria, flank pain and difficulty urinating.  Musculoskeletal: Negative for myalgias, back pain, joint swelling, arthralgias and gait problem.  Skin: Positive for rash. Negative for color change, pallor and wound.  Neurological: Negative for dizziness, tremors, weakness and light-headedness.  Hematological: Negative for adenopathy. Does not bruise/bleed easily.  Psychiatric/Behavioral: Negative for behavioral problems, confusion, sleep disturbance, dysphoric mood, decreased concentration and agitation.       Objective:   Physical Exam  Constitutional: He is oriented to person, place, and time. He appears well-developed and well-nourished. No distress.  HENT:  Head: Normocephalic and atraumatic.  Mouth/Throat: No  oropharyngeal exudate, posterior oropharyngeal edema, posterior oropharyngeal erythema or tonsillar abscesses.  Eyes: Conjunctivae and EOM are normal. Pupils are equal, round, and reactive to light. No scleral icterus.  Neck: Normal range of motion. Neck supple. No JVD present.  Cardiovascular: Normal rate, regular rhythm and normal heart sounds.  Exam reveals no gallop and no friction rub.   No murmur heard. Pulmonary/Chest: Effort normal and breath sounds normal. No respiratory distress. He has no wheezes. He has no rales. He exhibits no tenderness.  Abdominal: He exhibits no distension and no mass. There is no tenderness. There is no rebound and no guarding.  Musculoskeletal: He exhibits no edema and no tenderness.       Cervical back: He exhibits no tenderness, no swelling, no edema, no deformity, no laceration and no pain.  Lymphadenopathy:    He has no cervical adenopathy.  Neurological: He is alert and oriented to person, place, and time. He has normal reflexes. He exhibits normal muscle tone. Coordination normal.  Skin: Skin is warm and dry. He is not diaphoretic. No erythema. No pallor.  Psychiatric: He has a normal mood and affect. His behavior is normal. Judgment and thought content normal.          Assessment & Plan:  HIV: check HLA [id number] if NEGATIVE change to TRIUMEQ if + then change to STRIBILD   High-grade, B cell, stage IVB, non-Hodgkin's lymphoma: sp chemo and disease free >2 years, followedby Dr. 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 surgery

## 2013-03-11 ENCOUNTER — Other Ambulatory Visit: Payer: Self-pay | Admitting: Internal Medicine

## 2013-03-12 LAB — HLA B*5701: HLA-B*5701: NEGATIVE

## 2013-03-13 ENCOUNTER — Telehealth: Payer: Self-pay | Admitting: Infectious Disease

## 2013-03-13 ENCOUNTER — Other Ambulatory Visit: Payer: Self-pay | Admitting: Licensed Clinical Social Worker

## 2013-03-13 DIAGNOSIS — B20 Human immunodeficiency virus [HIV] disease: Secondary | ICD-10-CM

## 2013-03-13 MED ORDER — ABACAVIR-DOLUTEGRAVIR-LAMIVUD 600-50-300 MG PO TABS: 1.0000 | ORAL_TABLET | Freq: Every day | ORAL | Status: DC

## 2013-03-13 NOTE — Telephone Encounter (Signed)
[name] is HLA [id number] negative and therefore safe to change to TRIUMEQ  Can we make sure this has gone thru to his pharmacy and is going to be mailed to him and to dc isentress and truvada

## 2013-03-15 ENCOUNTER — Telehealth: Payer: Self-pay | Admitting: Infectious Disease

## 2013-03-15 NOTE — Telephone Encounter (Signed)
I just was double checking I had send in his Triumeq

## 2013-04-03 ENCOUNTER — Other Ambulatory Visit (INDEPENDENT_AMBULATORY_CARE_PROVIDER_SITE_OTHER): Payer: 59

## 2013-04-03 DIAGNOSIS — B20 Human immunodeficiency virus [HIV] disease: Secondary | ICD-10-CM

## 2013-04-03 LAB — CBC WITH DIFFERENTIAL/PLATELET
Eosinophils Absolute: 0.2 10*3/uL (ref 0.0–0.7)
Eosinophils Relative: 4 % (ref 0–5)
HCT: 43.7 % (ref 39.0–52.0)
Lymphocytes Relative: 28 % (ref 12–46)
Lymphs Abs: 1.3 10*3/uL (ref 0.7–4.0)
MCH: 33 pg (ref 26.0–34.0)
MCV: 93.8 fL (ref 78.0–100.0)
Monocytes Absolute: 0.2 10*3/uL (ref 0.1–1.0)
Neutrophils Relative %: 62 % (ref 43–77)
Platelets: 156 10*3/uL (ref 150–400)
RBC: 4.66 MIL/uL (ref 4.22–5.81)
RDW: 13.4 % (ref 11.5–15.5)
WBC: 4.7 10*3/uL (ref 4.0–10.5)

## 2013-04-03 LAB — COMPLETE METABOLIC PANEL WITH GFR
ALT: 29 U/L (ref 0–53)
AST: 22 U/L (ref 0–37)
Albumin: 4.6 g/dL (ref 3.5–5.2)
Alkaline Phosphatase: 85 U/L (ref 39–117)
BUN: 16 mg/dL (ref 6–23)
Calcium: 9.8 mg/dL (ref 8.4–10.5)
Chloride: 106 mEq/L (ref 96–112)
Glucose, Bld: 115 mg/dL — ABNORMAL HIGH (ref 70–99)
Potassium: 4.4 mEq/L (ref 3.5–5.3)

## 2013-04-04 LAB — T-HELPER CELL (CD4) - (RCID CLINIC ONLY)
CD4 % Helper T Cell: 17 % — ABNORMAL LOW (ref 33–55)
CD4 T Cell Abs: 200 /uL — ABNORMAL LOW (ref 400–2700)

## 2013-04-04 LAB — HIV-1 RNA QUANT-NO REFLEX-BLD: HIV 1 RNA Quant: <20 copies/mL (ref <20)

## 2013-04-17 ENCOUNTER — Ambulatory Visit: Payer: 59 | Admitting: Infectious Disease

## 2013-04-24 ENCOUNTER — Ambulatory Visit (INDEPENDENT_AMBULATORY_CARE_PROVIDER_SITE_OTHER): Payer: 59 | Admitting: Infectious Disease

## 2013-04-24 ENCOUNTER — Encounter: Payer: Self-pay | Admitting: Infectious Disease

## 2013-04-24 VITALS — BP 121/81 | HR 56 | Temp 98.4°F | Wt 181.2 lb

## 2013-04-24 DIAGNOSIS — F32A Depression, unspecified: Secondary | ICD-10-CM

## 2013-04-24 DIAGNOSIS — B20 Human immunodeficiency virus [HIV] disease: Secondary | ICD-10-CM

## 2013-04-24 DIAGNOSIS — C20 Malignant neoplasm of rectum: Secondary | ICD-10-CM

## 2013-04-24 DIAGNOSIS — C8589 Other specified types of non-Hodgkin lymphoma, extranodal and solid organ sites: Secondary | ICD-10-CM

## 2013-04-24 DIAGNOSIS — F329 Major depressive disorder, single episode, unspecified: Secondary | ICD-10-CM

## 2013-04-24 DIAGNOSIS — C859A Non-Hodgkin lymphoma, unspecified, in remission: Secondary | ICD-10-CM

## 2013-04-24 DIAGNOSIS — C8590 Non-Hodgkin lymphoma, unspecified, unspecified site: Secondary | ICD-10-CM

## 2013-04-24 NOTE — Progress Notes (Signed)
  Subjective:    Patient ID: [name], male    DOB: [birth year], 47 y.o.   MRN: [id number]  HPI   [name] is a 47 y.o. male who is  doing superbly well on his  antiviral regimen, currently changed to  once daily TRIUMEQ. His viral load is undetectable and his CD4 count is 200. He does have a slight rise in his creatinine from 1.0-1.2. There is slight bump in the creatinine could be do to an effect on creatinine secretion by DTG and likely DOES NOT reflect any change in GFR.   He is otherwise doing very well and without complaints.  Review of Systems  Constitutional: Negative for fever, chills, diaphoresis, activity change, appetite change, fatigue and unexpected weight change.  HENT: Negative for rhinorrhea, sinus pressure, sneezing and sore throat.   Eyes: Negative for photophobia and visual disturbance.  Respiratory: Negative for chest tightness and wheezing.   Cardiovascular: Negative for chest pain, palpitations and leg swelling.  Gastrointestinal: Negative for nausea, constipation, blood in stool, abdominal distention and anal bleeding.  Genitourinary: Negative for dysuria, hematuria, flank pain and difficulty urinating.  Musculoskeletal: Negative for arthralgias, back pain, gait problem, joint swelling and myalgias.  Skin: Positive for rash. Negative for color change, pallor and wound.  Neurological: Negative for dizziness, tremors, weakness and light-headedness.  Hematological: Negative for adenopathy. Does not bruise/bleed easily.  Psychiatric/Behavioral: Negative for behavioral problems, confusion, sleep disturbance, dysphoric mood, decreased concentration and agitation.       Objective:   Physical Exam  Constitutional: He is oriented to person, place, and time. He appears well-developed and well-nourished. No distress.  HENT:  Head: Normocephalic and atraumatic.  Mouth/Throat: No oropharyngeal exudate, posterior oropharyngeal edema, posterior oropharyngeal  erythema or tonsillar abscesses.  Eyes: Conjunctivae and EOM are normal. Pupils are equal, round, and reactive to light. No scleral icterus.  Neck: Normal range of motion. Neck supple. No JVD present.  Cardiovascular: Normal rate, regular rhythm and normal heart sounds.  Exam reveals no gallop and no friction rub.   No murmur heard. Pulmonary/Chest: Effort normal and breath sounds normal. No respiratory distress. He has no wheezes. He has no rales. He exhibits no tenderness.  Abdominal: He exhibits no distension and no mass. There is no tenderness. There is no rebound and no guarding.  Musculoskeletal: He exhibits no edema and no tenderness.       Cervical back: He exhibits no tenderness, no swelling, no edema, no deformity, no laceration and no pain.  Lymphadenopathy:    He has no cervical adenopathy.  Neurological: He is alert and oriented to person, place, and time. He has normal reflexes. He exhibits normal muscle tone. Coordination normal.  Skin: Skin is warm and dry. He is not diaphoretic. No erythema. No pallor.  Psychiatric: He has a normal mood and affect. His behavior is normal. Judgment and thought content normal.          Assessment & Plan:  HIV: continue TRIUMEQ , RTC in 4 6 months for new labs  High-grade, B cell, stage IVB, non-Hodgkin's lymphoma: sp chemo and disease free >2 years, followedby Dr. Cyndie Chime and has appt with Dr. 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 surgery  Depression continue Celexa.  Slight raise in creatinine again likely due to effect on creatinine secretion by DTG and without true effect on GFR

## 2013-06-07 ENCOUNTER — Other Ambulatory Visit: Payer: Self-pay

## 2013-06-07 MED ORDER — ALPRAZOLAM 0.25 MG PO TABS: 0.2500 mg | ORAL_TABLET | Freq: Two times a day (BID) | ORAL | Status: DC | PRN

## 2013-06-07 NOTE — Telephone Encounter (Signed)
Faxed hardcopy to [facility] 

## 2013-07-24 ENCOUNTER — Other Ambulatory Visit (HOSPITAL_BASED_OUTPATIENT_CLINIC_OR_DEPARTMENT_OTHER): Payer: 59

## 2013-07-24 DIAGNOSIS — B20 Human immunodeficiency virus [HIV] disease: Secondary | ICD-10-CM

## 2013-07-24 DIAGNOSIS — C8589 Other specified types of non-Hodgkin lymphoma, extranodal and solid organ sites: Secondary | ICD-10-CM

## 2013-07-24 LAB — CBC WITH DIFFERENTIAL/PLATELET
BASO%: 0.8 % (ref 0.0–2.0)
Basophils Absolute: 0 10*3/uL (ref 0.0–0.1)
EOS ABS: 0.2 10*3/uL (ref 0.0–0.5)
EOS%: 3.6 % (ref 0.0–7.0)
HEMATOCRIT: 45 % (ref 38.4–49.9)
HEMOGLOBIN: 15.6 g/dL (ref 13.0–17.1)
LYMPH#: 1.3 10*3/uL (ref 0.9–3.3)
LYMPH%: 27.9 % (ref 14.0–49.0)
MCH: 34.4 pg — ABNORMAL HIGH (ref 27.2–33.4)
MCHC: 34.5 g/dL (ref 32.0–36.0)
MCV: 99.6 fL — ABNORMAL HIGH (ref 79.3–98.0)
MONO#: 0.3 10*3/uL (ref 0.1–0.9)
MONO%: 6.5 % (ref 0.0–14.0)
NEUT%: 61.2 % (ref 39.0–75.0)
NEUTROS ABS: 2.9 10*3/uL (ref 1.5–6.5)
Platelets: 160 10*3/uL (ref 140–400)
RBC: 4.52 10*6/uL (ref 4.20–5.82)
RDW: 12.5 % (ref 11.0–14.6)
WBC: 4.7 10*3/uL (ref 4.0–10.3)

## 2013-07-24 LAB — COMPREHENSIVE METABOLIC PANEL (CC13)
ALBUMIN: 4.4 g/dL (ref 3.5–5.0)
ALT: 38 U/L (ref 0–55)
ANION GAP: 10 meq/L (ref 3–11)
AST: 49 U/L — ABNORMAL HIGH (ref 5–34)
Alkaline Phosphatase: 77 U/L (ref 40–150)
BILIRUBIN TOTAL: 0.46 mg/dL (ref 0.20–1.20)
BUN: 13.6 mg/dL (ref 7.0–26.0)
CHLORIDE: 110 meq/L — AB (ref 98–109)
CO2: 23 meq/L (ref 22–29)
Calcium: 9.8 mg/dL (ref 8.4–10.4)
Creatinine: 1.2 mg/dL (ref 0.7–1.3)
Glucose: 107 mg/dl (ref 70–140)
POTASSIUM: 4 meq/L (ref 3.5–5.1)
SODIUM: 143 meq/L (ref 136–145)
TOTAL PROTEIN: 7.3 g/dL (ref 6.4–8.3)

## 2013-07-24 LAB — SEDIMENTATION RATE: Sed Rate: 5 mm/hr (ref 0–16)

## 2013-07-24 LAB — LACTATE DEHYDROGENASE (CC13): LDH: 211 U/L (ref 125–245)

## 2013-07-30 ENCOUNTER — Ambulatory Visit (HOSPITAL_COMMUNITY)
Admission: RE | Admit: 2013-07-30 | Discharge: 2013-07-30 | Disposition: A | Discharge status: Home / Self Care | Payer: 59 | Source: Ambulatory Visit | Attending: Oncology | Admitting: Oncology

## 2013-07-30 ENCOUNTER — Ambulatory Visit (HOSPITAL_BASED_OUTPATIENT_CLINIC_OR_DEPARTMENT_OTHER): Payer: 59 | Admitting: Oncology

## 2013-07-30 VITALS — BP 136/91 | HR 63 | Temp 98.1°F | Resp 18 | Ht 69.0 in | Wt 185.2 lb

## 2013-07-30 DIAGNOSIS — Z85048 Personal history of other malignant neoplasm of rectum, rectosigmoid junction, and anus: Secondary | ICD-10-CM

## 2013-07-30 DIAGNOSIS — C8589 Other specified types of non-Hodgkin lymphoma, extranodal and solid organ sites: Secondary | ICD-10-CM

## 2013-07-30 DIAGNOSIS — Z21 Asymptomatic human immunodeficiency virus [HIV] infection status: Secondary | ICD-10-CM | POA: Insufficient documentation

## 2013-07-30 DIAGNOSIS — B20 Human immunodeficiency virus [HIV] disease: Secondary | ICD-10-CM

## 2013-07-30 NOTE — Progress Notes (Signed)
Hematology and Oncology Follow Up Visit  [name] [id number] 1966-02-12 48 y.o. 07/30/2013 1:33 PM   Principle Diagnosis: Encounter Diagnoses  Name Primary?  [name] LYMPHOMA Yes  . 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 He initially presented with a cough and right pulmonary infiltrate. Pulmonary evaluation and bronchoscopy with biopsies on 07/02/2010 with nondiagnostic findings. Pathology showed marked inflammation and atypical cells. A lymphoproliferative process was suspected. PET scan showed intense uptake in the right upper lung, right nasal and oropharynx and additional areas in the left upper lung, right mediastinum, right hilar and precarinal areas. There was a single hypermetabolic focus in the right lobe of the liver. CT-guided biopsy of the liver lesion 07/20/2010 showed a high-grade B cell non-Hodgkin's lymphoma. The tissue was negative for CD20 and CD79a on the initial report but positive for CD45 and CD45 . Bone marrow biopsy was negative. Serum LDH and uric acid were normal. 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 Due to his HIV positive status and apparent oropharyngeal involvement with lymphoma, a lumbar puncture was done on 07/29/2010 and was negative for lymphoma. He completed 6 cycles of CHOP chemotherapy 07/30/2010 through 11/12/2010 followed by a course of prophylactic intrathecal methotrexate.  Restaging CT scans of the chest and abdomen on 07/12/2011 showed no evidence of lymphomatous involvement in the chest or abdomen. Small retroperitoneal nodes measuring up to 7 mm were again noted.  Repeat scans on 11/07/2011 remained stable with no evidence for new or progressive disease. There has been complete resolution of all pulmonary pathology in the right lung.   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   He is doing well at this time. He looks incredibly healthy. HIV under good control. Recent change in his medications. No interim medical problems. No infections.    Medications: reviewed  Allergies:  Allergies  Allergen Reactions  . Sulfonamide Derivatives     REACTION: rash, swelling, trouble breathing    Review of Systems: Hematology:  No bleeding or bruising ENT ROS: No sore throat Breast ROS:  Respiratory ROS: No cough or dyspnea  Cardiovascular ROS:  No chest pain or palpitations Gastrointestinal ROS: No abdominal pain or change in bowel habit   Genito-Urinary ROS: Not questioned  Musculoskeletal ROS: No muscle bone or joint pain Neurological ROS: No headache or change in vision Dermatological ROS: No rash or ecchymosis Remaining ROS negative:   Physical Exam: Blood pressure 136/91, pulse 63, temperature 98.1 F (36.7 C), resp. rate 18, height _0  (1.753 m), weight 185 lb 3.2 oz (84.006 kg), SpO2 97.00%. Wt Readings from Last 3 Encounters:  07/30/13 185 lb 3.2 oz (84.006 kg)  04/24/13 181 lb 4 oz (82.214 kg)  03/04/13 186 lb 4 oz (84.482 kg)     General appearance: Well-nourished Caucasian man HENNT: Pharynx no erythema, exudate, mass, or ulcer. No thyromegaly or thyroid nodules Lymph nodes: No cervical, supraclavicular, or axillary lymphadenopathy Breasts:  Lungs: Clear to auscultation, resonant to percussion throughout Heart: Regular rhythm, no murmur, no gallop, no rub, no click, no edema Abdomen: Soft, nontender, normal bowel sounds, no mass, no organomegaly Extremities: No edema, no calf tenderness Musculoskeletal: no joint deformities GU:  Vascular: Carotid pulses 2+, no bruits, Neurologic: Alert, oriented, PERRLA,   cranial nerves grossly normal,  motor strength 5 over 5, reflexes 1+ symmetric, upper body coordination normal, gait normal, Skin: No rash or  ecchymosis  Lab Results: CBC W/Diff    Component Value Date/Time   WBC 4.7 07/24/2013 0801   WBC 4.7 04/03/2013 0849   RBC 4.52 07/24/2013 0801   RBC 4.66 04/03/2013 0849   HGB 15.6 07/24/2013 0801   HGB 15.4 04/03/2013 0849   HCT 45.0 07/24/2013 0801   HCT 43.7 04/03/2013 0849   PLT 160 07/24/2013 0801   PLT 156 04/03/2013 0849   MCV 99.6* 07/24/2013 0801   MCV 93.8 04/03/2013 0849   MCH 34.4* 07/24/2013 0801   MCH 33.0 04/03/2013 0849   MCHC 34.5 07/24/2013 0801   MCHC 35.2 04/03/2013 0849   RDW 12.5 07/24/2013 0801   RDW 13.4 04/03/2013 0849   LYMPHSABS 1.3 07/24/2013 0801   LYMPHSABS 1.3 04/03/2013 0849   MONOABS 0.3 07/24/2013 0801   MONOABS 0.2 04/03/2013 0849   EOSABS 0.2 07/24/2013 0801   EOSABS 0.2 04/03/2013 0849   BASOSABS 0.0 07/24/2013 0801   BASOSABS 0.0 04/03/2013 0849     Chemistry      Component Value Date/Time   NA 143 07/24/2013 0801   NA 141 04/03/2013 0849   NA 145 11/07/2011 0830   K 4.0 07/24/2013 0801   K 4.4 04/03/2013 0849   K 4.8* 11/07/2011 0830   CL 106 04/03/2013 0849   CL 106 07/30/2012 0806   CL 102 11/07/2011 0830   CO2 23 07/24/2013 0801   CO2 25 04/03/2013 0849   CO2 30 11/07/2011 0830   BUN 13.6 07/24/2013 0801   BUN 16 04/03/2013 0849   BUN 13 11/07/2011 0830   CREATININE 1.2 07/24/2013 0801   CREATININE 1.26 04/03/2013 0849   CREATININE 1.09 07/19/2011 1324      Component Value Date/Time   CALCIUM 9.8 07/24/2013 0801   CALCIUM 9.8 04/03/2013 0849   CALCIUM 8.8 11/07/2011 0830   ALKPHOS 77 07/24/2013 0801   ALKPHOS 85 04/03/2013 0849   ALKPHOS 105* 11/07/2011 0830   AST 49* 07/24/2013 0801   AST 22 04/03/2013 0849   AST 33 11/07/2011 0830   ALT 38 07/24/2013 0801   ALT 29 04/03/2013 0849   ALT 38 11/07/2011 0830   BILITOT 0.46 07/24/2013 0801   BILITOT 0.5 04/03/2013 0849   BILITOT 0.70 11/07/2011 0830       Radiological Studies: Dg Chest 2 View  07/30/2013   CLINICAL DATA:  History of lymphoma and HIV.  EXAM: CHEST  2 VIEW  COMPARISON:  May 06, 2011  FINDINGS: The heart size and mediastinal contours are within normal limits. Both lungs are clear. The visualized skeletal structures are unremarkable.  IMPRESSION: No active cardiopulmonary disease.   Electronically Signed   By: Abelardo Diesel M.D.   On: 07/30/2013 13:25    Impression:   #1. High-grade, B cell, stage IVB, non-Hodgkin's lymphoma treated as outlined above.  He remains free of any obvious recurrence now out 3 years from diagnosis.  He has a high chance of cure at this time. He has good primary care followup and infectious disease followup. I told him that he could graduate from our practice at this time. He is welcome to return if there are any changes in the future that we can assist him with.  #2. HIV/AIDS  Well-controlled on current medication  Mild elevation of SGOT enzyme likely related to his HIV meds. Remainder of liver functions including LDH normal.  #  3. Squamous cell carcinoma in situ of the anus status post wide excision   CC: Patient Care Team: Rowe Clack, MD as PCP - General (Internal Medicine) Truman Hayward, MD as PCP - Infectious Diseases (Infectious Diseases) Arta Silence, MD (Gastroenterology) Annia Belt, MD (Hematology and Oncology) Lavonna Monarch, MD (Dermatology) Newt Minion, MD (Orthopedic Surgery)   Annia Belt, MD 2/24/20151:33 PM

## 2013-07-31 ENCOUNTER — Telehealth: Payer: Self-pay | Admitting: *Deleted

## 2013-07-31 NOTE — Telephone Encounter (Signed)
Message copied by Norma Fredrickson on Wed Jul 31, 2013  4:45 PM ------      Message from: Ignacia Felling      Created: Wed Jul 31, 2013  4:35 PM                   ----- Message -----         From: Annia Belt, MD         Sent: 07/31/2013   8:23 AM           To: Ignacia Felling, RN, Jesse Fall, RN, #            Call pt: CXR normal ------

## 2013-07-31 NOTE — Telephone Encounter (Signed)
Called and informed patient CXR is normal.  Per Dr. Beryle Beams.  Patient verbalized understanding.

## 2013-09-09 ENCOUNTER — Other Ambulatory Visit: Payer: Self-pay | Admitting: Internal Medicine

## 2013-10-09 ENCOUNTER — Other Ambulatory Visit: Payer: 59

## 2013-10-09 DIAGNOSIS — B20 Human immunodeficiency virus [HIV] disease: Secondary | ICD-10-CM

## 2013-10-09 DIAGNOSIS — Z79899 Other long term (current) drug therapy: Secondary | ICD-10-CM

## 2013-10-09 DIAGNOSIS — Z113 Encounter for screening for infections with a predominantly sexual mode of transmission: Secondary | ICD-10-CM

## 2013-10-09 LAB — CBC WITH DIFFERENTIAL/PLATELET
Basophils Absolute: 0 10*3/uL (ref 0.0–0.1)
Basophils Relative: 1 % (ref 0–1)
EOS ABS: 0.2 10*3/uL (ref 0.0–0.7)
Eosinophils Relative: 4 % (ref 0–5)
HEMATOCRIT: 45.1 % (ref 39.0–52.0)
HEMOGLOBIN: 15.4 g/dL (ref 13.0–17.0)
Lymphocytes Relative: 26 % (ref 12–46)
Lymphs Abs: 1.2 10*3/uL (ref 0.7–4.0)
MCH: 33.3 pg (ref 26.0–34.0)
MCHC: 34.1 g/dL (ref 30.0–36.0)
MCV: 97.4 fL (ref 78.0–100.0)
MONO ABS: 0.3 10*3/uL (ref 0.1–1.0)
MONOS PCT: 7 % (ref 3–12)
Neutro Abs: 3 10*3/uL (ref 1.7–7.7)
Neutrophils Relative %: 62 % (ref 43–77)
PLATELETS: 178 10*3/uL (ref 150–400)
RBC: 4.63 MIL/uL (ref 4.22–5.81)
RDW: 13.7 % (ref 11.5–15.5)
WBC: 4.8 10*3/uL (ref 4.0–10.5)

## 2013-10-09 NOTE — Addendum Note (Signed)
Addended by: Dolan Amen D on: 10/09/2013 09:07 AM   Modules accepted: Orders

## 2013-10-10 LAB — LIPID PANEL
CHOLESTEROL: 198 mg/dL (ref 0–200)
HDL: 49 mg/dL (ref >39)
LDL Cholesterol: 130 mg/dL — ABNORMAL HIGH (ref 0–99)
Total CHOL/HDL Ratio: 4 Ratio
Triglycerides: 94 mg/dL (ref <150)
VLDL: 19 mg/dL (ref 0–40)

## 2013-10-10 LAB — COMPLETE METABOLIC PANEL WITH GFR
ALT: 30 U/L (ref 0–53)
AST: 27 U/L (ref 0–37)
Albumin: 4.4 g/dL (ref 3.5–5.2)
Alkaline Phosphatase: 78 U/L (ref 39–117)
BILIRUBIN TOTAL: 0.7 mg/dL (ref 0.2–1.2)
BUN: 13 mg/dL (ref 6–23)
CALCIUM: 9.7 mg/dL (ref 8.4–10.5)
CO2: 26 meq/L (ref 19–32)
CREATININE: 1.16 mg/dL (ref 0.50–1.35)
Chloride: 104 mEq/L (ref 96–112)
GFR, EST AFRICAN AMERICAN: 87 mL/min
GFR, EST NON AFRICAN AMERICAN: 75 mL/min
GLUCOSE: 96 mg/dL (ref 70–99)
Potassium: 4.4 mEq/L (ref 3.5–5.3)
Sodium: 141 mEq/L (ref 135–145)
Total Protein: 7 g/dL (ref 6.0–8.3)

## 2013-10-10 LAB — HIV-1 RNA QUANT-NO REFLEX-BLD
HIV 1 RNA Quant: <20 copies/mL (ref <20)
HIV-1 RNA Quant, Log: <1.3 {Log} (ref <1.30)

## 2013-10-10 LAB — URINE CYTOLOGY ANCILLARY ONLY
Chlamydia: NEGATIVE
Neisseria Gonorrhea: NEGATIVE

## 2013-10-10 LAB — HEPATITIS C ANTIBODY: HCV AB: NEGATIVE

## 2013-10-10 LAB — T-HELPER CELL (CD4) - (RCID CLINIC ONLY)
CD4 % Helper T Cell: 21 % — ABNORMAL LOW (ref 33–55)
CD4 T Cell Abs: 290 /uL — ABNORMAL LOW (ref 400–2700)

## 2013-10-10 LAB — HEPATITIS B SURFACE ANTIBODY,QUALITATIVE: Hep B S Ab: NEGATIVE

## 2013-10-10 LAB — MICROALBUMIN / CREATININE URINE RATIO
CREATININE, URINE: 273.9 mg/dL
MICROALB UR: 0.95 mg/dL (ref 0.00–1.89)
MICROALB/CREAT RATIO: 3.5 mg/g (ref 0.0–30.0)

## 2013-10-10 LAB — RPR

## 2013-10-21 ENCOUNTER — Ambulatory Visit: Payer: 59 | Admitting: Infectious Disease

## 2013-11-27 ENCOUNTER — Other Ambulatory Visit: Payer: Self-pay | Admitting: *Deleted

## 2013-11-27 MED ORDER — CITALOPRAM HYDROBROMIDE 40 MG PO TABS: ORAL_TABLET | ORAL | Status: DC

## 2013-11-28 ENCOUNTER — Other Ambulatory Visit: Payer: Self-pay | Admitting: Internal Medicine

## 2013-12-19 ENCOUNTER — Encounter: Payer: Self-pay | Admitting: Internal Medicine

## 2013-12-19 ENCOUNTER — Ambulatory Visit (INDEPENDENT_AMBULATORY_CARE_PROVIDER_SITE_OTHER): Payer: 59 | Admitting: Internal Medicine

## 2013-12-19 ENCOUNTER — Other Ambulatory Visit (INDEPENDENT_AMBULATORY_CARE_PROVIDER_SITE_OTHER): Payer: 59

## 2013-12-19 VITALS — BP 120/80 | HR 56 | Temp 97.7°F | Ht 69.0 in | Wt 188.4 lb

## 2013-12-19 DIAGNOSIS — F3289 Other specified depressive episodes: Secondary | ICD-10-CM

## 2013-12-19 DIAGNOSIS — C8589 Other specified types of non-Hodgkin lymphoma, extranodal and solid organ sites: Secondary | ICD-10-CM

## 2013-12-19 DIAGNOSIS — Z0000 Encounter for general adult medical examination without abnormal findings: Secondary | ICD-10-CM

## 2013-12-19 DIAGNOSIS — B20 Human immunodeficiency virus [HIV] disease: Secondary | ICD-10-CM

## 2013-12-19 DIAGNOSIS — F329 Major depressive disorder, single episode, unspecified: Secondary | ICD-10-CM

## 2013-12-19 DIAGNOSIS — F32A Depression, unspecified: Secondary | ICD-10-CM

## 2013-12-19 LAB — PSA: PSA: 1.37 ng/mL (ref 0.10–4.00)

## 2013-12-19 LAB — LIPID PANEL
CHOL/HDL RATIO: 5
CHOLESTEROL: 184 mg/dL (ref 0–200)
HDL: 40.8 mg/dL (ref >39.00)
LDL CALC: 117 mg/dL — AB (ref 0–99)
NonHDL: 143.2
TRIGLYCERIDES: 130 mg/dL (ref 0.0–149.0)
VLDL: 26 mg/dL (ref 0.0–40.0)

## 2013-12-19 LAB — TSH: TSH: 1.95 u[IU]/mL (ref 0.35–4.50)

## 2013-12-19 MED ORDER — CITALOPRAM HYDROBROMIDE 40 MG PO TABS: 40.0000 mg | ORAL_TABLET | Freq: Every day | ORAL | Status: DC

## 2013-12-19 MED ORDER — AMITRIPTYLINE HCL 10 MG PO TABS: 10.0000 mg | ORAL_TABLET | Freq: Every evening | ORAL | Status: DC | PRN

## 2013-12-19 NOTE — Progress Notes (Signed)
Pre visit review using our clinic review tool, if applicable. No additional management support is needed unless otherwise documented below in the visit note. 

## 2013-12-19 NOTE — Progress Notes (Signed)
Subjective:    Patient ID: [name], male    DOB: 12/04/1965, 48 y.o.   MRN: [id number]  HPI  patient is here today for annual physical. Patient feels well and has no complaints.  Also reviewed chronic medical issues and interval medical events  Past Medical History  Diagnosis Date  . ALLERGIC RHINITIS   . Anal fissure     Chronic, recurrent anal bleeding with pain  . Depression   . HEADACHE, CHRONIC   . Hemangioma   . Hemorrhoid   . HIV INFECTION 07/05/2010 dx  . Intertrigo   . Molluscum contagiosum   . NEUTROPENIA, DRUG-INDUCED     on chemo early 2012  . 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 Thrombocytopenia   . Toe fracture, right 10/04/2012    great toe, nonsurgical mgmt Sharol Given)   Family History  Problem Relation Age of Onset  . Alcohol abuse Mother   . Cancer Father 6    unknown primary  . Hypertension Mother   . Hyperlipidemia Brother   . Alcohol abuse Father   . Cirrhosis Father     alcohol   History  Substance Use Topics  . Smoking status: Former Research scientist (life sciences)  . 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 Alcohol Use: Yes     Comment: occasional, socially    Review of Systems  Constitutional: Negative for fever, activity change, appetite change, fatigue and unexpected weight change.  Respiratory: Negative for cough, chest tightness, shortness of breath and wheezing.   Cardiovascular: Negative for chest pain, palpitations and leg swelling.  Skin:       Intermittent flare of skin rash.problems near rectum, chronic since 2012 - generally improved since onset (less freq/less severe), currently "pretty good"  Neurological: Negative for dizziness, weakness and headaches.  Psychiatric/Behavioral: Negative for  dysphoric mood. The patient is not nervous/anxious.   All other systems reviewed and are negative.      Objective:   Physical Exam  BP 120/80  Pulse 56  Temp(Src) 97.7 F (36.5 C) (Oral)  Ht 5\' 9"  (1.753 m)  Wt 188 lb 6.4 oz (85.458 kg)  BMI 27.81 kg/m2  SpO2 97% Wt Readings from Last 3 Encounters:  12/19/13 188 lb 6.4 oz (85.458 kg)  07/30/13 185 lb 3.2 oz (84.006 kg)  04/24/13 181 lb 4 oz (82.214 kg)   Constitutional: he appears well-developed and well-nourished. No distress.  HENT: Head: Normocephalic and atraumatic. Ears: B TMs ok, no erythema or effusion; Nose: Nose normal. Mouth/Throat: Oropharynx is clear and moist. No oropharyngeal exudate.  Eyes: Conjunctivae and EOM are normal. Pupils are equal, round, and reactive to light. No scleral icterus.  Neck: Normal range of motion. Neck supple. No JVD present. No thyromegaly present.  Cardiovascular: Normal rate, regular rhythm and normal heart sounds.  No murmur heard. No BLE edema. Pulmonary/Chest: Effort normal and breath sounds normal. No respiratory distress. he has no wheezes.  Abdominal: Soft. Bowel sounds are normal. he exhibits no distension. There is no tenderness. no masses GU/rectal: normal rectal tone, prostate normal without nodule, tenderness or enlargement - Musculoskeletal: Normal range of motion, no joint effusions. No gross deformities Neurological: he is alert and oriented to person, place, and time. No cranial  nerve deficit. Coordination, balance, strength, speech and gait are normal.  Skin: Mild erythema at lower buttock fold above anus - no ulceration or ulceration -Skin is warm and dry. No rash noted. No erythema.  Psychiatric: he has a normal mood and affect. behavior is normal. Judgment and thought content normal.  Lab Results  Component Value Date   WBC 4.8 10/09/2013   HGB 15.4 10/09/2013   HCT 45.1 10/09/2013   PLT 178 10/09/2013   GLUCOSE 96 10/09/2013   CHOL 198 10/09/2013   TRIG 94 10/09/2013   HDL 49  10/09/2013   LDLCALC 130* 10/09/2013   ALT 30 10/09/2013   AST 27 10/09/2013   NA 141 10/09/2013   K 4.4 10/09/2013   CL 104 10/09/2013   CREATININE 1.16 10/09/2013   BUN 13 10/09/2013   CO2 26 10/09/2013   TSH 0.572 08/09/2010   INR 0.96 03/21/2011   MICROALBUR 0.95 10/09/2013    Dg Chest 2 View  07/30/2013   CLINICAL DATA:  History of lymphoma and HIV.  EXAM: CHEST  2 VIEW  COMPARISON:  May 06, 2011  FINDINGS: The heart size and mediastinal contours are within normal limits. Both lungs are clear. The visualized skeletal structures are unremarkable.  IMPRESSION: No active cardiopulmonary disease.   Electronically Signed   By: Abelardo Diesel M.D.   On: 07/30/2013 13:25       Assessment & Plan:   CPX/v70.0 - Patient has been counseled on age-appropriate routine health concerns for screening and prevention. These are reviewed and up-to-date. Immunizations are up-to-date or declined. Labs ordered and reviewed.  Problem List Items Addressed This Visit   Depression     Eventually wishes to lessen "dependancy" on BZs -  Reduced BZ dose 06/2012 - continue prn use and started amitriptyline qhs 06/2012 Feels ready to wean from elavil - ok to take prn Doing generally well, no anxiety or depression flares Continue SSRI and current meds- refills done     Relevant Medications      citalopram (CELEXA) tablet      amitriptyline (ELAVIL) tablet   HIV INFECTION     Interval history reviewed Follows with infectious disease for same, currently well-controlled Continue antiviral therapy as prescribed    NON-HODGKIN'S LYMPHOMA     Interval history reviewed in depth In remission Released by oncology 07/2013 - will follow here as needed     Other Visit Diagnoses   Routine general medical examination at a health care facility    -  Primary    Relevant Orders       TSH       Lipid panel       PSA

## 2013-12-19 NOTE — Assessment & Plan Note (Signed)
Eventually wishes to lessen "dependancy" on BZs -  Reduced BZ dose 06/2012 - continue prn use and started amitriptyline qhs 06/2012 Feels ready to wean from elavil - ok to take prn Doing generally well, no anxiety or depression flares Continue SSRI and current meds- refills done

## 2013-12-19 NOTE — Patient Instructions (Addendum)
It was good to see you today.  We have reviewed your prior records including labs and tests today  Health Maintenance reviewed - all recommended immunizations and age-appropriate screenings are up-to-date.  Test(s) ordered today. Your results will be released to MyChart (or called to you) after review, usually within 72hours after test completion. If any changes need to be made, you will be notified at that same time.  Medications reviewed and updated, no changes recommended at this time. Refill on medication(s) as discussed today.  Please schedule followup in 12 months for annual exam and labs, call sooner if problems.  Health Maintenance A healthy lifestyle and preventative care can promote health and wellness.  Maintain regular health, dental, and eye exams.  Eat a healthy diet. Foods like vegetables, fruits, whole grains, low-fat dairy products, and lean protein foods contain the nutrients you need and are low in calories. Decrease your intake of foods high in solid fats, added sugars, and salt. Get information about a proper diet from your health care provider, if necessary.  Regular physical exercise is one of the most important things you can do for your health. Most adults should get at least 150 minutes of moderate-intensity exercise (any activity that increases your heart rate and causes you to sweat) each week. In addition, most adults need muscle-strengthening exercises on 2 or more days a week.   Maintain a healthy weight. The body mass index (BMI) is a screening tool to identify possible weight problems. It provides an estimate of body fat based on height and weight. Your health care provider can find your BMI and can help you achieve or maintain a healthy weight. For males 20 years and older:  A BMI below 18.5 is considered underweight.  A BMI of 18.5 to 24.9 is normal.  A BMI of 25 to 29.9 is considered overweight.  A BMI of 30 and above is considered obese.  Maintain  normal blood lipids and cholesterol by exercising and minimizing your intake of saturated fat. Eat a balanced diet with plenty of fruits and vegetables. Blood tests for lipids and cholesterol should begin at age 20 and be repeated every 5 years. If your lipid or cholesterol levels are high, you are over age 50, or you are at high risk for heart disease, you may need your cholesterol levels checked more frequently.Ongoing high lipid and cholesterol levels should be treated with medicines if diet and exercise are not working.  If you smoke, find out from your health care provider how to quit. If you do not use tobacco, do not start.  Lung cancer screening is recommended for adults aged 55-80 years who are at high risk for developing lung cancer because of a history of smoking. A yearly low-dose CT scan of the lungs is recommended for people who have at least a 30-pack-year history of smoking and are current smokers or have quit within the past 15 years. A pack year of smoking is smoking an average of 1 pack of cigarettes a day for 1 year (for example, a 30-pack-year history of smoking could mean smoking 1 pack a day for 30 years or 2 packs a day for 15 years). Yearly screening should continue until the smoker has stopped smoking for at least 15 years. Yearly screening should be stopped for people who develop a health problem that would prevent them from having lung cancer treatment.  If you choose to drink alcohol, do not have more than 2 drinks per day. One drink   is considered to be 12 oz (360 mL) of beer, 5 oz (150 mL) of wine, or 1.5 oz (45 mL) of liquor.  Avoid the use of street drugs. Do not share needles with anyone. Ask for help if you need support or instructions about stopping the use of drugs.  High blood pressure causes heart disease and increases the risk of stroke. Blood pressure should be checked at least every 1-2 years. Ongoing high blood pressure should be treated with medicines if weight  loss and exercise are not effective.  If you are 45-79 years old, ask your health care provider if you should take aspirin to prevent heart disease.  Diabetes screening involves taking a blood sample to check your fasting blood sugar level. This should be done once every 3 years after age 45 if you are at a normal weight and without risk factors for diabetes. Testing should be considered at a younger age or be carried out more frequently if you are overweight and have at least 1 risk factor for diabetes.  Colorectal cancer can be detected and often prevented. Most routine colorectal cancer screening begins at the age of 50 and continues through age 75. However, your health care provider may recommend screening at an earlier age if you have risk factors for colon cancer. On a yearly basis, your health care provider may provide home test kits to check for hidden blood in the stool. A small camera at the end of a tube may be used to directly examine the colon (sigmoidoscopy or colonoscopy) to detect the earliest forms of colorectal cancer. Talk to your health care provider about this at age 50 when routine screening begins. A direct exam of the colon should be repeated every 5-10 years through age 75, unless early forms of precancerous polyps or small growths are found.  People who are at an increased risk for hepatitis B should be screened for this virus. You are considered at high risk for hepatitis B if:  You were born in a country where hepatitis B occurs often. Talk with your health care provider about which countries are considered high risk.  Your parents were born in a high-risk country and you have not received a shot to protect against hepatitis B (hepatitis B vaccine).  You have HIV or AIDS.  You use needles to inject street drugs.  You live with, or have sex with, someone who has hepatitis B.  You are a man who has sex with other men (MSM).  You get hemodialysis treatment.  You take  certain medicines for conditions like cancer, organ transplantation, and autoimmune conditions.  Hepatitis C blood testing is recommended for all people born from 1945 through 1965 and any individual with known risk factors for hepatitis C.  Healthy men should no longer receive prostate-specific antigen (PSA) blood tests as part of routine cancer screening. Talk to your health care provider about prostate cancer screening.  Testicular cancer screening is not recommended for adolescents or adult males who have no symptoms. Screening includes self-exam, a health care provider exam, and other screening tests. Consult with your health care provider about any symptoms you have or any concerns you have about testicular cancer.  Practice safe sex. Use condoms and avoid high-risk sexual practices to reduce the spread of sexually transmitted infections (STIs).  You should be screened for STIs, including gonorrhea and chlamydia if:  You are sexually active and are younger than 24 years.  You are older than 24 years, and   your health care provider tells you that you are at risk for this type of infection.  Your sexual activity has changed since you were last screened, and you are at an increased risk for chlamydia or gonorrhea. Ask your health care provider if you are at risk.  If you are at risk of being infected with HIV, it is recommended that you take a prescription medicine daily to prevent HIV infection. This is called pre-exposure prophylaxis (PrEP). You are considered at risk if:  You are a man who has sex with other men (MSM).  You are a heterosexual man who is sexually active with multiple partners.  You take drugs by injection.  You are sexually active with a partner who has HIV.  Talk with your health care provider about whether you are at high risk of being infected with HIV. If you choose to begin PrEP, you should first be tested for HIV. You should then be tested every 3 months for as  long as you are taking PrEP.  Use sunscreen. Apply sunscreen liberally and repeatedly throughout the day. You should seek shade when your shadow is shorter than you. Protect yourself by wearing long sleeves, pants, a wide-brimmed hat, and sunglasses year round whenever you are outdoors.  Tell your health care provider of new moles or changes in moles, especially if there is a change in shape or color. Also, tell your health care provider if a mole is larger than the size of a pencil eraser.  A one-time screening for abdominal aortic aneurysm (AAA) and surgical repair of large AAAs by ultrasound is recommended for men aged 65-75 years who are current or former smokers.  Stay current with your vaccines (immunizations). Document Released: 11/19/2007 Document Revised: 05/28/2013 Document Reviewed: 10/18/2010 Exit[employer] Patient Information 2015 [employer]. This information is not intended to replace advice given to you by your health care provider. Make sure you discuss any questions you have with your health care provider.  

## 2013-12-19 NOTE — Assessment & Plan Note (Signed)
Interval history reviewed in depth In remission Released by oncology 07/2013 - will follow here as needed

## 2013-12-19 NOTE — Assessment & Plan Note (Signed)
Interval history reviewed Follows with infectious disease for same, currently well-controlled Continue antiviral therapy as prescribed

## 2013-12-20 ENCOUNTER — Encounter: Payer: Self-pay | Admitting: Internal Medicine

## 2013-12-26 ENCOUNTER — Other Ambulatory Visit: Payer: Self-pay | Admitting: Infectious Disease

## 2014-01-20 ENCOUNTER — Other Ambulatory Visit: Payer: Self-pay

## 2014-01-20 MED ORDER — ALPRAZOLAM 0.25 MG PO TABS: 0.2500 mg | ORAL_TABLET | Freq: Two times a day (BID) | ORAL | Status: DC | PRN

## 2014-01-22 ENCOUNTER — Other Ambulatory Visit: Payer: Self-pay

## 2014-01-22 MED ORDER — ALPRAZOLAM 0.25 MG PO TABS: 0.2500 mg | ORAL_TABLET | Freq: Two times a day (BID) | ORAL | Status: DC | PRN

## 2014-04-11 ENCOUNTER — Other Ambulatory Visit: Payer: Self-pay | Admitting: Infectious Disease

## 2014-04-11 DIAGNOSIS — B20 Human immunodeficiency virus [HIV] disease: Secondary | ICD-10-CM

## 2014-05-05 ENCOUNTER — Other Ambulatory Visit: Payer: Self-pay | Admitting: Infectious Disease

## 2014-05-05 ENCOUNTER — Telehealth: Payer: Self-pay | Admitting: *Deleted

## 2014-05-05 DIAGNOSIS — B20 Human immunodeficiency virus [HIV] disease: Secondary | ICD-10-CM

## 2014-05-05 MED ORDER — ABACAVIR-DOLUTEGRAVIR-LAMIVUD 600-50-300 MG PO TABS: 1.0000 | ORAL_TABLET | Freq: Every day | ORAL | Status: DC

## 2014-05-05 NOTE — Telephone Encounter (Signed)
Langley Gauss, THIS GUYS NEEDS HIS [place]. The dangers of him developing a compromised, spreading his disease to others will only be accentuated by his having his rx interrupted. He is a HIGHLy compliant patient. He definitely needs to followup but he needs his meds refilled

## 2014-05-05 NOTE — Telephone Encounter (Signed)
[facility] is unable to refill pt's HIV rx.  Pt needs to be seen for additional refills.  Message left for pt and with [facility].

## 2014-05-07 ENCOUNTER — Encounter (HOSPITAL_COMMUNITY): Payer: Self-pay | Admitting: Emergency Medicine

## 2014-05-07 ENCOUNTER — Emergency Department (HOSPITAL_COMMUNITY)
Admission: EM | Admit: 2014-05-07 | Discharge: 2014-05-07 | Disposition: A | Discharge status: Home / Self Care | Payer: 59 | Source: Home / Self Care | Attending: Family Medicine | Admitting: Family Medicine

## 2014-05-07 DIAGNOSIS — J40 Bronchitis, not specified as acute or chronic: Secondary | ICD-10-CM

## 2014-05-07 LAB — POCT RAPID STREP A: Streptococcus, Group A Screen (Direct): NEGATIVE

## 2014-05-07 MED ORDER — IPRATROPIUM-ALBUTEROL 0.5-2.5 (3) MG/3ML IN SOLN
3.0000 mL | Freq: Once | RESPIRATORY_TRACT | Status: AC
  Administered 2014-05-07: 3 mL via RESPIRATORY_TRACT

## 2014-05-07 MED ORDER — PROMETHAZINE-CODEINE 6.25-10 MG/5ML PO SYRP: 5.0000 mL | ORAL_SOLUTION | Freq: Every evening | ORAL | Status: DC | PRN

## 2014-05-07 MED ORDER — ALBUTEROL SULFATE HFA 108 (90 BASE) MCG/ACT IN AERS: 2.0000 | INHALATION_SPRAY | Freq: Four times a day (QID) | RESPIRATORY_TRACT | Status: DC | PRN

## 2014-05-07 MED ORDER — AZITHROMYCIN 250 MG PO TABS: 250.0000 mg | ORAL_TABLET | Freq: Every day | ORAL | Status: DC

## 2014-05-07 MED ORDER — IPRATROPIUM-ALBUTEROL 0.5-2.5 (3) MG/3ML IN SOLN
RESPIRATORY_TRACT | Status: AC
Start: 2014-05-07 — End: 2014-05-07
  Filled 2014-05-07: qty 3

## 2014-05-07 MED ORDER — IPRATROPIUM BROMIDE 0.06 % NA SOLN: 2.0000 | Freq: Four times a day (QID) | NASAL | Status: DC

## 2014-05-07 NOTE — ED Notes (Signed)
C/o ST, chest tightness and congestion Denies f/v/n/d, SOB, wheezing Alert, no signs of acute distress.

## 2014-05-07 NOTE — ED Provider Notes (Signed)
[name] is a 48 y.o. male who presents to Urgent Care today for sore throat cough congestion chest tightness and hoarse voice. Symptoms present for 3 days or so. If she has tried some over-the-counter medications which have not helped much. No chest pains or palpitations. He feels well otherwise. Cough is bothersome at bedtime.   Past Medical History  Diagnosis Date  . ALLERGIC RHINITIS   . Anal fissure     Chronic, recurrent anal bleeding with pain  . Depression   . HEADACHE, CHRONIC   . Hemangioma   . Hemorrhoid   . HIV INFECTION 07/05/2010 dx  . Intertrigo   . Molluscum contagiosum   . NEUTROPENIA, DRUG-INDUCED     on chemo early 2012  . 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 Thrombocytopenia   . Toe fracture, right 10/04/2012    great toe, nonsurgical mgmt Sharol Given)   Past Surgical History  Procedure Laterality Date  . Nasal septum surgery    . [place] fracture surgery  2010  . Precancer lesion removed from anus  2012   History  Substance Use Topics  . Smoking status: Former Research scientist (life sciences)  . 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 Alcohol Use: Yes     Comment: occasional, socially   ROS as above Medications: No current facility-administered medications for this encounter.   Current Outpatient Prescriptions  Medication Sig Dispense Refill  . Abacavir-Dolutegravir-Lamivud (TRIUMEQ) 600-50-300 MG TABS Take 1 tablet by mouth daily. 30 tablet 11  . amitriptyline (ELAVIL) 10 MG tablet Take 1 tablet (10 mg total) by mouth at bedtime as needed for sleep. 90 tablet 1  . citalopram (CELEXA) 40 MG tablet Take 1 tablet (40 mg total) by mouth daily. Take 1 tablet by mouth  daily 90 tablet 3  . albuterol (PROVENTIL HFA;VENTOLIN HFA)  108 (90 BASE) MCG/ACT inhaler Inhale 2 puffs into the lungs every 6 (six) hours as needed for wheezing or shortness of breath. 1 Inhaler 2  . ALPRAZolam (XANAX) 0.25 MG tablet Take 1 tablet (0.25 mg total) by mouth 2 (two) times daily as needed for anxiety. 60 tablet 3  . azithromycin (ZITHROMAX) 250 MG tablet Take 1 tablet (250 mg total) by mouth daily. Take first 2 tablets together, then 1 every day until finished. 6 tablet 0  . Biotin 1000 MCG tablet Take 1,000 mcg by mouth daily.    Marland Kitchen ipratropium (ATROVENT) 0.06 % nasal spray Place 2 sprays into both nostrils 4 (four) times daily. 15 mL 1  . Multiple Vitamin (ONE-A-DAY MENS PO) Take 1 capsule by mouth daily.    . promethazine-codeine (PHENERGAN WITH CODEINE) 6.25-10 MG/5ML syrup Take 5 mLs by mouth at bedtime as needed for cough. 120 mL 0   Allergies  Allergen Reactions  . Sulfonamide Derivatives     REACTION: rash, swelling, trouble breathing     Exam:  BP 133/90 mmHg  Pulse 66  Temp(Src) 97.2 F (36.2 C) (Oral)  Resp 18  SpO2 96% Gen: Well NAD HEENT: EOMI,  MMM normal posterior pharynx and tympanic membranes. Lungs: Normal work of breathing. CTABL hoarse voice Heart: RRR no MRG Abd: NABS, Soft. Nondistended, Nontender Exts: Brisk capillary refill, warm and well perfused.   Patient was  given a 2.5/0.5 mg DuoNeb nebulizer treatment, and felt better  Results for orders placed or performed during the hospital encounter of 05/07/14 (from the past 24 hour(s))  POCT rapid strep A [facility])     Status: None   Collection Time: 05/07/14  6:06 PM  Result Value Ref Range   Streptococcus, Group A Screen (Direct) NEGATIVE NEGATIVE   No results found.  Assessment and Plan: 48 y.o. male with bronchitis likely due to viral some reactive airway disease component. Patient has reasonably well-controlled HIV but has a low CD4 count due to previous cancer therapy.  Will treat with albuterol codeine containing cough medication and  Atrovent nasal spray. We azithromycin if not improving. If not better with the above therapy would recommend prednisone but would like to avoid for now for possible due to relative immune compromised status.  Discussed warning signs or symptoms. Please see discharge instructions. Patient expresses understanding.     Gregor Hams, MD 05/07/14 [phone]

## 2014-05-07 NOTE — Discharge Instructions (Signed)
Thank you for coming in today. Call or go to the emergency room if you get worse, have trouble breathing, have chest pains, or palpitations.  Take azithromycin if not getting better.  Acute Bronchitis Bronchitis is inflammation of the airways that extend from the windpipe into the lungs (bronchi). The inflammation often causes mucus to develop. This leads to a cough, which is the most common symptom of bronchitis.  In acute bronchitis, the condition usually develops suddenly and goes away over time, usually in a couple weeks. Smoking, allergies, and asthma can make bronchitis worse. Repeated episodes of bronchitis may cause further lung problems.  CAUSES Acute bronchitis is most often caused by the same virus that causes a cold. The virus can spread from person to person (contagious) through coughing, sneezing, and touching contaminated objects. SIGNS AND SYMPTOMS   Cough.   Fever.   Coughing up mucus.   Body aches.   Chest congestion.   Chills.   Shortness of breath.   Sore throat.  DIAGNOSIS  Acute bronchitis is usually diagnosed through a physical exam. Your health care provider will also ask you questions about your medical history. Tests, such as chest X-rays, are sometimes done to rule out other conditions.  TREATMENT  Acute bronchitis usually goes away in a couple weeks. Oftentimes, no medical treatment is necessary. Medicines are sometimes given for relief of fever or cough. Antibiotic medicines are usually not needed but may be prescribed in certain situations. In some cases, an inhaler may be recommended to help reduce shortness of breath and control the cough. A cool mist vaporizer may also be used to help thin bronchial secretions and make it easier to clear the chest.  HOME CARE INSTRUCTIONS  Get plenty of rest.   Drink enough fluids to keep your urine clear or pale yellow (unless you have a medical condition that requires fluid restriction). Increasing fluids  may help thin your respiratory secretions (sputum) and reduce chest congestion, and it will prevent dehydration.   Take medicines only as directed by your health care provider.  If you were prescribed an antibiotic medicine, finish it all even if you start to feel better.  Avoid smoking and secondhand smoke. Exposure to cigarette smoke or irritating chemicals will make bronchitis worse. If you are a smoker, consider using nicotine gum or skin patches to help control withdrawal symptoms. Quitting smoking will help your lungs heal faster.   Reduce the chances of another bout of acute bronchitis by washing your hands frequently, avoiding people with cold symptoms, and trying not to touch your hands to your mouth, nose, or eyes.   Keep all follow-up visits as directed by your health care provider.  SEEK MEDICAL CARE IF: Your symptoms do not improve after 1 week of treatment.  SEEK IMMEDIATE MEDICAL CARE IF:  You develop an increased fever or chills.   You have chest pain.   You have severe shortness of breath.  You have bloody sputum.   You develop dehydration.  You faint or repeatedly feel like you are going to pass out.  You develop repeated vomiting.  You develop a severe headache. MAKE SURE YOU:   Understand these instructions.  Will watch your condition.  Will get help right away if you are not doing well or get worse. Document Released: 06/30/2004 Document Revised: 10/07/2013 Document Reviewed: 11/13/2012 [facility] Patient Information 2015 [place], [place]. This information is not intended to replace advice given to you by your health care provider. Make sure you discuss  any questions you have with your health care provider. ° °

## 2014-05-09 ENCOUNTER — Other Ambulatory Visit: Payer: 59

## 2014-05-09 DIAGNOSIS — B20 Human immunodeficiency virus [HIV] disease: Secondary | ICD-10-CM

## 2014-05-09 LAB — COMPLETE METABOLIC PANEL WITH GFR
ALT: 21 U/L (ref 0–53)
AST: 18 U/L (ref 0–37)
Albumin: 4.3 g/dL (ref 3.5–5.2)
Alkaline Phosphatase: 71 U/L (ref 39–117)
BUN: 12 mg/dL (ref 6–23)
CALCIUM: 9.6 mg/dL (ref 8.4–10.5)
CHLORIDE: 104 meq/L (ref 96–112)
CO2: 27 mEq/L (ref 19–32)
CREATININE: 1.09 mg/dL (ref 0.50–1.35)
GFR, Est African American: >89 mL/min
GFR, Est Non African American: 80 mL/min
Glucose, Bld: 96 mg/dL (ref 70–99)
Potassium: 4.7 mEq/L (ref 3.5–5.3)
SODIUM: 139 meq/L (ref 135–145)
Total Bilirubin: 0.5 mg/dL (ref 0.2–1.2)
Total Protein: 6.9 g/dL (ref 6.0–8.3)

## 2014-05-09 LAB — CBC WITH DIFFERENTIAL/PLATELET
Basophils Absolute: 0 10*3/uL (ref 0.0–0.1)
Basophils Relative: 1 % (ref 0–1)
EOS PCT: 4 % (ref 0–5)
Eosinophils Absolute: 0.2 10*3/uL (ref 0.0–0.7)
HCT: 43.3 % (ref 39.0–52.0)
HEMOGLOBIN: 15.1 g/dL (ref 13.0–17.0)
LYMPHS ABS: 1 10*3/uL (ref 0.7–4.0)
Lymphocytes Relative: 21 % (ref 12–46)
MCH: 33.6 pg (ref 26.0–34.0)
MCHC: 34.9 g/dL (ref 30.0–36.0)
MCV: 96.4 fL (ref 78.0–100.0)
MONO ABS: 0.3 10*3/uL (ref 0.1–1.0)
MPV: 11.1 fL (ref 9.4–12.4)
Monocytes Relative: 7 % (ref 3–12)
Neutro Abs: 3.3 10*3/uL (ref 1.7–7.7)
Neutrophils Relative %: 67 % (ref 43–77)
PLATELETS: 186 10*3/uL (ref 150–400)
RBC: 4.49 MIL/uL (ref 4.22–5.81)
RDW: 13.5 % (ref 11.5–15.5)
WBC: 4.9 10*3/uL (ref 4.0–10.5)

## 2014-05-09 LAB — T-HELPER CELL (CD4) - (RCID CLINIC ONLY)
CD4 T CELL HELPER: 19 % — AB (ref 33–55)
CD4 T Cell Abs: 220 /uL — ABNORMAL LOW (ref 400–2700)

## 2014-05-09 LAB — CULTURE, GROUP A STREP

## 2014-05-10 ENCOUNTER — Other Ambulatory Visit: Payer: Self-pay | Admitting: Internal Medicine

## 2014-05-12 LAB — HIV-1 RNA QUANT-NO REFLEX-BLD
HIV 1 RNA Quant: <20 {copies}/mL
HIV-1 RNA Quant, Log: <1.3 {Log}

## 2014-05-12 NOTE — Telephone Encounter (Addendum)
Pt's Triumeq was shipped out from [facility] on 05/05/14.   He has a scheduled appt on 05/26/14 w/ Dr. Tommy Medal.

## 2014-05-12 NOTE — Addendum Note (Signed)
Addended by: Lorne Skeens D on: 05/12/2014 11:34 AM   Modules accepted: Orders

## 2014-05-14 ENCOUNTER — Encounter (HOSPITAL_COMMUNITY): Payer: Self-pay | Admitting: Emergency Medicine

## 2014-05-14 ENCOUNTER — Emergency Department (HOSPITAL_COMMUNITY)
Admission: EM | Admit: 2014-05-14 | Discharge: 2014-05-14 | Disposition: A | Discharge status: Home / Self Care | Payer: 59 | Source: Home / Self Care | Attending: Emergency Medicine | Admitting: Emergency Medicine

## 2014-05-14 DIAGNOSIS — J019 Acute sinusitis, unspecified: Secondary | ICD-10-CM

## 2014-05-14 MED ORDER — PREDNISONE 20 MG PO TABS
20.0000 mg | ORAL_TABLET | Freq: Two times a day (BID) | ORAL | Status: DC
Start: 2014-05-14 — End: 2014-12-23

## 2014-05-14 MED ORDER — AMOXICILLIN-POT CLAVULANATE 875-125 MG PO TABS: 1.0000 | ORAL_TABLET | Freq: Two times a day (BID) | ORAL | Status: DC

## 2014-05-14 NOTE — ED Notes (Addendum)
C/o laryngitis; seen here on 12/02 for bronchitis Given albuterol, azithromycin, atroven Also c/o persistent cough, congestion and runny nose Denies f/v/ Alert, no signs of acute distress.

## 2014-05-14 NOTE — Discharge Instructions (Signed)

## 2014-05-14 NOTE — ED Provider Notes (Signed)
  Chief Complaint   Laryngitis   History of Present Illness   [name] is a 48 year old male who was seen here week ago with a three-day history of nasal congestion, cough, and hoarseness. He was treated with cough syrup, albuterol, nasal spray, Z-Pak. He felt a little bit better then worse again. Right now his symptoms consist of hoarseness, sore throat, chills, nasal congestion with green, sometimes bloody rhinorrhea, headache, sinus pressure. His cough is a little bit better and bringing up some tan to brown sputum. He denies any fever or GI symptoms.  Review of Systems   Other than as noted above, the patient denies any of the following symptoms: Systemic:  No fevers, chills, sweats, or myalgias. Eye:  No redness or discharge. ENT:  No ear pain, headache, nasal congestion, drainage, sinus pressure, or sore throat. Neck:  No neck pain, stiffness, or swollen glands. Lungs:  No cough, sputum production, hemoptysis, wheezing, chest tightness, shortness of breath or chest pain. GI:  No abdominal pain, nausea, vomiting or diarrhea.  [place]   Past medical history, family history, social history, meds, and allergies were reviewed. He is HIV positive. His most recent CD4 count about 4 days ago was 220. His viral titer is undetectable. He also has a history of lymphoma.  Physical exam   Vital signs:  BP 124/87 mmHg  Pulse 61  Temp(Src) 97.6 F (36.4 C) (Oral)  Resp 12  SpO2 96% General:  Alert and oriented.  In no distress.  Skin warm and dry. Eye:  No conjunctival injection or drainage. Lids were normal. ENT:  TMs and canals were normal, without erythema or inflammation.  Nasal mucosa was congested with yellow drainage.  Mucous membranes were moist.  Pharynx was erythematous.  There were no oral ulcerations or lesions. Neck:  Supple, no adenopathy, tenderness or mass. Lungs:  No respiratory distress.  Lungs were clear to auscultation, without wheezes, rales or rhonchi.  Breath  sounds were clear and equal bilaterally.  Heart:  Regular rhythm, without gallops, murmers or rubs. Skin:  Clear, warm, and dry, without rash or lesions.   Assessment     The encounter diagnosis was Acute sinusitis, recurrence not specified, unspecified location.  There is no evidence of pneumonia, strep throat, otitis media.    Plan    1.  Meds:  The following meds were prescribed:   New Prescriptions   AMOXICILLIN-CLAVULANATE (AUGMENTIN) 875-125 MG PER TABLET    Take 1 tablet by mouth 2 (two) times daily.   PREDNISONE (DELTASONE) 20 MG TABLET    Take 1 tablet (20 mg total) by mouth 2 (two) times daily.    2.  Patient Education/Counseling:  The patient was given appropriate handouts, self care instructions, and instructed in symptomatic relief.  Instructed to get extra fluids and extra rest.    3.  Follow up:  The patient was told to follow up here if no better in 3 to 4 days, or sooner if becoming worse in any way, and given some red flag symptoms such as increasing fever, difficulty breathing, chest pain, or persistent vomiting which would prompt immediate return.       Harden Mo, MD 05/14/14 [phone]

## 2014-05-26 ENCOUNTER — Encounter: Payer: Self-pay | Admitting: Infectious Disease

## 2014-05-26 ENCOUNTER — Ambulatory Visit (INDEPENDENT_AMBULATORY_CARE_PROVIDER_SITE_OTHER): Payer: 59 | Admitting: Infectious Disease

## 2014-05-26 VITALS — BP 135/88 | HR 69 | Temp 98.1°F | Wt 187.0 lb

## 2014-05-26 DIAGNOSIS — D099 Carcinoma in situ, unspecified: Secondary | ICD-10-CM

## 2014-05-26 DIAGNOSIS — C8590 Non-Hodgkin lymphoma, unspecified, unspecified site: Secondary | ICD-10-CM | POA: Insufficient documentation

## 2014-05-26 DIAGNOSIS — B20 Human immunodeficiency virus [HIV] disease: Secondary | ICD-10-CM

## 2014-05-26 DIAGNOSIS — F32A Depression, unspecified: Secondary | ICD-10-CM

## 2014-05-26 DIAGNOSIS — K602 Anal fissure, unspecified: Secondary | ICD-10-CM

## 2014-05-26 DIAGNOSIS — F329 Major depressive disorder, single episode, unspecified: Secondary | ICD-10-CM

## 2014-05-26 NOTE — Progress Notes (Signed)
  Subjective:    Patient ID: [name], male    DOB: 1966-02-28, 48 y.o.   MRN: [id number]  HPI   Merick Kelleher is a 48 y.o. male who is  doing superbly well on his  antiviral regimen, currently changed to  once daily TRIUMEQ.   Lab Results  Component Value Date   HIV1RNAQUANT <20 05/09/2014   Lab Results  Component Value Date   CD4TABS 220* 05/09/2014   CD4TABS 290* 10/09/2013   CD4TABS 200* 04/03/2013    He is suffering from some pain related to an anal fissure but has not resolved. He is asking for referral to surgical Center for more definitive treatment.  Review of Systems  Constitutional: Negative for fever, chills, diaphoresis, activity change, appetite change, fatigue and unexpected weight change.  HENT: Negative for rhinorrhea, sinus pressure, sneezing and sore throat.   Eyes: Negative for photophobia and visual disturbance.  Respiratory: Negative for chest tightness and wheezing.   Cardiovascular: Negative for chest pain, palpitations and leg swelling.  Gastrointestinal: Positive for rectal pain. Negative for nausea, constipation, blood in stool, abdominal distention and anal bleeding.  Genitourinary: Negative for dysuria, hematuria, flank pain and difficulty urinating.  Musculoskeletal: Negative for myalgias, back pain, joint swelling, arthralgias and gait problem.  Skin: Negative for color change, pallor, rash and wound.  Neurological: Negative for dizziness, tremors, weakness and light-headedness.  Hematological: Negative for adenopathy. Does not bruise/bleed easily.  Psychiatric/Behavioral: Negative for behavioral problems, confusion, sleep disturbance, dysphoric mood, decreased concentration and agitation.       Objective:   Physical Exam  Constitutional: He is oriented to person, place, and time. He appears well-developed and well-nourished.  HENT:  Head: Normocephalic and atraumatic.  Eyes: Conjunctivae and EOM are normal.  Neck: Normal range of  motion. Neck supple.  Cardiovascular: Normal rate and regular rhythm.   Pulmonary/Chest: Effort normal. No respiratory distress. He has no wheezes.  Abdominal: Soft. He exhibits no distension.  Musculoskeletal: Normal range of motion. He exhibits no edema or tenderness.  Neurological: He is alert and oriented to person, place, and time.  Skin: Skin is warm and dry. No rash noted. No erythema. No pallor.  Psychiatric: He has a normal mood and affect. His behavior is normal. Judgment and thought content normal.          Assessment & Plan:  HIV: continue TRIUMEQ , RTC 6 months for new labs. I spent greater than 25 minutes with the patient including greater than 50% of time in face to face counsel of the patient and in coordination of their care.   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 Surgery  Anal fissure: will refer to [facility]  Depression continue Celexa.

## 2014-05-26 NOTE — Addendum Note (Signed)
Addended by: Jarrett Ables D on: 05/26/2014 03:33 PM   Modules accepted: Orders

## 2014-06-10 ENCOUNTER — Ambulatory Visit (INDEPENDENT_AMBULATORY_CARE_PROVIDER_SITE_OTHER): Payer: Self-pay | Admitting: *Deleted

## 2014-06-10 VITALS — BP 130/84 | HR 55 | Temp 97.9°F | Resp 16 | Ht 69.25 in | Wt 183.2 lb

## 2014-06-10 DIAGNOSIS — Z006 Encounter for examination for normal comparison and control in clinical research program: Secondary | ICD-10-CM

## 2014-06-10 DIAGNOSIS — B20 Human immunodeficiency virus [HIV] disease: Secondary | ICD-10-CM

## 2014-06-10 LAB — LIPID PANEL
CHOL/HDL RATIO: 4.4 ratio
CHOLESTEROL: 197 mg/dL (ref 0–200)
HDL: 45 mg/dL (ref >39)
LDL Cholesterol: 128 mg/dL — ABNORMAL HIGH (ref 0–99)
TRIGLYCERIDES: 120 mg/dL (ref <150)
VLDL: 24 mg/dL (ref 0–40)

## 2014-06-10 LAB — COMPREHENSIVE METABOLIC PANEL
ALT: 28 U/L (ref 0–53)
AST: 24 U/L (ref 0–37)
Albumin: 4.2 g/dL (ref 3.5–5.2)
Alkaline Phosphatase: 76 U/L (ref 39–117)
BUN: 15 mg/dL (ref 6–23)
CALCIUM: 9.2 mg/dL (ref 8.4–10.5)
CHLORIDE: 107 meq/L (ref 96–112)
CO2: 27 meq/L (ref 19–32)
CREATININE: 1.11 mg/dL (ref 0.50–1.35)
GLUCOSE: 83 mg/dL (ref 70–99)
Potassium: 4.5 mEq/L (ref 3.5–5.3)
Sodium: 144 mEq/L (ref 135–145)
Total Bilirubin: 0.7 mg/dL (ref 0.2–1.2)
Total Protein: 6.6 g/dL (ref 6.0–8.3)

## 2014-06-10 NOTE — Progress Notes (Signed)
   Subjective:    Patient ID: [name], male    DOB: 04-Jul-1965, 49 y.o.   MRN: [id number]  HPI    Review of Systems  Constitutional: Negative.   HENT: Negative.   Eyes: Negative.   Respiratory: Negative.   Cardiovascular: Negative.   Gastrointestinal: Negative.   Genitourinary: Negative.   Musculoskeletal: Negative.   Skin: Negative.   Neurological: Negative.   Psychiatric/Behavioral: Negative.        Objective:   Physical Exam  Constitutional: He is oriented to person, place, and time.  HENT:  Mouth/Throat: Oropharynx is clear and moist.  Eyes: No scleral icterus.  Cardiovascular: Normal rate, regular rhythm, normal heart sounds and intact distal pulses.   Pulmonary/Chest: Effort normal and breath sounds normal.  Abdominal: Soft. Bowel sounds are normal.  Musculoskeletal: Normal range of motion. He exhibits no edema.  Lymphadenopathy:    He has no cervical adenopathy.  Neurological: He is alert and oriented to person, place, and time.  Skin: Skin is warm and dry.  Psychiatric: He has a normal mood and affect.          Assessment & Plan:  [name] is here today to screen for the Reprieve study. Informed consent was obtained after he read over the consents and we discussed it in detail. He is also interested in participating in the substudy with the cardiac CTs. He was able to verbalize the purpose of the study and his responsibilities.  He denies any current problems. He does have a history of NHL and squamous cell carcinoma-insitu of the rectum early in his diagnosis of HIV. He has an anal fissure which he has had for quite awhile that he wants to see surgery about correcting. He was diagnosed in 2012 when he became very ill and was diagnosed with the NHL. His entry is tentatively planned for 06/27/14.

## 2014-06-11 ENCOUNTER — Encounter: Payer: Self-pay | Admitting: Infectious Disease

## 2014-06-13 ENCOUNTER — Telehealth: Payer: Self-pay | Admitting: *Deleted

## 2014-06-13 NOTE — Telephone Encounter (Signed)
Received call from pt requesting copy of OV note to Dr Drucilla Schmidt showing that he has been cancer-free for 3 years for a clinical trial that they would like him to participate in.  Faxed Release of information to pt's work # @ [phone] & after received back, sent Dr. Azucena Freed last [facility] note from 07/30/13.

## 2014-06-16 ENCOUNTER — Other Ambulatory Visit: Payer: Self-pay | Admitting: Infectious Disease

## 2014-06-16 DIAGNOSIS — Z006 Encounter for examination for normal comparison and control in clinical research program: Secondary | ICD-10-CM

## 2014-07-11 ENCOUNTER — Ambulatory Visit (HOSPITAL_COMMUNITY): Payer: Self-pay

## 2014-07-14 ENCOUNTER — Ambulatory Visit (INDEPENDENT_AMBULATORY_CARE_PROVIDER_SITE_OTHER): Payer: Self-pay | Admitting: *Deleted

## 2014-07-14 DIAGNOSIS — Z006 Encounter for examination for normal comparison and control in clinical research program: Secondary | ICD-10-CM

## 2014-07-14 LAB — COMPREHENSIVE METABOLIC PANEL
ALBUMIN: 4.4 g/dL (ref 3.5–5.2)
ALK PHOS: 85 U/L (ref 39–117)
ALT: 25 U/L (ref 0–53)
AST: 20 U/L (ref 0–37)
BUN: 15 mg/dL (ref 6–23)
CALCIUM: 9.6 mg/dL (ref 8.4–10.5)
CHLORIDE: 102 meq/L (ref 96–112)
CO2: 28 mEq/L (ref 19–32)
CREATININE: 1.12 mg/dL (ref 0.50–1.35)
GLUCOSE: 100 mg/dL — AB (ref 70–99)
Potassium: 4.6 mEq/L (ref 3.5–5.3)
Sodium: 141 mEq/L (ref 135–145)
TOTAL PROTEIN: 7.1 g/dL (ref 6.0–8.3)
Total Bilirubin: 0.5 mg/dL (ref 0.2–1.2)

## 2014-07-14 NOTE — Progress Notes (Signed)
[name] is here today for lab draw only. CMET drawn prior to CT scan on Friday.

## 2014-07-18 ENCOUNTER — Encounter (HOSPITAL_COMMUNITY): Payer: Self-pay

## 2014-07-18 ENCOUNTER — Ambulatory Visit (INDEPENDENT_AMBULATORY_CARE_PROVIDER_SITE_OTHER): Payer: Self-pay | Admitting: *Deleted

## 2014-07-18 ENCOUNTER — Ambulatory Visit (HOSPITAL_COMMUNITY)
Admission: RE | Admit: 2014-07-18 | Discharge: 2014-07-18 | Disposition: A | Discharge status: Home / Self Care | Payer: 59 | Source: Ambulatory Visit | Attending: Infectious Disease | Admitting: Infectious Disease

## 2014-07-18 VITALS — BP 121/85 | HR 54 | Temp 97.8°F | Resp 18 | Wt 183.0 lb

## 2014-07-18 DIAGNOSIS — R7989 Other specified abnormal findings of blood chemistry: Secondary | ICD-10-CM

## 2014-07-18 DIAGNOSIS — Z006 Encounter for examination for normal comparison and control in clinical research program: Secondary | ICD-10-CM

## 2014-07-18 DIAGNOSIS — I708 Atherosclerosis of other arteries: Secondary | ICD-10-CM | POA: Insufficient documentation

## 2014-07-18 DIAGNOSIS — J439 Emphysema, unspecified: Secondary | ICD-10-CM | POA: Insufficient documentation

## 2014-07-18 LAB — COMPREHENSIVE METABOLIC PANEL
ALBUMIN: 4.1 g/dL (ref 3.5–5.2)
ALT: 25 U/L (ref 0–53)
AST: 22 U/L (ref 0–37)
Alkaline Phosphatase: 76 U/L (ref 39–117)
BUN: 15 mg/dL (ref 6–23)
CALCIUM: 9.3 mg/dL (ref 8.4–10.5)
CHLORIDE: 106 meq/L (ref 96–112)
CO2: 28 meq/L (ref 19–32)
Creat: 1.2 mg/dL (ref 0.50–1.35)
Glucose, Bld: 96 mg/dL (ref 70–99)
Potassium: 4.6 mEq/L (ref 3.5–5.3)
SODIUM: 141 meq/L (ref 135–145)
TOTAL PROTEIN: 6.7 g/dL (ref 6.0–8.3)
Total Bilirubin: 0.7 mg/dL (ref 0.2–1.2)

## 2014-07-18 MED ORDER — PITAVASTATIN CALCIUM 4 MG PO TABS: 4.0000 mg | ORAL_TABLET | Freq: Every day | ORAL | Status: AC

## 2014-07-18 MED ORDER — IOHEXOL 350 MG/ML SOLN
80.0000 mL | Freq: Once | INTRAVENOUS | Status: AC | PRN
  Administered 2014-07-18: 80 mL via INTRAVENOUS

## 2014-07-18 NOTE — Progress Notes (Signed)
Terre was enrolled on both [id number] Reprieve and [phone] the substudy for Reprieve today. He denies any current problems or concerns. An EKG was obtained and pulse stayed in the 50s. He will come back this coming Monday to get the study drug, Pitavastatin/placebo and will start drug that day. A coronary CT was performed according to the Reprieve protocol. He was not administered the lopressor for the procedure as his heart rate stayed in the 40s to 50s. BP right before the scan was 121/75. He was given contrast and nitro as directed by protocol without any adverse effects. He was given the lifestyle guidelines for diet and exercise, which he already follows. He works out over 3 times a week and has a decent diet. He was instructed on the side effects of the drug and when to call for problems, such as rash, muscle aches, etc.His next study visit will be in 19month for followup.

## 2014-07-18 NOTE — Addendum Note (Signed)
Addended by: Bobbie Stack on: 07/18/2014 01:06 PM   Modules accepted: Orders

## 2014-07-19 LAB — HIV-1 RNA QUANT-NO REFLEX-BLD: HIV-1 RNA Quant, Log: <1.3 {Log} (ref <1.30)

## 2014-07-21 ENCOUNTER — Other Ambulatory Visit: Payer: Self-pay | Admitting: Infectious Disease

## 2014-07-21 LAB — TESTOSTERONE, FREE, TOTAL, SHBG
Sex Hormone Binding: 12 nmol/L (ref 10–50)
TESTOSTERONE FREE: 69.4 pg/mL (ref 47.0–244.0)
Testosterone-% Free: 3 % — ABNORMAL HIGH (ref 1.6–2.9)
Testosterone: 230 ng/dL — ABNORMAL LOW (ref 300–890)

## 2014-07-23 ENCOUNTER — Encounter: Payer: Self-pay | Admitting: Infectious Disease

## 2014-07-23 LAB — CD4/CD8 (T-HELPER/T-SUPPRESSOR CELL)
CD4 T CELL HELPER: 24.9
CD4: 249
CD8 T CELL SUPPRESSOR: 41.8
CD8: 418

## 2014-07-25 ENCOUNTER — Encounter: Payer: Self-pay | Admitting: Infectious Disease

## 2014-07-31 ENCOUNTER — Encounter: Payer: Self-pay | Admitting: Infectious Disease

## 2014-08-15 ENCOUNTER — Ambulatory Visit (INDEPENDENT_AMBULATORY_CARE_PROVIDER_SITE_OTHER): Payer: Self-pay | Admitting: *Deleted

## 2014-08-15 VITALS — BP 123/83 | HR 56 | Temp 98.0°F | Resp 16 | Wt 184.2 lb

## 2014-08-15 DIAGNOSIS — Z006 Encounter for examination for normal comparison and control in clinical research program: Secondary | ICD-10-CM

## 2014-08-15 NOTE — Progress Notes (Signed)
[name] is here for his 1 month Reprieve study visit. He denies any muscle weakness or pain. But he has noticed that the balls of his feet ache when he takes his shoes off the past couple weeks. He has an appt to see Dr. Tommy Medal later this month about testosterone replacement and research on June 7th for the 4 month visit.

## 2014-08-16 LAB — COMPREHENSIVE METABOLIC PANEL
ALK PHOS: 80 U/L (ref 39–117)
ALT: 28 U/L (ref 0–53)
AST: 23 U/L (ref 0–37)
Albumin: 4.4 g/dL (ref 3.5–5.2)
BUN: 15 mg/dL (ref 6–23)
CALCIUM: 9.1 mg/dL (ref 8.4–10.5)
CO2: 26 mEq/L (ref 19–32)
CREATININE: 1.2 mg/dL (ref 0.50–1.35)
Chloride: 106 mEq/L (ref 96–112)
Glucose, Bld: 83 mg/dL (ref 70–99)
Potassium: 4.2 mEq/L (ref 3.5–5.3)
Sodium: 141 mEq/L (ref 135–145)
Total Bilirubin: 0.4 mg/dL (ref 0.2–1.2)
Total Protein: 6.9 g/dL (ref 6.0–8.3)

## 2014-08-22 ENCOUNTER — Other Ambulatory Visit: Payer: Self-pay | Admitting: Internal Medicine

## 2014-08-26 ENCOUNTER — Ambulatory Visit: Payer: 59 | Admitting: Infectious Disease

## 2014-08-27 ENCOUNTER — Other Ambulatory Visit: Payer: Self-pay | Admitting: *Deleted

## 2014-08-27 ENCOUNTER — Ambulatory Visit: Payer: 59 | Admitting: Infectious Disease

## 2014-08-27 ENCOUNTER — Encounter: Payer: Self-pay | Admitting: Infectious Disease

## 2014-08-27 ENCOUNTER — Ambulatory Visit (INDEPENDENT_AMBULATORY_CARE_PROVIDER_SITE_OTHER): Payer: 59 | Admitting: Infectious Disease

## 2014-08-27 VITALS — BP 133/83 | HR 62 | Temp 97.8°F | Wt 183.8 lb

## 2014-08-27 DIAGNOSIS — E291 Testicular hypofunction: Secondary | ICD-10-CM

## 2014-08-27 DIAGNOSIS — B20 Human immunodeficiency virus [HIV] disease: Secondary | ICD-10-CM

## 2014-08-27 DIAGNOSIS — C8590 Non-Hodgkin lymphoma, unspecified, unspecified site: Secondary | ICD-10-CM

## 2014-08-27 NOTE — Telephone Encounter (Signed)
Received fax requesting renewal in his alprazolam. Will hold to md return tomorrow...[name]

## 2014-08-27 NOTE — Progress Notes (Signed)
  Subjective:    Patient ID: [name], male    DOB: February 21, 1966, 49 y.o.   MRN: [id number]  HPI   [name] is a 49 y.o. male who is  doing superbly well on his  antiviral regimen, currently changed to  once daily TRIUMEQ. He is here to discuss his low serum testosterone.  Serum testosterone was slightly low, his Sex binding globulin and free testosterone were normal   Lab Results  Component Value Date   HIV1RNAQUANT <20 07/18/2014   Lab Results  Component Value Date   CD4TABS 249 07/18/2014   CD4TABS 220* 05/09/2014   CD4TABS 290* 10/09/2013     Review of Systems  Constitutional: Negative for fever, chills, diaphoresis, activity change, appetite change, fatigue and unexpected weight change.  HENT: Negative for rhinorrhea, sinus pressure, sneezing and sore throat.   Eyes: Negative for photophobia and visual disturbance.  Respiratory: Negative for chest tightness and wheezing.   Cardiovascular: Negative for chest pain, palpitations and leg swelling.  Gastrointestinal: Positive for rectal pain. Negative for nausea, constipation, blood in stool, abdominal distention and anal bleeding.  Genitourinary: Negative for dysuria, hematuria, flank pain and difficulty urinating.  Musculoskeletal: Negative for myalgias, back pain, joint swelling, arthralgias and gait problem.  Skin: Negative for color change, pallor, rash and wound.  Neurological: Negative for dizziness, tremors, weakness and light-headedness.  Hematological: Negative for adenopathy. Does not bruise/bleed easily.  Psychiatric/Behavioral: Negative for behavioral problems, confusion, sleep disturbance, dysphoric mood, decreased concentration and agitation.       Objective:   Physical Exam  Constitutional: He is oriented to person, place, and time. He appears well-developed and well-nourished.  HENT:  Head: Normocephalic and atraumatic.  Eyes: Conjunctivae and EOM are normal.  Neck: Normal range of motion.  Neck supple.  Cardiovascular: Normal rate and regular rhythm.   Pulmonary/Chest: Effort normal. No respiratory distress. He has no wheezes.  Abdominal: Soft. He exhibits no distension.  Musculoskeletal: Normal range of motion. He exhibits no edema or tenderness.  Neurological: He is alert and oriented to person, place, and time.  Skin: Skin is warm and dry. No rash noted. No erythema. No pallor.  Psychiatric: He has a normal mood and affect. His behavior is normal. Judgment and thought content normal.          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 Surgery  Anal fissure: to see [facility]  Depression continue Celexa.  Low testosterone: I will recheck testosterone am level with FSH, LH and go ahead and get the next set of labs IF he was felt to have secondary hypogonadism by checking serum ferritin, TSH, T4, prolactin.

## 2014-08-28 MED ORDER — ALPRAZOLAM 0.25 MG PO TABS: 0.2500 mg | ORAL_TABLET | Freq: Two times a day (BID) | ORAL | Status: DC | PRN

## 2014-08-28 NOTE — Telephone Encounter (Signed)
Per md ok to call refill into [facility] call refill in spoke with pharmacist Manju gave md approval. Updated Epic...Johny Chess

## 2014-09-29 ENCOUNTER — Encounter: Payer: Self-pay | Admitting: Infectious Disease

## 2014-09-29 ENCOUNTER — Other Ambulatory Visit: Payer: Self-pay | Admitting: *Deleted

## 2014-09-29 DIAGNOSIS — B20 Human immunodeficiency virus [HIV] disease: Secondary | ICD-10-CM

## 2014-09-29 MED ORDER — ABACAVIR-DOLUTEGRAVIR-LAMIVUD 600-50-300 MG PO TABS: 1.0000 | ORAL_TABLET | Freq: Every day | ORAL | Status: DC

## 2014-10-01 ENCOUNTER — Other Ambulatory Visit: Payer: Self-pay

## 2014-10-01 MED ORDER — ALPRAZOLAM 0.25 MG PO TABS: 0.2500 mg | ORAL_TABLET | Freq: Two times a day (BID) | ORAL | Status: DC | PRN

## 2014-10-28 ENCOUNTER — Other Ambulatory Visit: Payer: Self-pay | Admitting: Infectious Disease

## 2014-10-29 LAB — TSH+FREE T4
Free T4: 1.08 ng/dL (ref 0.82–1.77)
TSH: 1.75 u[IU]/mL (ref 0.450–4.500)

## 2014-10-29 LAB — PROLACTIN: PROLACTIN: 4.7 ng/mL (ref 4.0–15.2)

## 2014-10-29 LAB — FSH/LH
FSH: 3.8 m[IU]/mL (ref 1.5–12.4)
LH: 5.4 m[IU]/mL (ref 1.7–8.6)

## 2014-10-29 LAB — FERRITIN: FERRITIN: 74 ng/mL (ref 30–400)

## 2014-10-29 LAB — IRON: IRON: 65 ug/dL (ref 38–169)

## 2014-10-29 LAB — TESTOSTERONE: Testosterone: 236 ng/dL — ABNORMAL LOW (ref 348–1197)

## 2014-10-29 LAB — CORTISOL-AM, BLOOD: CORTISOL - AM: 25.5 ug/dL — AB (ref 6.2–19.4)

## 2014-10-30 ENCOUNTER — Telehealth: Payer: Self-pay | Admitting: Infectious Disease

## 2014-10-30 ENCOUNTER — Encounter: Payer: Self-pay | Admitting: Infectious Disease

## 2014-10-30 MED ORDER — TESTOSTERONE CYPIONATE 100 MG/ML IM SOLN: 100.0000 mg | INTRAMUSCULAR | Status: DC

## 2014-10-30 NOTE — Telephone Encounter (Signed)
[name], I have sent in rx for IM testosterone for [facility]

## 2014-11-05 ENCOUNTER — Telehealth: Payer: Self-pay | Admitting: *Deleted

## 2014-11-05 NOTE — Telephone Encounter (Signed)
PA approved until 05/07/15 for testosterone cypionate from [facility].  Received fax confirmation today.  [id number].

## 2014-11-11 ENCOUNTER — Encounter (INDEPENDENT_AMBULATORY_CARE_PROVIDER_SITE_OTHER): Payer: Self-pay | Admitting: *Deleted

## 2014-11-11 ENCOUNTER — Other Ambulatory Visit: Payer: 59

## 2014-11-11 VITALS — BP 112/75 | HR 60 | Temp 98.2°F | Resp 16 | Wt 183.2 lb

## 2014-11-11 DIAGNOSIS — B20 Human immunodeficiency virus [HIV] disease: Secondary | ICD-10-CM

## 2014-11-11 DIAGNOSIS — Z006 Encounter for examination for normal comparison and control in clinical research program: Secondary | ICD-10-CM

## 2014-11-11 LAB — COMPLETE METABOLIC PANEL WITH GFR
ALK PHOS: 70 U/L (ref 39–117)
ALT: 32 U/L (ref 0–53)
AST: 25 U/L (ref 0–37)
Albumin: 3.9 g/dL (ref 3.5–5.2)
BILIRUBIN TOTAL: 0.4 mg/dL (ref 0.2–1.2)
BUN: 16 mg/dL (ref 6–23)
CALCIUM: 9.2 mg/dL (ref 8.4–10.5)
CO2: 27 mEq/L (ref 19–32)
Chloride: 104 mEq/L (ref 96–112)
Creat: 0.97 mg/dL (ref 0.50–1.35)
Glucose, Bld: 94 mg/dL (ref 70–99)
Potassium: 4 mEq/L (ref 3.5–5.3)
Sodium: 138 mEq/L (ref 135–145)
TOTAL PROTEIN: 6.5 g/dL (ref 6.0–8.3)

## 2014-11-11 LAB — CBC WITH DIFFERENTIAL/PLATELET
BASOS PCT: 1 % (ref 0–1)
Basophils Absolute: 0.1 10*3/uL (ref 0.0–0.1)
Eosinophils Absolute: 0.1 10*3/uL (ref 0.0–0.7)
Eosinophils Relative: 2 % (ref 0–5)
HEMATOCRIT: 43 % (ref 39.0–52.0)
Hemoglobin: 14.6 g/dL (ref 13.0–17.0)
Lymphocytes Relative: 19 % (ref 12–46)
Lymphs Abs: 1.1 10*3/uL (ref 0.7–4.0)
MCH: 34.4 pg — AB (ref 26.0–34.0)
MCHC: 34 g/dL (ref 30.0–36.0)
MCV: 101.2 fL — ABNORMAL HIGH (ref 78.0–100.0)
MONO ABS: 0.4 10*3/uL (ref 0.1–1.0)
MPV: 11 fL (ref 8.6–12.4)
Monocytes Relative: 6 % (ref 3–12)
NEUTROS ABS: 4.2 10*3/uL (ref 1.7–7.7)
Neutrophils Relative %: 72 % (ref 43–77)
PLATELETS: 163 10*3/uL (ref 150–400)
RBC: 4.25 MIL/uL (ref 4.22–5.81)
RDW: 14.5 % (ref 11.5–15.5)
WBC: 5.9 10*3/uL (ref 4.0–10.5)

## 2014-11-11 LAB — LIPID PANEL
CHOLESTEROL: 134 mg/dL (ref 0–200)
HDL: 33 mg/dL — ABNORMAL LOW (ref >=40)
LDL CALC: 73 mg/dL (ref 0–99)
Total CHOL/HDL Ratio: 4.1 Ratio
Triglycerides: 142 mg/dL (ref <150)
VLDL: 28 mg/dL (ref 0–40)

## 2014-11-11 NOTE — Progress Notes (Signed)
[name] is here for his month 4 Reprieve study visit. He denies any new problems or concerns. He has started on testosterone injections this week. He continues to c/o foot pain bilat. His pill counts are as expected and his adherence is excellent. He will return in 4 months for the next study visit.

## 2014-11-12 LAB — HIV-1 RNA QUANT-NO REFLEX-BLD

## 2014-11-12 LAB — T-HELPER CELL (CD4) - (RCID CLINIC ONLY)
CD4 T CELL HELPER: 21 % — AB (ref 33–55)
CD4 T Cell Abs: 250 /uL — ABNORMAL LOW (ref 400–2700)

## 2014-11-12 LAB — RPR

## 2014-11-12 LAB — URINE CYTOLOGY ANCILLARY ONLY
CHLAMYDIA, DNA PROBE: NEGATIVE
Neisseria Gonorrhea: NEGATIVE

## 2014-11-12 LAB — HEPATITIS C ANTIBODY: HCV AB: NEGATIVE

## 2014-11-19 ENCOUNTER — Ambulatory Visit: Payer: 59 | Admitting: Infectious Disease

## 2014-11-26 ENCOUNTER — Ambulatory Visit: Payer: 59 | Admitting: Infectious Disease

## 2014-12-01 ENCOUNTER — Other Ambulatory Visit: Payer: Self-pay | Admitting: Internal Medicine

## 2014-12-03 ENCOUNTER — Other Ambulatory Visit: Payer: Self-pay

## 2014-12-03 MED ORDER — ALPRAZOLAM 0.25 MG PO TABS: 0.2500 mg | ORAL_TABLET | Freq: Two times a day (BID) | ORAL | Status: DC | PRN

## 2014-12-09 ENCOUNTER — Encounter: Payer: Self-pay | Admitting: Infectious Disease

## 2014-12-09 ENCOUNTER — Ambulatory Visit (INDEPENDENT_AMBULATORY_CARE_PROVIDER_SITE_OTHER): Payer: 59 | Admitting: Infectious Disease

## 2014-12-09 VITALS — BP 110/73 | HR 64 | Temp 97.9°F | Ht 69.0 in | Wt 184.5 lb

## 2014-12-09 DIAGNOSIS — D099 Carcinoma in situ, unspecified: Secondary | ICD-10-CM

## 2014-12-09 DIAGNOSIS — E291 Testicular hypofunction: Secondary | ICD-10-CM

## 2014-12-09 DIAGNOSIS — Z23 Encounter for immunization: Secondary | ICD-10-CM | POA: Diagnosis not present

## 2014-12-09 DIAGNOSIS — B20 Human immunodeficiency virus [HIV] disease: Secondary | ICD-10-CM | POA: Diagnosis not present

## 2014-12-09 DIAGNOSIS — C8590 Non-Hodgkin lymphoma, unspecified, unspecified site: Secondary | ICD-10-CM

## 2014-12-09 NOTE — Progress Notes (Signed)
  Subjective:    Patient ID: [name], male    DOB: [birth year], 49 y.o.   MRN: [id number]  HPI   [name] is a 49 y.o. male who is  doing superbly well on his  antiviral regimen, currently changed to  once daily TRIUMEQ. He is here to discuss his low serum testosterone.  Serum testosterone was slightly low, his Sex binding globulin and free testosterone were normal.  He started him on intermuscular testosterone replacement and he is injecting into his thigh.   Lab Results  Component Value Date   HIV1RNAQUANT <20 11/11/2014   Lab Results  Component Value Date   CD4TABS 250* 11/11/2014   CD4TABS 249 07/18/2014   CD4TABS 220* 05/09/2014     Review of Systems  Constitutional: Negative for fever, chills, diaphoresis, activity change, appetite change, fatigue and unexpected weight change.  HENT: Negative for rhinorrhea, sinus pressure, sneezing and sore throat.   Eyes: Negative for photophobia and visual disturbance.  Respiratory: Negative for chest tightness and wheezing.   Cardiovascular: Negative for chest pain, palpitations and leg swelling.  Gastrointestinal: Positive for rectal pain. Negative for nausea, constipation, blood in stool, abdominal distention and anal bleeding.  Genitourinary: Negative for dysuria, hematuria, flank pain and difficulty urinating.  Musculoskeletal: Negative for myalgias, back pain, joint swelling, arthralgias and gait problem.  Skin: Negative for color change, pallor, rash and wound.  Neurological: Negative for dizziness, tremors, weakness and light-headedness.  Hematological: Negative for adenopathy. Does not bruise/bleed easily.  Psychiatric/Behavioral: Negative for behavioral problems, confusion, sleep disturbance, dysphoric mood, decreased concentration and agitation.       Objective:   Physical Exam  Constitutional: He is oriented to person, place, and time. He appears well-developed and well-nourished.  HENT:  Head:  Normocephalic and atraumatic.  Eyes: Conjunctivae and EOM are normal.  Neck: Normal range of motion. Neck supple.  Cardiovascular: Normal rate and regular rhythm.   Pulmonary/Chest: Effort normal. No respiratory distress. He has no wheezes.  Abdominal: Soft. He exhibits no distension.  Musculoskeletal: Normal range of motion. He exhibits no edema or tenderness.  Neurological: He is alert and oriented to person, place, and time.  Skin: Skin is warm and dry. No rash noted. No erythema. No pallor.  Psychiatric: He has a normal mood and affect. His behavior is normal. Judgment and thought content normal.          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 Surgery  Anal fissure: to see [facility]  Depression continue Celexa.  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

## 2014-12-23 ENCOUNTER — Encounter: Payer: Self-pay | Admitting: Internal Medicine

## 2014-12-23 ENCOUNTER — Ambulatory Visit (INDEPENDENT_AMBULATORY_CARE_PROVIDER_SITE_OTHER): Payer: 59 | Admitting: Internal Medicine

## 2014-12-23 VITALS — BP 132/84 | HR 80 | Temp 98.4°F | Ht 69.0 in | Wt 177.2 lb

## 2014-12-23 DIAGNOSIS — F329 Major depressive disorder, single episode, unspecified: Secondary | ICD-10-CM

## 2014-12-23 DIAGNOSIS — Z23 Encounter for immunization: Secondary | ICD-10-CM | POA: Diagnosis not present

## 2014-12-23 DIAGNOSIS — Z0000 Encounter for general adult medical examination without abnormal findings: Secondary | ICD-10-CM

## 2014-12-23 DIAGNOSIS — L918 Other hypertrophic disorders of the skin: Secondary | ICD-10-CM

## 2014-12-23 DIAGNOSIS — F32A Depression, unspecified: Secondary | ICD-10-CM

## 2014-12-23 MED ORDER — CITALOPRAM HYDROBROMIDE 40 MG PO TABS: 20.0000 mg | ORAL_TABLET | Freq: Every day | ORAL | Status: DC

## 2014-12-23 NOTE — Patient Instructions (Addendum)
It was good to see you today.  We have reviewed your prior records including labs and tests today  Health Maintenance reviewed - Prevnar 13 immunization updated today. All other recommended immunizations and age-appropriate screenings are up-to-date.  Medications reviewed and updated Wean off generic Celexa as discussed. Please call if increased anxiety symptoms or other problems during the next 30 days; no other changes recommended at this time.  We'll schedule follow-up with my colleague for removal of skin tag as requested  Please schedule followup in 12 months for annual exam and labs, call sooner if problems.  Health Maintenance A healthy lifestyle and preventative care can promote health and wellness.  Maintain regular health, dental, and eye exams.  Eat a healthy diet. Foods like vegetables, fruits, whole grains, low-fat dairy products, and lean protein foods contain the nutrients you need and are low in calories. Decrease your intake of foods high in solid fats, added sugars, and salt. Get information about a proper diet from your health care provider, if necessary.  Regular physical exercise is one of the most important things you can do for your health. Most adults should get at least 150 minutes of moderate-intensity exercise (any activity that increases your heart rate and causes you to sweat) each week. In addition, most adults need muscle-strengthening exercises on 2 or more days a week.   Maintain a healthy weight. The body mass index (BMI) is a screening tool to identify possible weight problems. It provides an estimate of body fat based on height and weight. Your health care provider can find your BMI and can help you achieve or maintain a healthy weight. For males 20 years and older:  A BMI below 18.5 is considered underweight.  A BMI of 18.5 to 24.9 is normal.  A BMI of 25 to 29.9 is considered overweight.  A BMI of 30 and above is considered obese.  Maintain  normal blood lipids and cholesterol by exercising and minimizing your intake of saturated fat. Eat a balanced diet with plenty of fruits and vegetables. Blood tests for lipids and cholesterol should begin at age 70 and be repeated every 5 years. If your lipid or cholesterol levels are high, you are over age 38, or you are at high risk for heart disease, you may need your cholesterol levels checked more frequently.Ongoing high lipid and cholesterol levels should be treated with medicines if diet and exercise are not working.  If you smoke, find out from your health care provider how to quit. If you do not use tobacco, do not start.  Lung cancer screening is recommended for adults aged 48-80 years who are at high risk for developing lung cancer because of a history of smoking. A yearly low-dose CT scan of the lungs is recommended for people who have at least a 30-pack-year history of smoking and are current smokers or have quit within the past 15 years. A pack year of smoking is smoking an average of 1 pack of cigarettes a day for 1 year (for example, a 30-pack-year history of smoking could mean smoking 1 pack a day for 30 years or 2 packs a day for 15 years). Yearly screening should continue until the smoker has stopped smoking for at least 15 years. Yearly screening should be stopped for people who develop a health problem that would prevent them from having lung cancer treatment.  If you choose to drink alcohol, do not have more than 2 drinks per day. One drink is considered to  be 12 oz (360 mL) of beer, 5 oz (150 mL) of wine, or 1.5 oz (45 mL) of liquor.  Avoid the use of street drugs. Do not share needles with anyone. Ask for help if you need support or instructions about stopping the use of drugs.  High blood pressure causes heart disease and increases the risk of stroke. Blood pressure should be checked at least every 1-2 years. Ongoing high blood pressure should be treated with medicines if weight  loss and exercise are not effective.  If you are 10-57 years old, ask your health care provider if you should take aspirin to prevent heart disease.  Diabetes screening involves taking a blood sample to check your fasting blood sugar level. This should be done once every 3 years after age 33 if you are at a normal weight and without risk factors for diabetes. Testing should be considered at a younger age or be carried out more frequently if you are overweight and have at least 1 risk factor for diabetes.  Colorectal cancer can be detected and often prevented. Most routine colorectal cancer screening begins at the age of 35 and continues through age 97. However, your health care provider may recommend screening at an earlier age if you have risk factors for colon cancer. On a yearly basis, your health care provider may provide home test kits to check for hidden blood in the stool. A small camera at the end of a tube may be used to directly examine the colon (sigmoidoscopy or colonoscopy) to detect the earliest forms of colorectal cancer. Talk to your health care provider about this at age 4 when routine screening begins. A direct exam of the colon should be repeated every 5-10 years through age 44, unless early forms of precancerous polyps or small growths are found.  People who are at an increased risk for hepatitis B should be screened for this virus. You are considered at high risk for hepatitis B if:  You were born in a country where hepatitis B occurs often. Talk with your health care provider about which countries are considered high risk.  Your parents were born in a high-risk country and you have not received a shot to protect against hepatitis B (hepatitis B vaccine).  You have HIV or AIDS.  You use needles to inject street drugs.  You live with, or have sex with, someone who has hepatitis B.  You are a man who has sex with other men (MSM).  You get hemodialysis treatment.  You take  certain medicines for conditions like cancer, organ transplantation, and autoimmune conditions.  Hepatitis C blood testing is recommended for all people born from [birth year] through 1965 and any individual with known risk factors for hepatitis C.  Healthy men should no longer receive prostate-specific antigen (PSA) blood tests as part of routine cancer screening. Talk to your health care provider about prostate cancer screening.  Testicular cancer screening is not recommended for adolescents or adult males who have no symptoms. Screening includes self-exam, a health care provider exam, and other screening tests. Consult with your health care provider about any symptoms you have or any concerns you have about testicular cancer.  Practice safe sex. Use condoms and avoid high-risk sexual practices to reduce the spread of sexually transmitted infections (STIs).  You should be screened for STIs, including gonorrhea and chlamydia if:  You are sexually active and are younger than 24 years.  You are older than 24 years, and your health care  provider tells you that you are at risk for this type of infection.  Your sexual activity has changed since you were last screened, and you are at an increased risk for chlamydia or gonorrhea. Ask your health care provider if you are at risk.  If you are at risk of being infected with HIV, it is recommended that you take a prescription medicine daily to prevent HIV infection. This is called pre-exposure prophylaxis (PrEP). You are considered at risk if:  You are a man who has sex with other men (MSM).  You are a heterosexual man who is sexually active with multiple partners.  You take drugs by injection.  You are sexually active with a partner who has HIV.  Talk with your health care provider about whether you are at high risk of being infected with HIV. If you choose to begin PrEP, you should first be tested for HIV. You should then be tested every 3 months for as  long as you are taking PrEP.  Use sunscreen. Apply sunscreen liberally and repeatedly throughout the day. You should seek shade when your shadow is shorter than you. Protect yourself by wearing long sleeves, pants, a wide-brimmed hat, and sunglasses year round whenever you are outdoors.  Tell your health care provider of new moles or changes in moles, especially if there is a change in shape or color. Also, tell your health care provider if a mole is larger than the size of a pencil eraser.  A one-time screening for abdominal aortic aneurysm (AAA) and surgical repair of large AAAs by ultrasound is recommended for men aged 71-75 years who are current or former smokers.  Stay current with your vaccines (immunizations). Document Released: 11/19/2007 Document Revised: 05/28/2013 Document Reviewed: 10/18/2010 [facility] Patient Information 2015 [place], [place]. This information is not intended to replace advice given to you by your health care provider. Make sure you discuss any questions you have with your health care provider.

## 2014-12-23 NOTE — Progress Notes (Signed)
Pre visit review using our clinic review tool, if applicable. No additional management support is needed unless otherwise documented below in the visit note. 

## 2014-12-23 NOTE — Progress Notes (Signed)
Subjective:    Patient ID: [name], male    DOB: March 09, 1966, 49 y.o.   MRN: [id number]  HPI  patient is here today for annual physical. Patient feels well and has no complaints. Also reviewed chronic medical conditions, interval events and current concerns  Past Medical History  Diagnosis Date  . ALLERGIC RHINITIS   . Anal fissure     Chronic, recurrent anal bleeding with pain  . Depression   . HIV INFECTION 07/05/2010 dx  . NEUTROPENIA, DRUG-INDUCED     on chemo early 2012  . 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 Thrombocytopenia     resolved  . Toe fracture, right 10/04/2012    great toe, nonsurgical mgmt Sharol Given)  . Hypogonadism male 10/2014 dx    self admin IM replacement   Family History  Problem Relation Age of Onset  . Alcohol abuse Mother   . Cancer Father 66    unknown primary  . Hypertension Mother   . Hyperlipidemia Brother   . Alcohol abuse Father   . Cirrhosis Father     alcohol  . Macular degeneration Brother     histoplasm  . Hypertension Sister 52    carotid blockage   History  Substance Use Topics  . Smoking status: Former Smoker    Quit date: 01/04/2005  . Smokeless tobacco: Not on file  . Alcohol Use: 0.0 oz/week    0 Standard drinks or equivalent per week     Comment: occasional, socially   Review of Systems  Constitutional: Negative for fever, activity change, appetite change, fatigue and unexpected weight change.  Respiratory: Negative for cough, chest tightness, shortness of breath and wheezing.   Cardiovascular: Negative for chest pain, palpitations and leg swelling.  Neurological: Negative for dizziness, weakness and headaches.  Psychiatric/Behavioral: Negative for dysphoric mood. The patient is not nervous/anxious.   All other  systems reviewed and are negative.      Objective:    Physical Exam  Constitutional: He is oriented to person, place, and time. He appears well-developed and well-nourished. No distress.  HENT:  Head: Normocephalic and atraumatic.  Nose: Nose normal.  Mouth/Throat: Oropharynx is clear and moist.  Hearing grossly normal.  Eyes: Conjunctivae and EOM are normal. Pupils are equal, round, and reactive to light. No scleral icterus.  Neck: Normal range of motion. Neck supple. No JVD present. No thyromegaly present.  Cardiovascular: Normal rate, regular rhythm, normal heart sounds and intact distal pulses.  Exam reveals no friction rub.   No murmur heard. No edema.  Pulmonary/Chest: Effort normal and breath sounds normal. No respiratory distress. He has no wheezes.  Abdominal: Soft. Bowel sounds are normal. He exhibits no distension and no mass. There is no tenderness. There is no guarding.  Genitourinary:  defer  Musculoskeletal: Normal range of motion. He exhibits no edema or tenderness.  Lymphadenopathy:    He has no cervical adenopathy.  Neurological: He is alert and oriented to person, place, and time. He has normal reflexes. No cranial nerve deficit.  Skin: Skin is warm and dry. No rash noted. No erythema.  2 small skin tags right axilla, no inflammation  Psychiatric: He has a normal mood and affect. His behavior is normal. Thought content normal.    BP 132/84 mmHg  Pulse 80  Temp(Src) 98.4 F (36.9 C) (Oral)  Ht 5\' 9"  (1.753 m)  Wt 177 lb 4 oz (80.4 kg)  BMI 26.16 kg/m2  SpO2 98% Wt Readings from Last 3 Encounters:  12/23/14 177 lb 4 oz (80.4 kg)  12/09/14 184 lb 8 oz (83.689 kg)  11/11/14 183 lb 4 oz (83.122 kg)    Lab Results  Component Value Date   WBC 5.9 11/11/2014   HGB 14.6 11/11/2014   HCT 43.0 11/11/2014   PLT 163 11/11/2014   GLUCOSE 94 11/11/2014   CHOL 134 11/11/2014   TRIG 142 11/11/2014   HDL 33* 11/11/2014   LDLCALC 73 11/11/2014   ALT 32  11/11/2014   AST 25 11/11/2014   NA 138 11/11/2014   K 4.0 11/11/2014   CL 104 11/11/2014   CREATININE 0.97 11/11/2014   BUN 16 11/11/2014   CO2 27 11/11/2014   TSH 1.750 10/28/2014   PSA 1.37 12/19/2013   INR 0.96 03/21/2011   MICROALBUR 0.95 10/09/2013    Ct Coronary Morp W/cta Cor W/score W/ca W/cm &/or Wo/cm  07/18/2014   EXAM: OVER-READ INTERPRETATION  CT CHEST  The following report is an over-read performed by radiologist Dr. Lindaann Slough [facility] on 07/18/2014. This over-read does not include interpretation of cardiac or coronary anatomy or pathology. The coronary calcium score/coronary CTA interpretation by the cardiologist is attached.  COMPARISON:  Chest CT 05/09/2012.  FINDINGS: The visualized mediastinum appears stable without evidence of adenopathy or other mass. There is atherosclerosis of the brachiocephalic artery. No pleural or pericardial effusion demonstrated. There is stable chronic lung disease with mild emphysema and scattered scarring, greatest in the right upper lobe. No suspicious pulmonary nodules identified. Images through the upper abdomen demonstrate probable hepatic steatosis.  IMPRESSION: No acute or significant extracardiac findings demonstrated within the chest.   Electronically Signed   By: Richardean Sale M.D.   On: 07/18/2014 12:06       Assessment & Plan:   CPX/z00.00 - Patient has been counseled on age-appropriate routine health concerns for screening and prevention. These are reviewed and up-to-date. Immunizations are up-to-date or declined. Labs reviewed. Prevnar 13 updated today  We will schedule with my partner for removal of right axillary skin tags per request  Problem List Items Addressed This Visit    Depression    Eventually wishes to lessen "dependancy" on BZs -  Reduced BZ dose 06/2012 - continue prn use and started amitriptyline qhs 06/2012 - sleep improved, only taking prn Doing generally well, no anxiety or depression  flares Per discussion, will wean SSRI over next 30 days. Patient to call if increasing symptoms, increasing Xanax use or other problems during wean from SSRI      Relevant Medications   citalopram (CELEXA) 40 MG tablet    Other Visit Diagnoses    Routine general medical examination at a health care facility    -  Primary    Skin tag        Need for prophylactic vaccination against Streptococcus pneumoniae (pneumococcus)        Relevant Orders    Pneumococcal conjugate vaccine 13-valent (Completed)        Gwendolyn Grant, MD

## 2014-12-23 NOTE — Assessment & Plan Note (Signed)
Eventually wishes to lessen "dependancy" on BZs -  Reduced BZ dose 06/2012 - continue prn use and started amitriptyline qhs 06/2012 - sleep improved, only taking prn Doing generally well, no anxiety or depression flares Per discussion, will wean SSRI over next 30 days. Patient to call if increasing symptoms, increasing Xanax use or other problems during wean from SSRI

## 2015-01-01 ENCOUNTER — Telehealth: Payer: Self-pay | Admitting: Internal Medicine

## 2015-01-01 NOTE — Addendum Note (Signed)
Addended by: Lowella Dandy on: 01/01/2015 09:13 AM   Modules accepted: Orders, SmartSet

## 2015-01-01 NOTE — Telephone Encounter (Signed)
Noted thanks °

## 2015-01-01 NOTE — Telephone Encounter (Signed)
Patient returned your call.

## 2015-01-01 NOTE — Telephone Encounter (Signed)
Pt informed that dermatology referral was entered and that he would be contacted asap with an appt.

## 2015-01-08 ENCOUNTER — Ambulatory Visit: Payer: 59 | Admitting: Internal Medicine

## 2015-01-21 ENCOUNTER — Encounter: Payer: Self-pay | Admitting: Infectious Disease

## 2015-02-10 ENCOUNTER — Encounter: Payer: Self-pay | Admitting: Infectious Disease

## 2015-02-10 ENCOUNTER — Other Ambulatory Visit: Payer: Self-pay | Admitting: Infectious Disease

## 2015-02-10 DIAGNOSIS — E291 Testicular hypofunction: Secondary | ICD-10-CM

## 2015-02-11 ENCOUNTER — Other Ambulatory Visit: Payer: 59

## 2015-02-11 DIAGNOSIS — E291 Testicular hypofunction: Secondary | ICD-10-CM

## 2015-02-11 DIAGNOSIS — B20 Human immunodeficiency virus [HIV] disease: Secondary | ICD-10-CM

## 2015-02-11 LAB — COMPLETE METABOLIC PANEL WITH GFR
ALT: 20 U/L (ref 9–46)
AST: 25 U/L (ref 10–40)
Albumin: 4.3 g/dL (ref 3.6–5.1)
Alkaline Phosphatase: 74 U/L (ref 40–115)
BUN: 11 mg/dL (ref 7–25)
CALCIUM: 9.4 mg/dL (ref 8.6–10.3)
CHLORIDE: 106 mmol/L (ref 98–110)
CO2: 28 mmol/L (ref 20–31)
Creat: 0.95 mg/dL (ref 0.60–1.35)
Glucose, Bld: 93 mg/dL (ref 65–99)
POTASSIUM: 4.5 mmol/L (ref 3.5–5.3)
Sodium: 143 mmol/L (ref 135–146)
Total Bilirubin: 0.5 mg/dL (ref 0.2–1.2)
Total Protein: 6.6 g/dL (ref 6.1–8.1)

## 2015-02-11 LAB — CBC WITH DIFFERENTIAL/PLATELET
BASOS PCT: 0 % (ref 0–1)
Basophils Absolute: 0 10*3/uL (ref 0.0–0.1)
Eosinophils Absolute: 0.1 10*3/uL (ref 0.0–0.7)
Eosinophils Relative: 1 % (ref 0–5)
HCT: 51.7 % (ref 39.0–52.0)
HEMOGLOBIN: 17.4 g/dL — AB (ref 13.0–17.0)
Lymphocytes Relative: 10 % — ABNORMAL LOW (ref 12–46)
Lymphs Abs: 1.2 10*3/uL (ref 0.7–4.0)
MCH: 34.5 pg — ABNORMAL HIGH (ref 26.0–34.0)
MCHC: 33.7 g/dL (ref 30.0–36.0)
MCV: 102.6 fL — ABNORMAL HIGH (ref 78.0–100.0)
MONO ABS: 1 10*3/uL (ref 0.1–1.0)
MPV: 11.8 fL (ref 8.6–12.4)
Monocytes Relative: 8 % (ref 3–12)
NEUTROS ABS: 10 10*3/uL — AB (ref 1.7–7.7)
Neutrophils Relative %: 81 % — ABNORMAL HIGH (ref 43–77)
Platelets: 208 10*3/uL (ref 150–400)
RBC: 5.04 MIL/uL (ref 4.22–5.81)
RDW: 13.7 % (ref 11.5–15.5)
WBC: 12.3 10*3/uL — AB (ref 4.0–10.5)

## 2015-02-11 LAB — LIPID PANEL
CHOL/HDL RATIO: 3.6 ratio (ref <=5.0)
CHOLESTEROL: 103 mg/dL — AB (ref 125–200)
HDL: 29 mg/dL — AB (ref >=40)
LDL Cholesterol: 56 mg/dL (ref <130)
TRIGLYCERIDES: 91 mg/dL (ref <150)
VLDL: 18 mg/dL (ref <30)

## 2015-02-12 LAB — MICROALBUMIN / CREATININE URINE RATIO
Creatinine, Urine: 386.3 mg/dL
MICROALB/CREAT RATIO: 2.8 mg/g (ref 0.0–30.0)
Microalb, Ur: 1.1 mg/dL (ref <2.0)

## 2015-02-12 LAB — URINE CYTOLOGY ANCILLARY ONLY
CHLAMYDIA, DNA PROBE: NEGATIVE
NEISSERIA GONORRHEA: NEGATIVE

## 2015-02-12 LAB — TESTOSTERONE: Testosterone: 555 ng/dL (ref 300–890)

## 2015-02-12 LAB — T-HELPER CELL (CD4) - (RCID CLINIC ONLY)
CD4 % Helper T Cell: 18 % — ABNORMAL LOW (ref 33–55)
CD4 T Cell Abs: 1420 /uL (ref 400–2700)

## 2015-02-12 LAB — RPR

## 2015-02-15 LAB — HIV-1 RNA QUANT-NO REFLEX-BLD
HIV 1 RNA Quant: 30 copies/mL — ABNORMAL HIGH (ref <20)
HIV-1 RNA Quant, Log: 1.48 {Log} — ABNORMAL HIGH (ref <1.30)

## 2015-02-17 ENCOUNTER — Encounter: Payer: Self-pay | Admitting: Infectious Disease

## 2015-02-23 ENCOUNTER — Encounter: Payer: Self-pay | Admitting: Infectious Disease

## 2015-02-24 ENCOUNTER — Other Ambulatory Visit: Payer: Self-pay | Admitting: *Deleted

## 2015-02-24 DIAGNOSIS — B20 Human immunodeficiency virus [HIV] disease: Secondary | ICD-10-CM

## 2015-02-25 ENCOUNTER — Other Ambulatory Visit: Payer: 59

## 2015-02-25 DIAGNOSIS — B20 Human immunodeficiency virus [HIV] disease: Secondary | ICD-10-CM

## 2015-02-26 LAB — T-HELPER CELL (CD4) - (RCID CLINIC ONLY)
CD4 % Helper T Cell: 24 % — ABNORMAL LOW (ref 33–55)
CD4 T CELL ABS: 410 /uL (ref 400–2700)

## 2015-03-02 ENCOUNTER — Encounter: Payer: Self-pay | Admitting: Infectious Disease

## 2015-03-03 NOTE — Telephone Encounter (Signed)
Message sent to Dr Tommy Medal.

## 2015-03-05 ENCOUNTER — Other Ambulatory Visit: Payer: Self-pay

## 2015-03-05 MED ORDER — ALPRAZOLAM 0.25 MG PO TABS: 0.2500 mg | ORAL_TABLET | Freq: Two times a day (BID) | ORAL | Status: DC | PRN

## 2015-03-09 ENCOUNTER — Other Ambulatory Visit: Payer: Self-pay | Admitting: *Deleted

## 2015-03-09 MED ORDER — TESTOSTERONE CYPIONATE 100 MG/ML IM SOLN: 100.0000 mg | INTRAMUSCULAR | Status: DC

## 2015-03-24 ENCOUNTER — Encounter: Payer: Self-pay | Admitting: Infectious Disease

## 2015-03-24 NOTE — Telephone Encounter (Signed)
RN spoke with the pt at [name] when he walked in to the Center.  He had unprotected sex about 3 weeks ago and is concerned about STI exposure.  He is requesting STI testing.  Dr. Tommy Medal please respond.  The patient would like a call regarding your decision.  Thank you

## 2015-03-26 ENCOUNTER — Encounter (INDEPENDENT_AMBULATORY_CARE_PROVIDER_SITE_OTHER): Payer: 59 | Admitting: *Deleted

## 2015-03-26 VITALS — BP 129/84 | HR 69 | Temp 98.7°F | Resp 16 | Wt 164.5 lb

## 2015-03-26 DIAGNOSIS — Z23 Encounter for immunization: Secondary | ICD-10-CM

## 2015-03-26 DIAGNOSIS — Z006 Encounter for examination for normal comparison and control in clinical research program: Secondary | ICD-10-CM

## 2015-03-26 NOTE — Progress Notes (Signed)
[name] is here for his month 8 visit for Reprieve, A Randomized Trial to Prevent Vascular Events in HIV (study drug is Pitavastatin 4mg  or placebo). He denies any new problems or concerns. He says he remembers missing only 1 dose of his study drug over the past 4 months and his pill counts are accurate. He has lost almost 20 lbs since the last visit and is intentionally dieting and working out. He will return in 4 months for the next visit.

## 2015-04-01 ENCOUNTER — Telehealth: Payer: Self-pay | Admitting: *Deleted

## 2015-04-01 DIAGNOSIS — Z113 Encounter for screening for infections with a predominantly sexual mode of transmission: Secondary | ICD-10-CM

## 2015-04-01 NOTE — Telephone Encounter (Signed)
Per Dr. Tommy Medal OK to have patient come in for STI testing.

## 2015-04-02 ENCOUNTER — Other Ambulatory Visit: Payer: 59

## 2015-04-02 ENCOUNTER — Telehealth: Payer: Self-pay

## 2015-04-02 DIAGNOSIS — Z113 Encounter for screening for infections with a predominantly sexual mode of transmission: Secondary | ICD-10-CM

## 2015-04-02 DIAGNOSIS — K640 First degree hemorrhoids: Secondary | ICD-10-CM

## 2015-04-02 DIAGNOSIS — Z202 Contact with and (suspected) exposure to infections with a predominantly sexual mode of transmission: Secondary | ICD-10-CM

## 2015-04-02 MED ORDER — HYDROCORTISONE 2.5 % RE CREA: 1.0000 "application " | TOPICAL_CREAM | Freq: Two times a day (BID) | RECTAL | Status: DC

## 2015-04-02 NOTE — Progress Notes (Unsigned)
Rectal and oral swabs obtained for testing.

## 2015-04-02 NOTE — Telephone Encounter (Signed)
Rectal and oral swabs obtained for  STI  testing.  Pt requested I take a look at hemorrhoid that has been bothersome and not relieved  with preparation H.  Small 2-3 cm hemorrhoid present without bleeding or irration.   I will inform Dr Tommy Medal at patient request.   Per Dr Tommy Medal if it is itching Anusol HC may help if it is becoming painful or an annoyance he should seek a surgical consult.  Verbal order for Anusol [facility] apply twice daily  30  gram tube with 2 refills. Read back and verified.   Patient informed.   Laverle Patter, RN

## 2015-04-03 LAB — RPR

## 2015-04-06 LAB — URINE CYTOLOGY ANCILLARY ONLY
Chlamydia: NEGATIVE
NEISSERIA GONORRHEA: NEGATIVE

## 2015-04-06 LAB — CYTOLOGY, (ORAL, ANAL, URETHRAL) ANCILLARY ONLY
CHLAMYDIA, DNA PROBE: POSITIVE — AB
Chlamydia: NEGATIVE
NEISSERIA GONORRHEA: NEGATIVE
Neisseria Gonorrhea: NEGATIVE

## 2015-04-07 ENCOUNTER — Encounter: Payer: Self-pay | Admitting: Infectious Disease

## 2015-04-07 DIAGNOSIS — A749 Chlamydial infection, unspecified: Secondary | ICD-10-CM

## 2015-04-07 MED ORDER — AZITHROMYCIN 500 MG PO TABS: 1000.0000 mg | ORAL_TABLET | Freq: Once | ORAL | Status: DC

## 2015-04-07 NOTE — Telephone Encounter (Signed)
Results relayed to patient.  Prescription sent to pharmacy of his choice. He will pick up the 1 gm azithromycin today and take it.  He will notify his partners, will come back 11/29 for retest. Landis Gandy, RN

## 2015-04-07 NOTE — Telephone Encounter (Signed)
-----   Message from Truman Hayward, MD sent at 04/06/2015  5:26 PM EDT ----- [name] needs 1 gram of azithromycin to treat his chlamydia and then make sure partners treated and then back for test of cure

## 2015-04-08 ENCOUNTER — Encounter: Payer: Self-pay | Admitting: Infectious Disease

## 2015-04-24 ENCOUNTER — Other Ambulatory Visit: Payer: Self-pay | Admitting: *Deleted

## 2015-04-24 ENCOUNTER — Encounter: Payer: Self-pay | Admitting: Infectious Disease

## 2015-04-24 DIAGNOSIS — B20 Human immunodeficiency virus [HIV] disease: Secondary | ICD-10-CM

## 2015-04-24 MED ORDER — ABACAVIR-DOLUTEGRAVIR-LAMIVUD 600-50-300 MG PO TABS: 1.0000 | ORAL_TABLET | Freq: Every day | ORAL | Status: DC

## 2015-05-04 ENCOUNTER — Other Ambulatory Visit: Payer: Self-pay | Admitting: *Deleted

## 2015-05-04 DIAGNOSIS — B20 Human immunodeficiency virus [HIV] disease: Secondary | ICD-10-CM

## 2015-05-04 MED ORDER — ABACAVIR-DOLUTEGRAVIR-LAMIVUD 600-50-300 MG PO TABS: 1.0000 | ORAL_TABLET | Freq: Every day | ORAL | Status: DC

## 2015-05-05 ENCOUNTER — Other Ambulatory Visit: Payer: Self-pay | Admitting: *Deleted

## 2015-05-05 ENCOUNTER — Other Ambulatory Visit: Payer: 59

## 2015-05-05 DIAGNOSIS — Z113 Encounter for screening for infections with a predominantly sexual mode of transmission: Secondary | ICD-10-CM

## 2015-05-05 DIAGNOSIS — B20 Human immunodeficiency virus [HIV] disease: Secondary | ICD-10-CM

## 2015-05-07 ENCOUNTER — Encounter: Payer: Self-pay | Admitting: Infectious Disease

## 2015-05-07 LAB — CYTOLOGY, (ORAL, ANAL, URETHRAL) ANCILLARY ONLY
CHLAMYDIA, DNA PROBE: NEGATIVE
NEISSERIA GONORRHEA: NEGATIVE

## 2015-05-10 ENCOUNTER — Encounter: Payer: Self-pay | Admitting: Infectious Disease

## 2015-05-11 ENCOUNTER — Encounter: Payer: Self-pay | Admitting: Infectious Disease

## 2015-05-30 ENCOUNTER — Encounter: Payer: Self-pay | Admitting: Infectious Disease

## 2015-06-02 ENCOUNTER — Other Ambulatory Visit: Payer: Self-pay

## 2015-06-02 MED ORDER — TESTOSTERONE CYPIONATE 100 MG/ML IM SOLN: 100.0000 mg | INTRAMUSCULAR | Status: DC

## 2015-06-09 ENCOUNTER — Other Ambulatory Visit: Payer: Self-pay | Admitting: *Deleted

## 2015-06-09 DIAGNOSIS — B20 Human immunodeficiency virus [HIV] disease: Secondary | ICD-10-CM

## 2015-06-09 MED ORDER — TESTOSTERONE CYPIONATE 100 MG/ML IM SOLN: 100.0000 mg | INTRAMUSCULAR | Status: DC

## 2015-06-09 MED ORDER — ABACAVIR-DOLUTEGRAVIR-LAMIVUD 600-50-300 MG PO TABS: 1.0000 | ORAL_TABLET | Freq: Every day | ORAL | Status: DC

## 2015-06-09 NOTE — Telephone Encounter (Signed)
Pharmacy needing diagnosis code for Triumeq, B20.  Patient is going to continue using [facility] for Triumeq rx per the patient.

## 2015-06-17 ENCOUNTER — Encounter (HOSPITAL_COMMUNITY): Payer: Self-pay | Admitting: Emergency Medicine

## 2015-06-17 ENCOUNTER — Emergency Department (INDEPENDENT_AMBULATORY_CARE_PROVIDER_SITE_OTHER)
Admission: EM | Admit: 2015-06-17 | Discharge: 2015-06-17 | Disposition: A | Discharge status: Home / Self Care | Payer: 59 | Source: Home / Self Care | Attending: Emergency Medicine | Admitting: Emergency Medicine

## 2015-06-17 DIAGNOSIS — B86 Scabies: Secondary | ICD-10-CM | POA: Diagnosis not present

## 2015-06-17 DIAGNOSIS — R21 Rash and other nonspecific skin eruption: Secondary | ICD-10-CM | POA: Diagnosis not present

## 2015-06-17 MED ORDER — PERMETHRIN 5 % EX CREA: TOPICAL_CREAM | CUTANEOUS | Status: DC

## 2015-06-17 MED ORDER — PREDNISONE 10 MG PO TABS
ORAL_TABLET | ORAL | Status: DC
Start: 2015-06-17 — End: 2015-08-03

## 2015-06-17 NOTE — Discharge Instructions (Signed)
The rash on your feet is concerning for scabies. The rash on your legs and torso is more likely a systemic reaction. Use the permethrin cream as prescribed. Take the prednisone taper as prescribed. If this does not resolve her symptoms, please follow-up with your dermatologist.   Scabies, Adult Scabies is a skin condition that happens when very small insects get under the skin (infestation). This causes a rash and severe itchiness. Scabies can spread from person to person (is contagious). If you get scabies, it is common for others in your household to get scabies too. With proper treatment, symptoms usually go away in 2-4 weeks. Scabies usually does not cause lasting problems. CAUSES This condition is caused by mites (Sarcoptes scabiei, or human itch mites) that can only be seen with a microscope. The mites get into the top layer of skin and lay eggs. Scabies can spread from person to person through:  Close contact with a person who has scabies.  Contact with infested items, such as towels, bedding, or clothing. RISK FACTORS This condition is more likely to develop in:  People who live in nursing homes and other extended-care facilities.  People who have sexual contact with a partner who has scabies.  Young children who attend child care facilities.  People who care for others who are at increased risk for scabies. SYMPTOMS Symptoms of this condition may include:  Severe itchiness. This is often worse at night.  A rash that includes tiny red bumps or blisters. The rash commonly occurs on the wrist, elbow, armpit, fingers, waist, groin, or buttocks. Bumps may form a line (burrow) in some areas.  Skin irritation. This can include scaly patches or sores. DIAGNOSIS This condition is diagnosed with a physical exam. Your health care provider will look closely at your skin. In some cases, your health care provider may take a sample of your affected skin (skin scraping) and have it  examined under a microscope. TREATMENT This condition may be treated with:  Medicated cream or lotion that kills the mites. This is spread on the entire body and left on for several hours. Usually, one treatment with medicated cream or lotion is enough to kill all of the mites. In severe cases, the treatment may be repeated.  Medicated cream that relieves itching.  Medicines that help to relieve itching.  Medicines that kill the mites. This treatment is rarely used. HOME CARE INSTRUCTIONS Medicines  Take or apply over-the-counter and prescription medicines as told by your health care provider.  Apply medicated cream or lotion as told by your health care provider.  Do not wash off the medicated cream or lotion until the necessary amount of time has passed. Skin Care  Avoid scratching your affected skin.  Keep your fingernails closely trimmed to reduce injury from scratching.  Take cool baths or apply cool washcloths to help reduce itching. General Instructions  Clean all items that you recently had contact with, including bedding, clothing, and furniture. Do this on the same day that your treatment starts.  Use hot water when you wash items.  Place unwashable items into closed, airtight plastic bags for at least 3 days. The mites cannot live for more than 3 days away from human skin.  Vacuum furniture and mattresses that you use.  Make sure that other people who may have been infested are examined by a health care provider. These include members of your household and anyone who may have had contact with infested items.  Keep all follow-up visits  as told by your health care provider. This is important. SEEK MEDICAL CARE IF:  You have itching that does not go away after 4 weeks of treatment.  You continue to develop new bumps or burrows.  You have redness, swelling, or pain in your rash area after treatment.  You have fluid, blood, or pus coming from your rash.   This  information is not intended to replace advice given to you by your health care provider. Make sure you discuss any questions you have with your health care provider.   Document Released: 02/11/2015 Document Reviewed: 12/23/2014 Elsevier Interactive Patient Education [employer].

## 2015-06-17 NOTE — ED Provider Notes (Signed)
CSN: [id number]     Arrival date & time 06/17/15  1927 History   First MD Initiated Contact with Patient 06/17/15 2054     Chief Complaint  Patient presents with  . Rash   (Consider location/radiation/quality/duration/timing/severity/associated sxs/prior Treatment) HPI  He is a 50 year old man here for evaluation of rash. He states he has had this for about 5 weeks now. He states it initially started out as a red wheal on his upper back. That resolved, but he gets new wheels on his arms and legs periodically. They can be somewhat tender or itch. They change on an almost daily basis. He also reports feeling itchy, primarily in intertriginous areas. He also reports itchy bumps between his toes. No fevers or chills. He was treated with a course of prednisone which did help his symptoms, but they recurred once he stopped the medicine.  Past Medical History  Diagnosis Date  . ALLERGIC RHINITIS   . Anal fissure     Chronic, recurrent anal bleeding with pain  . Depression   . HIV INFECTION 07/05/2010 dx  . NEUTROPENIA, DRUG-INDUCED     on chemo early 2012  . 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 Thrombocytopenia ([place])     resolved  . Toe fracture, right 10/04/2012    great toe, nonsurgical mgmt Sharol Given)  . 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 Nasal septum surgery  2006  . Monteggia fracture surgery  2010    L elbow  . Precancer lesion removed from anus  2012  . 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 Lymph node biopsy  1975    L ingunial - nondx   Family History  Problem Relation Age of Onset  . Alcohol abuse Mother   . Cancer Father 67    unknown primary  . Hypertension  Mother   . Hyperlipidemia Brother   . Alcohol abuse Father   . Cirrhosis Father     alcohol  . Macular degeneration Brother     histoplasm  . Hypertension Sister 54    carotid blockage   Social History  Substance Use Topics  . Smoking status: Former Smoker    Quit date: 01/04/2005  . Smokeless tobacco: None  . Alcohol Use: 0.0 oz/week    0 Standard drinks or equivalent per week     Comment: occasional, socially    Review of Systems As in history of present illness Allergies  Sulfonamide derivatives  Home Medications   Prior to Admission medications   Medication Sig Start Date End Date Taking? Authorizing Provider  abacavir-dolutegravir-lamiVUDine (TRIUMEQ) 600-50-300 MG tablet Take 1 tablet by mouth daily. 06/09/15   Truman Hayward, MD  ALPRAZolam Duanne Moron) 0.25 MG tablet Take 1 tablet (0.25 mg total) by mouth 2 (two) times daily as needed for anxiety. 03/05/15   Rowe Clack, MD  amitriptyline (ELAVIL) 10 MG tablet Take 1 tablet (10 mg total) by mouth at bedtime as needed for sleep. 12/01/14   Rowe Clack, MD  azithromycin (ZITHROMAX) 500 MG tablet Take 2 tablets (1,000 mg total) by mouth once. 04/07/15   Truman Hayward, MD  citalopram (CELEXA) 40 MG tablet Take 0.5 tablets (20 mg total) by mouth daily. X 2 weeks, then 20mg  every other day 2 weeks, then stop 12/23/14   Rowe Clack, MD  hydrocortisone (ANUSOL-HC) 2.5 % rectal cream Place 1 application rectally 2 (two) times daily. 04/02/15   Truman Hayward, MD  Multiple Vitamin (ONE-A-DAY MENS PO) Take 1 capsule by mouth daily.    Historical Provider, MD  permethrin (ELIMITE) 5 % cream Apply from neck down.  Leave in place 12 hours, then rinse off.  Repeat in 1 week. 06/17/15   Melony Overly, MD  Pitavastatin Calcium 4 MG TABS Take 1 tablet (4 mg total) by mouth daily. 07/18/14   Truman Hayward, MD  predniSONE (DELTASONE) 10 MG tablet Take 6 tablets on day 1, 5 on day 2, 4 on day 3, 3 on day 4, 2 on  day 5, and 1 on day 6 06/17/15   Melony Overly, MD  testosterone cypionate (DEPOTESTOTERONE CYPIONATE) 100 MG/ML injection Inject 1 mL (100 mg total) into the muscle once a week. For IM use only 06/09/15   Truman Hayward, MD   Meds Ordered and Administered this Visit  Medications - No data to display  BP 122/76 mmHg  Pulse 72  Temp(Src) 97.5 F (36.4 C) (Oral)  Resp 16  SpO2 100% No data found.   Physical Exam  Constitutional: He is oriented to person, place, and time. He appears well-developed and well-nourished. No distress.  Cardiovascular: Normal rate.   Pulmonary/Chest: Effort normal.  Neurological: He is alert and oriented to person, place, and time.  Skin:  He has several large erythematous wheals on his legs. He has smaller lesions on his torso. He has small erythematous papules in a linear distribution on his feet and some around the bellybutton.    ED Course  Procedures (including critical care time)  Labs Review Labs Reviewed - No data to display  Imaging Review No results found.    MDM   1. Rash   2. Scabies    Rash on his feet is concerning for scabies. The rash on his legs is more likely a systemic response. We'll treat with permethrin cream. Handout on scabies given. Rapid prednisone taper for itching and the wheals. If this does not resolve his symptoms, he'll follow-up with his dermatologist.    Melony Overly, MD 06/17/15 2121

## 2015-06-17 NOTE — ED Notes (Signed)
The patient presented to the [facility] with a rash on his extremities and trunk area that has been there for 5 weeks. The patient was prescribed prednisone for a possible tuna fish allergy via MD live. The patient stated that once the prednisone dose was completed the rash came back.

## 2015-07-15 ENCOUNTER — Other Ambulatory Visit: Payer: Self-pay | Admitting: Dermatology

## 2015-07-15 ENCOUNTER — Other Ambulatory Visit: Payer: 59

## 2015-07-15 DIAGNOSIS — B20 Human immunodeficiency virus [HIV] disease: Secondary | ICD-10-CM

## 2015-07-16 ENCOUNTER — Encounter (INDEPENDENT_AMBULATORY_CARE_PROVIDER_SITE_OTHER): Payer: 59 | Admitting: *Deleted

## 2015-07-16 ENCOUNTER — Other Ambulatory Visit: Payer: 59

## 2015-07-16 VITALS — BP 107/70 | HR 73 | Temp 98.7°F | Resp 16 | Wt 158.8 lb

## 2015-07-16 DIAGNOSIS — Z006 Encounter for examination for normal comparison and control in clinical research program: Secondary | ICD-10-CM

## 2015-07-16 LAB — COMPREHENSIVE METABOLIC PANEL
ALK PHOS: 92 U/L (ref 40–115)
ALT: 21 U/L (ref 9–46)
AST: 23 U/L (ref 10–40)
Albumin: 3.9 g/dL (ref 3.6–5.1)
BUN: 12 mg/dL (ref 7–25)
CALCIUM: 9.1 mg/dL (ref 8.6–10.3)
CHLORIDE: 103 mmol/L (ref 98–110)
CO2: 23 mmol/L (ref 20–31)
Creat: 1.12 mg/dL (ref 0.60–1.35)
GLUCOSE: 89 mg/dL (ref 65–99)
POTASSIUM: 4.3 mmol/L (ref 3.5–5.3)
Sodium: 139 mmol/L (ref 135–146)
Total Bilirubin: 0.4 mg/dL (ref 0.2–1.2)
Total Protein: 6.7 g/dL (ref 6.1–8.1)

## 2015-07-16 NOTE — Progress Notes (Signed)
[name] is here for his month 12 visit for Reprieve, A Randomized Trial to Prevent Vascular Events in HIV (study drug is Pitavastatin 4mg  or placebo). He has been working out and losing weight and "feels good". Has been having a migrating rash that was initially diagnosed as scabies at Urgent Care, but did not clear with treatment. He also has noticed unusual bruising come up on his legs. He saw a dermatologist yesterday (Dr. Denna Haggard) who did a scraping of the rash and a punch biopsy on the bruising. He had labs drawn yesterday for HIV VL and a CD4. He say he has been very adherent with his meds and pill counts are on target. He is to see Dr. Tommy Medal on March 20th and will return for research on June 9th.

## 2015-07-17 LAB — T-HELPER CELL (CD4) - (RCID CLINIC ONLY)
CD4 T CELL HELPER: 23 % — AB (ref 33–55)
CD4 T Cell Abs: 570 /uL (ref 400–2700)

## 2015-07-17 LAB — HIV-1 RNA QUANT-NO REFLEX-BLD
HIV 1 RNA Quant: 25 copies/mL — ABNORMAL HIGH (ref <20)
HIV-1 RNA QUANT, LOG: 1.4 {Log_copies}/mL — AB (ref <1.30)

## 2015-07-20 ENCOUNTER — Encounter: Payer: Self-pay | Admitting: Infectious Disease

## 2015-07-23 ENCOUNTER — Telehealth: Payer: Self-pay

## 2015-07-23 NOTE — Telephone Encounter (Signed)
Would need visit sooner than July for continued xanax. Would appear he was given 6 month supply in September so not due until end of march

## 2015-07-23 NOTE — Telephone Encounter (Signed)
Patient states he has enough xanax to last until July,2017 appt---so he will just wait until that appt to get refill

## 2015-07-23 NOTE — Telephone Encounter (Signed)
Patient is established care with you---last OV with dr Asa Lente July/2016---has scheduled July/2017 to see you---i am receiving faxed rx refill request from [facility] for Xanax ----please advise, i will let patient know, thanks

## 2015-08-03 ENCOUNTER — Ambulatory Visit (INDEPENDENT_AMBULATORY_CARE_PROVIDER_SITE_OTHER): Payer: 59 | Admitting: Family Medicine

## 2015-08-03 ENCOUNTER — Encounter: Payer: Self-pay | Admitting: Family Medicine

## 2015-08-03 VITALS — BP 118/70 | HR 80 | Temp 97.8°F | Wt 158.7 lb

## 2015-08-03 DIAGNOSIS — Z8619 Personal history of other infectious and parasitic diseases: Secondary | ICD-10-CM | POA: Diagnosis not present

## 2015-08-03 DIAGNOSIS — Z8572 Personal history of non-Hodgkin lymphomas: Secondary | ICD-10-CM

## 2015-08-03 DIAGNOSIS — B20 Human immunodeficiency virus [HIV] disease: Secondary | ICD-10-CM

## 2015-08-03 DIAGNOSIS — H109 Unspecified conjunctivitis: Secondary | ICD-10-CM

## 2015-08-03 NOTE — Progress Notes (Signed)
Pre visit review using our clinic review tool, if applicable. No additional management support is needed unless otherwise documented below in the visit note. 

## 2015-08-03 NOTE — Patient Instructions (Signed)
Please contact opthamologist for further evaluation. Follow up with PCP if symptoms persist after evaluation with ophthamology  Bacterial Conjunctivitis Bacterial conjunctivitis, commonly called pink eye, is an inflammation of the clear membrane that covers the white part of the eye (conjunctiva). The inflammation can also happen on the underside of the eyelids. The blood vessels in the conjunctiva become inflamed, causing the eye to become red or pink. Bacterial conjunctivitis may spread easily from one eye to another and from person to person (contagious).  CAUSES  Bacterial conjunctivitis is caused by bacteria. The bacteria may come from your own skin, your upper respiratory tract, or from someone else with bacterial conjunctivitis. SYMPTOMS  The normally white color of the eye or the underside of the eyelid is usually pink or red. The pink eye is usually associated with irritation, tearing, and some sensitivity to light. Bacterial conjunctivitis is often associated with a thick, yellowish discharge from the eye. The discharge may turn into a crust on the eyelids overnight, which causes your eyelids to stick together. If a discharge is present, there may also be some blurred vision in the affected eye. DIAGNOSIS  Bacterial conjunctivitis is diagnosed by your caregiver through an eye exam and the symptoms that you report. Your caregiver looks for changes in the surface tissues of your eyes, which may point to the specific type of conjunctivitis. A sample of any discharge may be collected on a cotton-tip swab if you have a severe case of conjunctivitis, if your cornea is affected, or if you keep getting repeat infections that do not respond to treatment. The sample will be sent to a lab to see if the inflammation is caused by a bacterial infection and to see if the infection will respond to antibiotic medicines. TREATMENT   Bacterial conjunctivitis is treated with antibiotics. Antibiotic eyedrops are  most often used. However, antibiotic ointments are also available. Antibiotics pills are sometimes used. Artificial tears or eye washes may ease discomfort. HOME CARE INSTRUCTIONS   To ease discomfort, apply a cool, clean washcloth to your eye for 10-20 minutes, 3-4 times a day.  Gently wipe away any drainage from your eye with a warm, wet washcloth or a cotton ball.  Wash your hands often with soap and water. Use paper towels to dry your hands.  Do not share towels or washcloths. This may spread the infection.  Change or wash your pillowcase every day.  You should not use eye makeup until the infection is gone.  Do not operate machinery or drive if your vision is blurred.  Stop using contact lenses. Ask your caregiver how to sterilize or replace your contacts before using them again. This depends on the type of contact lenses that you use.  When applying medicine to the infected eye, do not touch the edge of your eyelid with the eyedrop bottle or ointment tube. SEEK IMMEDIATE MEDICAL CARE IF:   Your infection has not improved within 3 days after beginning treatment.  You had yellow discharge from your eye and it returns.  You have increased eye pain.  Your eye redness is spreading.  Your vision becomes blurred.  You have a fever or persistent symptoms for more than 2-3 days.  You have a fever and your symptoms suddenly get worse.  You have facial pain, redness, or swelling. MAKE SURE YOU:   Understand these instructions.  Will watch your condition.  Will get help right away if you are not doing well or get worse.   This  information is not intended to replace advice given to you by your health care provider. Make sure you discuss any questions you have with your health care provider.   Document Released: 05/23/2005 Document Revised: 06/13/2014 Document Reviewed: 10/24/2011 Elsevier Interactive Patient Education [employer].

## 2015-08-03 NOTE — Progress Notes (Signed)
Subjective:    Patient ID: [name], male    DOB: 08/26/1965, 50 y.o.   MRN: [id number]  HPI  [name] is a 50 year old male who presents today with bilateral eye redness and tearing. Symptoms started on 07/30/15 after sleeping in daily wear contacts for 2 days. On 08/01/15, he contacted a provider through his work through a phone appointment who prescribed him polymyxinB/trimethoprim drops every 4 hours for 7 days.  Today, patient states that he is using the drops as prescribed but notes that symptoms have not improved. Today, both eyes are red and watery and he notes that vision appears "blurry" but he is able to see with this glasses. Patient denies pain, purulent drainage, photophobia, fever, chills, or sweats. Pertinent history includes +HIV and history of non-hodgkin's lymphoma in 2012. Treatment at home in addition to prescribed drops include ibuprofen which has provided limited benefit.  Review of Systems  Constitutional: Negative for fever and chills.  HENT: Positive for rhinorrhea. Negative for postnasal drip and sinus pressure.        "scratchy throat"  Eyes: Positive for redness and visual disturbance. Negative for photophobia and pain.       Patient states that his vision is "blurry"  Skin: Negative for rash.  Allergic/Immunologic: Positive for environmental allergies.  Neurological: Negative for dizziness, light-headedness and headaches.   Past Medical History  Diagnosis Date  . ALLERGIC RHINITIS   . Anal fissure     Chronic, recurrent anal bleeding with pain  . Depression   . HIV INFECTION 07/05/2010 dx  . NEUTROPENIA, DRUG-INDUCED     on chemo early 2012  . 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 Thrombocytopenia ([place])     resolved  . Toe fracture, right  10/04/2012    great toe, nonsurgical mgmt Sharol Given)  . 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 Marital Status: Married    Spouse Name: N/A  . Number of Children: N/A  . Years of Education: N/A   Occupational History  . Not on file.   Social History Main Topics  . Smoking status: Former Smoker    Quit date: 01/04/2005  . Smokeless tobacco: Not on file  . Alcohol Use: 0.0 oz/week    0 Standard drinks or equivalent per week     Comment: occasional, socially  . Drug Use: No  . Sexual Activity: Yes     Comment: delined condoms   Other Topics Concern  . 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 Nasal septum surgery  2006  . Monteggia fracture surgery  2010    L elbow  . Precancer lesion removed from anus  2012  . 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 Lymph node biopsy  1975    L ingunial - nondx    Family History  Problem Relation Age of Onset  . Alcohol abuse Mother   . Cancer Father 11    unknown primary  . Hypertension Mother   . Hyperlipidemia  Brother   . Alcohol abuse Father   . Cirrhosis Father     alcohol  . Macular degeneration Brother     histoplasm  . Hypertension Sister 81    carotid blockage    Allergies  Allergen Reactions  . Sulfonamide Derivatives     REACTION: rash, swelling, trouble breathing    Current Outpatient Prescriptions on File Prior to Visit  Medication Sig Dispense Refill  . ALPRAZolam (XANAX) 0.25 MG tablet Take 1 tablet (0.25 mg total) by mouth 2 (two) times daily as needed for anxiety. 180 tablet 1  . Multiple Vitamin (ONE-A-DAY MENS PO) Take 1 capsule by mouth daily.    . Pitavastatin Calcium 4 MG TABS Take 1 tablet (4 mg total) by mouth daily. 30 tablet 11  . testosterone cypionate (DEPOTESTOTERONE  CYPIONATE) 100 MG/ML injection Inject 1 mL (100 mg total) into the muscle once a week. For IM use only 10 mL 2  . abacavir-dolutegravir-lamiVUDine (TRIUMEQ) 600-50-300 MG tablet Take 1 tablet by mouth daily. 30 tablet 11   No current facility-administered medications on file prior to visit.    BP 118/70 mmHg  Pulse 80  Temp(Src) 97.8 F (36.6 C) (Oral)  Wt 158 lb 11.2 oz (71.986 kg)  SpO2 97%       Objective:   Physical Exam  Constitutional: He is oriented to person, place, and time.  HENT:  Nose: Rhinorrhea present.  Mouth/Throat: No posterior oropharyngeal erythema.  Eyes: Pupils are equal, round, and reactive to light. Right eye exhibits no hordeolum. Right conjunctiva is injected. Left conjunctiva is injected.  Vision screening results: 10/20 Right eye, 10/12.5 Left eye, and 10/16 both eyes with glasses  Cardiovascular: Normal rate, regular rhythm and normal heart sounds.   Pulmonary/Chest: Effort normal and breath sounds normal.  Lymphadenopathy:    He has no cervical adenopathy.  Neurological: He is alert and oriented to person, place, and time.      Assessment & Plan:  1. Bilateral conjunctivitis Continue polymyxinB/trimethoprim drops as prescribed 2 drops in both eyes every 4 hours. Patient will see opthamologist today for further evaluation to r/o infectious keratitis or other acute opthamologic conditions due to patient report of sleeping in daily wear contact lenses for 2 days and report of no improvement in symptoms since starting drops 2 days ago. Patient voiced understanding and agreed with plan.   2. History of HIV infection Last CD4 T cell result was 570 on 07/15/15 with CD4%  Helper T cell 23 (Low)   3. History of non-Hodgkin's lymphoma

## 2015-08-24 ENCOUNTER — Ambulatory Visit: Payer: 59 | Admitting: Infectious Disease

## 2015-09-14 ENCOUNTER — Other Ambulatory Visit: Payer: Self-pay | Admitting: Infectious Disease

## 2015-09-14 DIAGNOSIS — E291 Testicular hypofunction: Secondary | ICD-10-CM

## 2015-09-17 ENCOUNTER — Telehealth: Payer: Self-pay | Admitting: Internal Medicine

## 2015-09-17 ENCOUNTER — Other Ambulatory Visit: Payer: 59

## 2015-09-17 ENCOUNTER — Encounter: Payer: Self-pay | Admitting: Infectious Disease

## 2015-09-17 ENCOUNTER — Ambulatory Visit (INDEPENDENT_AMBULATORY_CARE_PROVIDER_SITE_OTHER): Payer: 59 | Admitting: Family Medicine

## 2015-09-17 VITALS — BP 110/68 | HR 69 | Temp 98.0°F | Resp 18 | Ht 69.0 in | Wt 152.0 lb

## 2015-09-17 DIAGNOSIS — Z202 Contact with and (suspected) exposure to infections with a predominantly sexual mode of transmission: Secondary | ICD-10-CM | POA: Diagnosis not present

## 2015-09-17 NOTE — Patient Instructions (Addendum)
Widor toccata

## 2015-09-17 NOTE — Telephone Encounter (Signed)
Would need an acute office visit to do this.

## 2015-09-17 NOTE — Telephone Encounter (Signed)
Patient called to advise that he may have been exposed to an STI- unsure of exactly what it is. He was told that it was possible chlamydia or gonorrhea. He is exhibiting no sx at this point.   Establishment appt is set up with dr Sharlet Salina on 12/24/2015. Is it possible to order an STD panel and make a follow up as needed?

## 2015-09-17 NOTE — Telephone Encounter (Signed)
Called patient and advised to utilize urgent care for today and call us for whatever follow up is needed

## 2015-09-17 NOTE — Progress Notes (Signed)
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  Needs STD testing as partner tested "positive" for something.  His PCP was Dr. Asa Lente who has retired and new doctor could not work him in  Objective:  NAD Normal genitalia with no ulcerations or growths, no tenderness or discharge, circ male  STD exposure - Plan: Hepatitis C antibody, GC/Chlamydia Probe Amp, RPR  Robyn Haber, MD

## 2015-09-18 ENCOUNTER — Telehealth: Payer: Self-pay

## 2015-09-18 LAB — HEPATITIS C ANTIBODY: HCV Ab: NEGATIVE

## 2015-09-18 LAB — GC/CHLAMYDIA PROBE AMP
CT Probe RNA: NOT DETECTED
GC Probe RNA: NOT DETECTED

## 2015-09-18 NOTE — Telephone Encounter (Signed)
Pt. Called in for the lab results

## 2015-09-18 NOTE — Telephone Encounter (Signed)
Gonorrhea and chlamydia tests are negative

## 2015-09-19 LAB — RPR

## 2015-09-21 ENCOUNTER — Encounter: Payer: Self-pay | Admitting: Infectious Disease

## 2015-09-21 NOTE — Telephone Encounter (Signed)
Patient called in wanting test results, I gave him the results and he was very thankful, but he would like to have them posted to [facility], I told him I would put a message in so that someone would know to do this. Thank you!

## 2015-09-21 NOTE — Telephone Encounter (Signed)
Patient called in wanting test results, I gave him the results and he was very thankful, but he would like to have them posted to [facility], I told him I would put a message in so that someone would know to do this. Thank you!

## 2015-09-22 NOTE — Telephone Encounter (Signed)
Results released

## 2015-09-23 ENCOUNTER — Ambulatory Visit: Payer: 59 | Admitting: Infectious Disease

## 2015-10-07 ENCOUNTER — Ambulatory Visit: Payer: 59 | Admitting: Infectious Disease

## 2015-11-19 ENCOUNTER — Encounter (INDEPENDENT_AMBULATORY_CARE_PROVIDER_SITE_OTHER): Payer: 59 | Admitting: *Deleted

## 2015-11-19 VITALS — BP 111/76 | HR 67 | Temp 98.4°F | Resp 16 | Wt 160.2 lb

## 2015-11-19 DIAGNOSIS — Z006 Encounter for examination for normal comparison and control in clinical research program: Secondary | ICD-10-CM

## 2015-11-19 NOTE — Progress Notes (Signed)
[name] is here for his 16 month visit for Reprieve, A Randomized Trial to Prevent Vascular Events in HIV (study drug is Pitavastatin 4mg  or placebo). He denies any new problems or concerns. He says his adherence is excellent and denies any muscle pain or weakness. We discussed study [id number] which he is eligible for. The study will perform frailty testing yearly and he has agreed to enroll at the next visit. He will return on 03/16/16.

## 2015-12-09 ENCOUNTER — Ambulatory Visit (INDEPENDENT_AMBULATORY_CARE_PROVIDER_SITE_OTHER): Payer: 59 | Admitting: Infectious Disease

## 2015-12-09 ENCOUNTER — Encounter: Payer: Self-pay | Admitting: Infectious Disease

## 2015-12-09 ENCOUNTER — Ambulatory Visit: Payer: 59 | Admitting: *Deleted

## 2015-12-09 VITALS — BP 123/84 | HR 68 | Temp 98.0°F | Ht 69.0 in | Wt 162.0 lb

## 2015-12-09 DIAGNOSIS — F329 Major depressive disorder, single episode, unspecified: Secondary | ICD-10-CM | POA: Diagnosis not present

## 2015-12-09 DIAGNOSIS — F4321 Adjustment disorder with depressed mood: Secondary | ICD-10-CM | POA: Diagnosis not present

## 2015-12-09 DIAGNOSIS — E291 Testicular hypofunction: Secondary | ICD-10-CM

## 2015-12-09 DIAGNOSIS — C8580 Other specified types of non-Hodgkin lymphoma, unspecified site: Secondary | ICD-10-CM | POA: Diagnosis not present

## 2015-12-09 DIAGNOSIS — B20 Human immunodeficiency virus [HIV] disease: Secondary | ICD-10-CM

## 2015-12-09 DIAGNOSIS — F32A Depression, unspecified: Secondary | ICD-10-CM

## 2015-12-09 DIAGNOSIS — D099 Carcinoma in situ, unspecified: Secondary | ICD-10-CM

## 2015-12-09 DIAGNOSIS — Z630 Problems in relationship with spouse or partner: Secondary | ICD-10-CM | POA: Insufficient documentation

## 2015-12-09 HISTORY — DX: Other specified types of non-hodgkin lymphoma, unspecified site: C85.80

## 2015-12-09 HISTORY — DX: Problems in relationship with spouse or partner: Z63.0

## 2015-12-09 HISTORY — DX: Adjustment disorder with depressed mood: F43.21

## 2015-12-09 LAB — CBC WITH DIFFERENTIAL/PLATELET
Basophils Absolute: 81 cells/uL (ref 0–200)
Basophils Relative: 1 %
EOS PCT: 1 %
Eosinophils Absolute: 81 cells/uL (ref 15–500)
HCT: 45 % (ref 38.5–50.0)
HEMOGLOBIN: 15.5 g/dL (ref 13.2–17.1)
LYMPHS ABS: 2106 {cells}/uL (ref 850–3900)
Lymphocytes Relative: 26 %
MCH: 33.5 pg — AB (ref 27.0–33.0)
MCHC: 34.4 g/dL (ref 32.0–36.0)
MCV: 97.4 fL (ref 80.0–100.0)
MONOS PCT: 9 %
MPV: 10.3 fL (ref 7.5–12.5)
Monocytes Absolute: 729 cells/uL (ref 200–950)
NEUTROS ABS: 5103 {cells}/uL (ref 1500–7800)
Neutrophils Relative %: 63 %
PLATELETS: 260 10*3/uL (ref 140–400)
RBC: 4.62 MIL/uL (ref 4.20–5.80)
RDW: 14 % (ref 11.0–15.0)
WBC: 8.1 10*3/uL (ref 3.8–10.8)

## 2015-12-09 NOTE — BH Specialist Note (Signed)
Counselor met with [name] today as a warm handoff by Dr. Tommy Medal.  Patient was oriented times four with good affect and dress.  Patient was alert and anxious to talk to counselor about several things going on in his life right now. Counselor provided support and encouragement.  Patient was a bit dishoveld but shared freely about his life. Counselor explained how counseling works and recommended that he make an appointment.  Patient made an appointment for next week.   Rolena Infante, MA, LPC [facility]

## 2015-12-09 NOTE — Progress Notes (Signed)
Subjective:   Chief complaint: depressive symptoms related to problems with his spouse (see below), grieving loss of his mother and followup for his HIV    Patient ID: [name], male    DOB: Apr 11, 1966, 50 y.o.   MRN: [id number]  HPI   [name] is a 50 y.o. male with HIV/AIDS who is doing well on his current ARV of [place] with controlled virus and healthy CD4.     Lab Results  Component Value Date   HIV1RNAQUANT 25* 07/15/2015   HIV1RNAQUANT 30* 02/11/2015   HIV1RNAQUANT <20 11/11/2014     Lab Results  Component Value Date   CD4TABS 570 07/15/2015   CD4TABS 410 02/25/2015   CD4TABS 1420 02/11/2015    He has had significant stressors recently. His mother had sudden cardiac arrest in the middle of the night found in her bed with her two dogs next to her. [name] typically spoke with his mother every week to every 2 weeks and is certainly depressed by this.   EVEN MORE so [name] is stressed by his separation from his husband of 15 years (also my patient). His husband is now a legal immigrant with a green card but [name] has feared him NOT being on his insurance and therefore they have not gone through with divorce or even with physical separation.   I have asked that he meet with Leveda Anna further and also that he and his spouse would likely benefit from discussion with a counselor since I would think that from HIV standpoint his partner should be able to enroll on ADAP though this would not provide him with the other benefits he would lose with insurance it would assure that his husbands HIV would continue to be managed.  Past Medical History  Diagnosis Date  . ALLERGIC RHINITIS   . Anal fissure     Chronic, recurrent anal bleeding with pain  . Depression   . HIV INFECTION 07/05/2010 dx  . NEUTROPENIA, DRUG-INDUCED     on chemo early 2012  . 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 Thrombocytopenia ([place])     resolved  . Toe fracture, right 10/04/2012    great toe, nonsurgical mgmt Sharol Given)  . 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 Grieving 12/09/2015  . Problems in relationship with spouse or partner 12/09/2015    Past Surgical History  Procedure Laterality Date  . Nasal septum surgery  2006  . [place] fracture surgery  2010    L elbow  . Precancer lesion removed from anus  2012  . 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 Lymph node biopsy  1975    L ingunial - nondx    Family History  Problem Relation Age of Onset  . Alcohol abuse Mother   . Cancer Father 90    unknown primary  . Hypertension Mother   . Hyperlipidemia Brother   . Alcohol abuse Father   . Cirrhosis Father     alcohol  . Macular degeneration Brother     histoplasm  . Hypertension Sister 11    carotid blockage      Social History   Social History  .  Marital Status: Married    Spouse Name: N/A  . Number of Children: N/A  . Years of Education: N/A   Social History Main Topics  . Smoking status: Former Smoker    Quit date: 01/04/2005  . Smokeless tobacco: None  . Alcohol Use: 0.0 oz/week    0 Standard drinks or equivalent per week     Comment: occasional, socially  . Drug Use: No  . Sexual Activity: Yes     Comment: delined condoms   Other Topics Concern  . None   Social History Narrative   Married - Leonardo   employed at [employer]    Allergies  Allergen Reactions  . Sulfonamide Derivatives     REACTION: rash, swelling, trouble breathing     Current outpatient prescriptions:  .  abacavir-dolutegravir-lamiVUDine (TRIUMEQ) 600-50-300 MG tablet, Take 1 tablet by mouth daily., Disp: 30 tablet, Rfl: 11 .  ALPRAZolam (XANAX) 0.25  MG tablet, Take 1 tablet (0.25 mg total) by mouth 2 (two) times daily as needed for anxiety., Disp: 180 tablet, Rfl: 1 .  Multiple Vitamin (ONE-A-DAY MENS PO), Take 1 capsule by mouth daily., Disp: , Rfl:  .  Pitavastatin Calcium 4 MG TABS, Take 1 tablet (4 mg total) by mouth daily., Disp: 30 tablet, Rfl: 11 .  testosterone cypionate (DEPOTESTOTERONE CYPIONATE) 100 MG/ML injection, INJECT 1 ML I.M. ONCE EACH WEEK, Disp: 10 mL, Rfl: 4 .  polymyxin B 500,000 Units in sodium chloride irrigation 0.9 % 500 mL, Irrigate with as directed once. Reported on 12/09/2015, Disp: , Rfl:     Review of Systems  Constitutional: Negative for fever, chills, diaphoresis, activity change, appetite change, fatigue and unexpected weight change.  HENT: Negative for rhinorrhea, sinus pressure, sneezing and sore throat.   Eyes: Negative for photophobia and visual disturbance.  Respiratory: Negative for chest tightness and wheezing.   Cardiovascular: Negative for chest pain, palpitations and leg swelling.  Gastrointestinal: Negative for nausea, constipation, blood in stool, abdominal distention and anal bleeding.  Genitourinary: Negative for dysuria, hematuria, flank pain and difficulty urinating.  Musculoskeletal: Negative for myalgias, back pain, joint swelling, arthralgias and gait problem.  Skin: Negative for color change, pallor, rash and wound.  Neurological: Negative for dizziness, tremors, weakness and light-headedness.  Hematological: Negative for adenopathy. Does not bruise/bleed easily.  Psychiatric/Behavioral: Positive for dysphoric mood. Negative for behavioral problems, confusion, sleep disturbance, decreased concentration and agitation.       Objective:   Physical Exam  Constitutional: He is oriented to person, place, and time. He appears well-developed and well-nourished.  HENT:  Head: Normocephalic and atraumatic.  Eyes: Conjunctivae and EOM are normal.  Neck: Normal range of motion. Neck supple.   Cardiovascular: Normal rate and regular rhythm.   Pulmonary/Chest: Effort normal. No respiratory distress. He has no wheezes.  Abdominal: Soft. He exhibits no distension.  Musculoskeletal: Normal range of motion. He exhibits no edema or tenderness.  Neurological: He is alert and oriented to person, place, and time.  Skin: Skin is warm and dry. No rash noted. No erythema. No pallor.  Psychiatric: His behavior is normal. Judgment and thought content normal. He exhibits a depressed mood.          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 Surgery   Low testosterone: now on replacement therapy  STI screening: will check for GC and chalmydia from OP, rectum, urine and check RPR and HCV  I spent greater than 40 minutes with the patient including greater than 50% of time in face to face counsel of the patient re his grieving, his depression, relationship stress, HIV, STI screening and in coordination of his care.

## 2015-12-10 LAB — COMPLETE METABOLIC PANEL WITH GFR
ALT: 22 U/L (ref 9–46)
AST: 19 U/L (ref 10–40)
Albumin: 4.3 g/dL (ref 3.6–5.1)
Alkaline Phosphatase: 77 U/L (ref 40–115)
BUN: 14 mg/dL (ref 7–25)
CALCIUM: 9.3 mg/dL (ref 8.6–10.3)
CHLORIDE: 105 mmol/L (ref 98–110)
CO2: 25 mmol/L (ref 20–31)
CREATININE: 1.07 mg/dL (ref 0.60–1.35)
GFR, EST NON AFRICAN AMERICAN: 82 mL/min (ref >=60)
Glucose, Bld: 91 mg/dL (ref 65–99)
POTASSIUM: 3.9 mmol/L (ref 3.5–5.3)
Sodium: 141 mmol/L (ref 135–146)
Total Bilirubin: 0.5 mg/dL (ref 0.2–1.2)
Total Protein: 6.8 g/dL (ref 6.1–8.1)

## 2015-12-10 LAB — LIPID PANEL
CHOL/HDL RATIO: 2.7 ratio (ref <=5.0)
Cholesterol: 149 mg/dL (ref 125–200)
HDL: 55 mg/dL (ref >=40)
LDL CALC: 73 mg/dL (ref <130)
Triglycerides: 105 mg/dL (ref <150)
VLDL: 21 mg/dL (ref <30)

## 2015-12-10 LAB — RPR

## 2015-12-11 LAB — HIV-1 RNA ULTRAQUANT REFLEX TO GENTYP+: HIV-1 RNA Quant, Log: <1.3 Log copies/mL (ref <1.30)

## 2015-12-11 LAB — CYTOLOGY, (ORAL, ANAL, URETHRAL) ANCILLARY ONLY
CHLAMYDIA, DNA PROBE: NEGATIVE
CHLAMYDIA, DNA PROBE: NEGATIVE
NEISSERIA GONORRHEA: NEGATIVE
NEISSERIA GONORRHEA: NEGATIVE

## 2015-12-11 LAB — URINE CYTOLOGY ANCILLARY ONLY
CHLAMYDIA, DNA PROBE: NEGATIVE
Neisseria Gonorrhea: NEGATIVE

## 2015-12-11 LAB — T-HELPER CELL (CD4) - (RCID CLINIC ONLY)
CD4 % Helper T Cell: 28 % — ABNORMAL LOW (ref 33–55)
CD4 T Cell Abs: 650 /uL (ref 400–2700)

## 2015-12-24 ENCOUNTER — Other Ambulatory Visit: Payer: 59

## 2015-12-24 ENCOUNTER — Encounter: Payer: 59 | Admitting: Internal Medicine

## 2015-12-24 ENCOUNTER — Ambulatory Visit (INDEPENDENT_AMBULATORY_CARE_PROVIDER_SITE_OTHER): Payer: 59 | Admitting: Internal Medicine

## 2015-12-24 ENCOUNTER — Encounter: Payer: Self-pay | Admitting: Internal Medicine

## 2015-12-24 VITALS — BP 122/64 | HR 64 | Temp 98.3°F | Resp 16 | Ht 69.0 in | Wt 164.0 lb

## 2015-12-24 DIAGNOSIS — Z0000 Encounter for general adult medical examination without abnormal findings: Secondary | ICD-10-CM | POA: Insufficient documentation

## 2015-12-24 DIAGNOSIS — B20 Human immunodeficiency virus [HIV] disease: Secondary | ICD-10-CM

## 2015-12-24 DIAGNOSIS — Z129 Encounter for screening for malignant neoplasm, site unspecified: Secondary | ICD-10-CM

## 2015-12-24 DIAGNOSIS — F419 Anxiety disorder, unspecified: Secondary | ICD-10-CM | POA: Diagnosis not present

## 2015-12-24 MED ORDER — SILDENAFIL CITRATE 100 MG PO TABS: 50.0000 mg | ORAL_TABLET | Freq: Every day | ORAL | Status: DC | PRN

## 2015-12-24 MED ORDER — ALPRAZOLAM 0.25 MG PO TABS: 0.2500 mg | ORAL_TABLET | Freq: Two times a day (BID) | ORAL | Status: DC | PRN

## 2015-12-24 NOTE — Progress Notes (Signed)
   Subjective:    Patient ID: [name], male    DOB: Dec 03, 1965, 50 y.o.   MRN: [id number]  HPI The patient is a 50 YO man coming in for wellness. No new concerns. Please see A/P for status and treatment of chronic medical problems. Non-smoker and quit Feb 2017.   PMH, [facility], social history reviewed and updated.   Review of Systems  Constitutional: Negative for activity change, appetite change and fatigue.  HENT: Negative.   Eyes: Negative.   Respiratory: Negative for cough, chest tightness and shortness of breath.   Cardiovascular: Negative for chest pain, palpitations and leg swelling.  Gastrointestinal: Negative for nausea, abdominal pain, diarrhea, constipation and abdominal distention.  Musculoskeletal: Negative.   Skin: Negative.   Neurological: Negative.   Psychiatric/Behavioral: Positive for dysphoric mood. Negative for suicidal ideas, confusion, sleep disturbance, self-injury and decreased concentration. The patient is nervous/anxious.      Objective:   Physical Exam  Constitutional: He is oriented to person, place, and time. He appears well-developed and well-nourished.  HENT:  Head: Normocephalic and atraumatic.  Eyes: EOM are normal.  Neck: Normal range of motion.  Cardiovascular: Normal rate and regular rhythm.   Pulmonary/Chest: Effort normal and breath sounds normal. No respiratory distress. He has no wheezes. He has no rales.  Abdominal: Soft. Bowel sounds are normal. He exhibits no distension. There is no tenderness. There is no rebound.  Musculoskeletal: He exhibits no edema.  Neurological: He is alert and oriented to person, place, and time. Coordination normal.  Skin: Skin is warm and dry.  Psychiatric: He has a normal mood and affect.   Filed Vitals:   12/24/15 0829  BP: 122/64  Pulse: 64  Temp: 98.3 F (36.8 C)  TempSrc: Oral  Resp: 16  Height: 5\' 9"  (1.753 m)  Weight: 164 lb (74.39 kg)  SpO2: 98%      Assessment & Plan:

## 2015-12-24 NOTE — Assessment & Plan Note (Signed)
Seeing ID, on triumeq with last viral load undetected and CD4 650. No side effects or missed pills.

## 2015-12-24 NOTE — Assessment & Plan Note (Signed)
Has had colonoscopy in the last year which was normal so not due at 43. Immunizations up to date and counseled on the yearly flu shot. Does get vision screening. Wants to get [facility] hereditary cancer screening and counseled him on the risks and benefits of this screening. 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 Ordered.

## 2015-12-24 NOTE — Assessment & Plan Note (Signed)
Uses xanax prn and rare. Refill given today and last refill was about 1 year ago. Talked to him about the risk of ongoing benzos and he agrees with the risks.

## 2015-12-24 NOTE — Patient Instructions (Signed)
We will fax in the alprazolam to the mail order.   We have sent in the viagra to the local pharmacy and you can try 1/2 pill as needed and if results are not to your satisfaction you can increase to 1 pill as needed. If your erection is lasting longer than 3 hours you need to seek medical attention immediately. As always remember to use safe sexual practices.   Health Maintenance, Male A healthy lifestyle and preventative care can promote health and wellness.  Maintain regular health, dental, and eye exams.  Eat a healthy diet. Foods like vegetables, fruits, whole grains, low-fat dairy products, and lean protein foods contain the nutrients you need and are low in calories. Decrease your intake of foods high in solid fats, added sugars, and salt. Get information about a proper diet from your health care provider, if necessary.  Regular physical exercise is one of the most important things you can do for your health. Most adults should get at least 150 minutes of moderate-intensity exercise (any activity that increases your heart rate and causes you to sweat) each week. In addition, most adults need muscle-strengthening exercises on 2 or more days a week.   Maintain a healthy weight. The body mass index (BMI) is a screening tool to identify possible weight problems. It provides an estimate of body fat based on height and weight. Your health care provider can find your BMI and can help you achieve or maintain a healthy weight. For males 20 years and older:  A BMI below 18.5 is considered underweight.  A BMI of 18.5 to 24.9 is normal.  A BMI of 25 to 29.9 is considered overweight.  A BMI of 30 and above is considered obese.  Maintain normal blood lipids and cholesterol by exercising and minimizing your intake of saturated fat. Eat a balanced diet with plenty of fruits and vegetables. Blood tests for lipids and cholesterol should begin at age 35 and be repeated every 5 years. If your lipid or  cholesterol levels are high, you are over age 74, or you are at high risk for heart disease, you may need your cholesterol levels checked more frequently.Ongoing high lipid and cholesterol levels should be treated with medicines if diet and exercise are not working.  If you smoke, find out from your health care provider how to quit. If you do not use tobacco, do not start.  Lung cancer screening is recommended for adults aged 15-80 years who are at high risk for developing lung cancer because of a history of smoking. A yearly low-dose CT scan of the lungs is recommended for people who have at least a 30-pack-year history of smoking and are current smokers or have quit within the past 15 years. A pack year of smoking is smoking an average of 1 pack of cigarettes a day for 1 year (for example, a 30-pack-year history of smoking could mean smoking 1 pack a day for 30 years or 2 packs a day for 15 years). Yearly screening should continue until the smoker has stopped smoking for at least 15 years. Yearly screening should be stopped for people who develop a health problem that would prevent them from having lung cancer treatment.  If you choose to drink alcohol, do not have more than 2 drinks per day. One drink is considered to be 12 oz (360 mL) of beer, 5 oz (150 mL) of wine, or 1.5 oz (45 mL) of liquor.  Avoid the use of street drugs. Do  not share needles with anyone. Ask for help if you need support or instructions about stopping the use of drugs.  High blood pressure causes heart disease and increases the risk of stroke. High blood pressure is more likely to develop in:  People who have blood pressure in the end of the normal range (100-139/85-89 mm Hg).  People who are overweight or obese.  People who are African American.  If you are 49-45 years of age, have your blood pressure checked every 3-5 years. If you are 45 years of age or older, have your blood pressure checked every year. You should have  your blood pressure measured twice--once when you are at a hospital or clinic, and once when you are not at a hospital or clinic. Record the average of the two measurements. To check your blood pressure when you are not at a hospital or clinic, you can use:  An automated blood pressure machine at a pharmacy.  A home blood pressure monitor.  If you are 83-24 years old, ask your health care provider if you should take aspirin to prevent heart disease.  Diabetes screening involves taking a blood sample to check your fasting blood sugar level. This should be done once every 3 years after age 45 if you are at a normal weight and without risk factors for diabetes. Testing should be considered at a younger age or be carried out more frequently if you are overweight and have at least 1 risk factor for diabetes.  Colorectal cancer can be detected and often prevented. Most routine colorectal cancer screening begins at the age of 15 and continues through age 21. However, your health care provider may recommend screening at an earlier age if you have risk factors for colon cancer. On a yearly basis, your health care provider may provide home test kits to check for hidden blood in the stool. A small camera at the end of a tube may be used to directly examine the colon (sigmoidoscopy or colonoscopy) to detect the earliest forms of colorectal cancer. Talk to your health care provider about this at age 7 when routine screening begins. A direct exam of the colon should be repeated every 5-10 years through age 44, unless early forms of precancerous polyps or small growths are found.  People who are at an increased risk for hepatitis B should be screened for this virus. You are considered at high risk for hepatitis B if:  You were born in a country where hepatitis B occurs often. Talk with your health care provider about which countries are considered high risk.  Your parents were born in a high-risk country and you  have not received a shot to protect against hepatitis B (hepatitis B vaccine).  You have HIV or AIDS.  You use needles to inject street drugs.  You live with, or have sex with, someone who has hepatitis B.  You are a man who has sex with other men (MSM).  You get hemodialysis treatment.  You take certain medicines for conditions like cancer, organ transplantation, and autoimmune conditions.  Hepatitis C blood testing is recommended for all people born from [birth year] through 1964 and any individual with known risk factors for hepatitis C.  Healthy men should no longer receive prostate-specific antigen (PSA) blood tests as part of routine cancer screening. Talk to your health care provider about prostate cancer screening.  Testicular cancer screening is not recommended for adolescents or adult males who have no symptoms. Screening includes self-exam, a  health care provider exam, and other screening tests. Consult with your health care provider about any symptoms you have or any concerns you have about testicular cancer.  Practice safe sex. Use condoms and avoid high-risk sexual practices to reduce the spread of sexually transmitted infections (STIs).  You should be screened for STIs, including gonorrhea and chlamydia if:  You are sexually active and are younger than 24 years.  You are older than 24 years, and your health care provider tells you that you are at risk for this type of infection.  Your sexual activity has changed since you were last screened, and you are at an increased risk for chlamydia or gonorrhea. Ask your health care provider if you are at risk.  If you are at risk of being infected with HIV, it is recommended that you take a prescription medicine daily to prevent HIV infection. This is called pre-exposure prophylaxis (PrEP). You are considered at risk if:  You are a man who has sex with other men (MSM).  You are a heterosexual man who is sexually active with multiple  partners.  You take drugs by injection.  You are sexually active with a partner who has HIV.  Talk with your health care provider about whether you are at high risk of being infected with HIV. If you choose to begin PrEP, you should first be tested for HIV. You should then be tested every 3 months for as long as you are taking PrEP.  Use sunscreen. Apply sunscreen liberally and repeatedly throughout the day. You should seek shade when your shadow is shorter than you. Protect yourself by wearing long sleeves, pants, a wide-brimmed hat, and sunglasses year round whenever you are outdoors.  Tell your health care provider of new moles or changes in moles, especially if there is a change in shape or color. Also, tell your health care provider if a mole is larger than the size of a pencil eraser.  A one-time screening for abdominal aortic aneurysm (AAA) and surgical repair of large AAAs by ultrasound is recommended for men aged 26-75 years who are current or former smokers.  Stay current with your vaccines (immunizations).   This information is not intended to replace advice given to you by your health care provider. Make sure you discuss any questions you have with your health care provider.   Document Released: 11/19/2007 Document Revised: 06/13/2014 Document Reviewed: 10/18/2010 Elsevier Interactive Patient Education [employer].

## 2015-12-24 NOTE — Progress Notes (Signed)
Pre visit review using our clinic review tool, if applicable. No additional management support is needed unless otherwise documented below in the visit note. 

## 2015-12-25 ENCOUNTER — Telehealth: Payer: Self-pay | Admitting: Internal Medicine

## 2015-12-25 DIAGNOSIS — Z129 Encounter for screening for malignant neoplasm, site unspecified: Secondary | ICD-10-CM

## 2015-12-25 NOTE — Telephone Encounter (Signed)
Placed referral to genetics for the counseling.

## 2015-12-25 NOTE — Telephone Encounter (Signed)
Do you know anything about genetic counseling that needs to be done?

## 2015-12-25 NOTE — Telephone Encounter (Signed)
[facility] is calling requesting that you contact them back about genetic testing req's for this patient.   Patients insurance is requiring genetic counseling before testing can be done

## 2015-12-30 ENCOUNTER — Ambulatory Visit: Payer: 59 | Admitting: *Deleted

## 2015-12-30 DIAGNOSIS — F4323 Adjustment disorder with mixed anxiety and depressed mood: Secondary | ICD-10-CM

## 2015-12-30 NOTE — BH Specialist Note (Signed)
[name] showed for his scheduled counseling appointment today.  Patient was oriented times four with good affect and dress.  Patient was alert and talkative.  Patient communicated that he feels stuck in life and is floundering. Patient shared that a relationship with his husband of 14 years (as a result of unfaithfulness on his partners part) ended a year ago yet the individual still lives in his home.  Counselor asked why that was and patient stated that he feels obligated to help him get on his feet.  Counselor reminded patient that it had been a year and how long did he feel he needed to help him out.  Counselor educated patient about developing boundaries.  Patient stated that he felt that was the problem.... no boundaries.  Counselor provided support and encouragement for patient as he shared openly and freely.  Counselor recommended that patient think about his goals and if the way he was living his life was moving him in the direction of accomplishing those goals.  Counselor assigned patient homework.  Patient was asked to think of why he was allowing the "ex" to continue living in his home...exactly what was he getting out of it that he did not ask him to leave and move on with his life after a year. Counselor will see patient in two weeks.   Rolena Infante, MA, LPC [facility]

## 2016-01-13 ENCOUNTER — Ambulatory Visit: Payer: 59 | Admitting: *Deleted

## 2016-01-27 ENCOUNTER — Ambulatory Visit: Payer: 59 | Admitting: *Deleted

## 2016-01-27 DIAGNOSIS — F411 Generalized anxiety disorder: Secondary | ICD-10-CM

## 2016-01-28 NOTE — BH Specialist Note (Signed)
[name] showed for his scheduled appointment today with counselor.  Patient was oriented times four with good affect and dress.  Patient appeared a little sleepy as evidenced by yawning. Patient well groomed.  Counselor inquired with patient as to how things were going.  Patient shared that things were about the same and that he had not been able to get up enough courage to ask his roommate (ex boyfriend) to move out.  As a result, patients anxiety level had not changed much.  Counselor asked patient what he was getting out of the situation by allowing him live there when they are not a coup el any longer. Counselor explained that people make decisions a lot of time based on what they get out of it.  Patient said that it was convenient to have him helping out with bills but that was about it.  Counselor shared with patient that it appeared his decision was made but he was having a difficult time implementing his decision.  Patient adamantly agreed. Counselor shared with patient that delaying the inevitable could pose bigger problems as time went on besides the fact that there is no closure.  Patient agreed and asked for suggestions as to how to initiate the conversation with his roommate. Counselor recommended that patient just be honest and suggested that he maybe role play with a trusted friend as to what he would say and how he would respond to the likely response.  Patient thanked counselor and shared that he knew what he needed to do but that it was harder to do than think about. Counselor provided support and encouragement accordingly.   Rolena Infante, MA, LPC [facility]

## 2016-02-02 ENCOUNTER — Telehealth: Payer: Self-pay | Admitting: Internal Medicine

## 2016-02-02 NOTE — Telephone Encounter (Signed)
[name] from [employer] is needing some information regarding the genetic tests. She needs to know if there is a family or personal history of cancer What kind it was and How old when they had it Can you please give her a call at [phone]. She will be out of office today and can you please call her tomorrow

## 2016-02-03 NOTE — Telephone Encounter (Signed)
States this is a second turn around time for sample and needs call back ASAP.

## 2016-02-04 NOTE — Telephone Encounter (Signed)
Yes, there is a family and personal history of cancer. In 2012 patient had non-hodgkins lymphoma and his paternal grandfather had lung cancer and his maternal aunt has breast cancer. The patient was 14 and his grandfather was 35 and his aunt was 66. Spoke with Altha Harm and gave her the information. She said she will pass the information along to the director and will process the results as soon as possible.

## 2016-02-04 NOTE — Telephone Encounter (Signed)
Left message on voicemail for patient to call me back.

## 2016-02-11 ENCOUNTER — Encounter (INDEPENDENT_AMBULATORY_CARE_PROVIDER_SITE_OTHER): Payer: 59 | Admitting: *Deleted

## 2016-02-11 ENCOUNTER — Ambulatory Visit: Payer: 59 | Admitting: *Deleted

## 2016-02-11 DIAGNOSIS — Z006 Encounter for examination for normal comparison and control in clinical research program: Secondary | ICD-10-CM

## 2016-02-11 LAB — VISTASEQ HERED. CANCER PANEL: PDF, VISTASEQ: 0

## 2016-02-11 NOTE — Progress Notes (Signed)
[name] is here for entry on the substudy of [facility], called Prepare which test frailty at yearly intervals. Informed consent was obtained after we discussed the contents of the consent and answered any questions he had. He denies any new problems or concerns. His next study appt. Is in October.

## 2016-03-02 ENCOUNTER — Ambulatory Visit: Payer: 59 | Admitting: *Deleted

## 2016-03-06 ENCOUNTER — Encounter: Payer: Self-pay | Admitting: Infectious Disease

## 2016-03-07 ENCOUNTER — Encounter: Payer: Self-pay | Admitting: *Deleted

## 2016-03-16 ENCOUNTER — Encounter (INDEPENDENT_AMBULATORY_CARE_PROVIDER_SITE_OTHER): Payer: 59 | Admitting: *Deleted

## 2016-03-16 VITALS — BP 115/79 | HR 62 | Temp 98.2°F | Resp 18 | Wt 163.5 lb

## 2016-03-16 DIAGNOSIS — Z006 Encounter for examination for normal comparison and control in clinical research program: Secondary | ICD-10-CM

## 2016-03-16 NOTE — Progress Notes (Signed)
[name] is here for his month 20 visit for Reprieve, A Randomized Trial to Prevent Vascular Events in HIV (study drug is Pitavastatin 4mg  or placebo). He denies any new problems or medications. He says he never misses any of his study meds. At his next visit for the study, he will need to go to [facility] for the CT scan of his heart. Arrangements will need to be made around the 1st of February. We will bring him in for the study visit first and get his labs in preparation for the scan.

## 2016-04-22 ENCOUNTER — Other Ambulatory Visit: Payer: Self-pay | Admitting: Infectious Disease

## 2016-04-22 DIAGNOSIS — E291 Testicular hypofunction: Secondary | ICD-10-CM

## 2016-04-27 ENCOUNTER — Other Ambulatory Visit: Payer: Self-pay | Admitting: Infectious Disease

## 2016-04-27 DIAGNOSIS — E291 Testicular hypofunction: Secondary | ICD-10-CM

## 2016-05-04 ENCOUNTER — Encounter: Payer: Self-pay | Admitting: Infectious Disease

## 2016-05-12 ENCOUNTER — Ambulatory Visit: Payer: 59 | Admitting: Internal Medicine

## 2016-05-17 ENCOUNTER — Ambulatory Visit: Payer: 59 | Admitting: Internal Medicine

## 2016-05-18 ENCOUNTER — Other Ambulatory Visit: Payer: 59

## 2016-05-18 ENCOUNTER — Other Ambulatory Visit (HOSPITAL_COMMUNITY)
Admission: RE | Admit: 2016-05-18 | Discharge: 2016-05-18 | Disposition: A | Discharge status: Home / Self Care | Payer: 59 | Source: Ambulatory Visit | Attending: Infectious Disease | Admitting: Infectious Disease

## 2016-05-18 DIAGNOSIS — Z113 Encounter for screening for infections with a predominantly sexual mode of transmission: Secondary | ICD-10-CM | POA: Diagnosis present

## 2016-05-18 DIAGNOSIS — B20 Human immunodeficiency virus [HIV] disease: Secondary | ICD-10-CM

## 2016-05-18 LAB — LIPID PANEL
Cholesterol: 116 mg/dL (ref <200)
HDL: 34 mg/dL — ABNORMAL LOW (ref >40)
LDL CALC: 68 mg/dL (ref <100)
Total CHOL/HDL Ratio: 3.4 Ratio (ref <5.0)
Triglycerides: 69 mg/dL (ref <150)
VLDL: 14 mg/dL (ref <30)

## 2016-05-18 LAB — URINE CYTOLOGY ANCILLARY ONLY
CHLAMYDIA, DNA PROBE: NEGATIVE
NEISSERIA GONORRHEA: NEGATIVE

## 2016-05-19 LAB — T-HELPER CELL (CD4) - (RCID CLINIC ONLY)
CD4 % Helper T Cell: 22 % — ABNORMAL LOW (ref 33–55)
CD4 T CELL ABS: 380 /uL — AB (ref 400–2700)

## 2016-05-19 LAB — RPR

## 2016-05-20 LAB — HIV-1 RNA QUANT-NO REFLEX-BLD: HIV 1 RNA Quant: <20 copies/mL (ref <20)

## 2016-06-08 ENCOUNTER — Ambulatory Visit: Payer: 59 | Admitting: Infectious Disease

## 2016-06-13 ENCOUNTER — Other Ambulatory Visit: Payer: Self-pay | Admitting: *Deleted

## 2016-06-13 ENCOUNTER — Ambulatory Visit (INDEPENDENT_AMBULATORY_CARE_PROVIDER_SITE_OTHER): Payer: 59 | Admitting: Infectious Disease

## 2016-06-13 VITALS — Wt 163.0 lb

## 2016-06-13 DIAGNOSIS — E291 Testicular hypofunction: Secondary | ICD-10-CM

## 2016-06-13 DIAGNOSIS — B20 Human immunodeficiency virus [HIV] disease: Secondary | ICD-10-CM

## 2016-06-13 DIAGNOSIS — F419 Anxiety disorder, unspecified: Secondary | ICD-10-CM | POA: Diagnosis not present

## 2016-06-13 DIAGNOSIS — Z23 Encounter for immunization: Secondary | ICD-10-CM

## 2016-06-13 MED ORDER — ABACAVIR-DOLUTEGRAVIR-LAMIVUD 600-50-300 MG PO TABS: 1.0000 | ORAL_TABLET | Freq: Every day | ORAL | 1 refills | Status: DC

## 2016-06-13 NOTE — Progress Notes (Signed)
Subjective:   Chief complaint:Her for follow-up of his HIV  Patient ID: [name], male    DOB: 1965-12-03, 51 y.o.   MRN: [id number]  HPI  [name] is a 51 y.o. male with HIV/AIDS who is doing well on his current ARV of [place] with controlled virus and healthy CD4.     Lab Results  Component Value Date   HIV1RNAQUANT <20 05/18/2016   HIV1RNAQUANT <20 12/09/2015   HIV1RNAQUANT 25 (H) 07/15/2015     Lab Results  Component Value Date   CD4TABS 380 (L) 05/18/2016   CD4TABS 650 12/09/2015   CD4TABS 570 07/15/2015    He and his husband are going through separation and divorce at present. They getting some resolution. [name] denied feeling depressed and did not want to be on antidepressant he was interested if Grayland Ormond might be available for counseling though.   Past Medical History:  Diagnosis Date  . ALLERGIC RHINITIS   . Anal fissure    Chronic, recurrent anal bleeding with pain  . Depression   . Grieving 12/09/2015  . HIV INFECTION 07/05/2010 dx  . 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 NEUTROPENIA, DRUG-INDUCED    on chemo early 2012  . 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 Problems in relationship with spouse or partner 12/09/2015  . 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 Thrombocytopenia ([place])    resolved  . Toe fracture, right 10/04/2012   great toe, nonsurgical mgmt Sharol Given)    Past Surgical History:  Procedure Laterality Date  . ANAL INTRAEPITHELIAL NEOPLASIA EXCISION  06/2011   wide exiscion of Sq cell ca  . LYMPH NODE BIOPSY  1975   L ingunial - nondx  . Monteggia fracture surgery  2010   L elbow  . NASAL SEPTUM SURGERY  2006  . Precancer lesion removed from anus  2012    Family History  Problem Relation Age of Onset  . Alcohol  abuse Mother   . Cancer Father 26    unknown primary  . Hypertension Mother   . Hyperlipidemia Brother   . Alcohol abuse Father   . Cirrhosis Father     alcohol  . Macular degeneration Brother     histoplasm  . Hypertension Sister 103    carotid blockage      Social History   Social History  . Marital status: Married    Spouse name: N/A  . Number of children: N/A  . Years of education: N/A   Social History Main Topics  . Smoking status: Former Smoker    Quit date: 07/22/2015  . Smokeless tobacco: Not on file  . Alcohol use 0.0 oz/week     Comment: occasional, socially  . Drug use: No  . Sexual activity: Yes     Comment: delined condoms   Other Topics Concern  . Not on file   Social History Narrative   Married - [name]   employed at [employer]    Allergies  Allergen Reactions  . Sulfonamide Derivatives     REACTION: rash, swelling, trouble breathing     Current Outpatient Prescriptions:  .  abacavir-dolutegravir-lamiVUDine (TRIUMEQ) 600-50-300 MG tablet, Take 1 tablet by mouth daily., Disp: 30 tablet, Rfl: 1 .  ALPRAZolam (XANAX) 0.25 MG tablet,  Take 1 tablet (0.25 mg total) by mouth 2 (two) times daily as needed for anxiety., Disp: 180 tablet, Rfl: 1 .  Pitavastatin Calcium 4 MG TABS, Take 1 tablet (4 mg total) by mouth daily., Disp: 30 tablet, Rfl: 11 .  sildenafil (VIAGRA) 100 MG tablet, Take 0.5-1 tablets (50-100 mg total) by mouth daily as needed for erectile dysfunction., Disp: 10 tablet, Rfl: 11 .  testosterone cypionate (DEPOTESTOTERONE CYPIONATE) 100 MG/ML injection, INJECT 1 ML INTO THE MUSCLE ONCE EACH WEEK, Disp: 10 mL, Rfl: 1    Review of Systems  Constitutional: Negative for activity change, appetite change, chills, diaphoresis, fatigue, fever and unexpected weight change.  HENT: Negative for rhinorrhea, sinus pressure, sneezing and sore throat.   Eyes: Negative for photophobia and visual disturbance.  Respiratory:  Negative for chest tightness and wheezing.   Cardiovascular: Negative for chest pain, palpitations and leg swelling.  Gastrointestinal: Negative for abdominal distention, anal bleeding, blood in stool, constipation and nausea.  Genitourinary: Negative for difficulty urinating, dysuria, flank pain and hematuria.  Musculoskeletal: Negative for arthralgias, back pain, gait problem, joint swelling and myalgias.  Skin: Negative for color change, pallor, rash and wound.  Neurological: Negative for dizziness, tremors, weakness and light-headedness.  Hematological: Negative for adenopathy. Does not bruise/bleed easily.  Psychiatric/Behavioral: Negative for agitation, behavioral problems, confusion, decreased concentration and sleep disturbance.       Objective:   Physical Exam  Constitutional: He is oriented to person, place, and time. He appears well-developed and well-nourished.  HENT:  Head: Normocephalic and atraumatic.  Eyes: Conjunctivae and EOM are normal.  Neck: Normal range of motion. Neck supple.  Cardiovascular: Normal rate and regular rhythm.   Pulmonary/Chest: Effort normal. No respiratory distress. He has no wheezes.  Abdominal: Soft. He exhibits no distension.  Musculoskeletal: Normal range of motion. He exhibits no edema or tenderness.  Neurological: He is alert and oriented to person, place, and time.  Skin: Skin is warm and dry. No rash noted. No erythema. No pallor.  Psychiatric: His behavior is normal. Judgment and thought content normal.          Assessment & Plan:     HIV: continue [place] clinic in 1 year for labs again will consider change to B-F-TAF after discussing ? 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 Surgery   Low testosterone: now on replacement therapy  STI screening:   rescreen from urine and extragenital sites when testing  I spent greater than 425 minutes with the patient including greater than 50% of time in face to face counsel ofrelationship stress, HIV rx options,  and in coordination of his care.

## 2016-06-13 NOTE — Patient Instructions (Signed)
We can get you set up with Grayland Ormond  WE want to give you a meningococcal vaccine today and in 2 months

## 2016-06-29 ENCOUNTER — Ambulatory Visit: Payer: 59

## 2016-07-08 ENCOUNTER — Encounter (INDEPENDENT_AMBULATORY_CARE_PROVIDER_SITE_OTHER): Payer: 59 | Admitting: *Deleted

## 2016-07-08 VITALS — BP 107/71 | HR 64 | Temp 97.6°F | Wt 161.0 lb

## 2016-07-08 DIAGNOSIS — Z006 Encounter for examination for normal comparison and control in clinical research program: Secondary | ICD-10-CM

## 2016-07-08 LAB — COMPREHENSIVE METABOLIC PANEL
ALT: 23 U/L (ref 9–46)
AST: 20 U/L (ref 10–40)
Albumin: 4.2 g/dL (ref 3.6–5.1)
Alkaline Phosphatase: 69 U/L (ref 40–115)
BUN: 20 mg/dL (ref 7–25)
CHLORIDE: 107 mmol/L (ref 98–110)
CO2: 29 mmol/L (ref 20–31)
Calcium: 9.2 mg/dL (ref 8.6–10.3)
Creat: 1.15 mg/dL (ref 0.60–1.35)
GLUCOSE: 81 mg/dL (ref 65–99)
Potassium: 4.5 mmol/L (ref 3.5–5.3)
SODIUM: 141 mmol/L (ref 135–146)
Total Bilirubin: 0.4 mg/dL (ref 0.2–1.2)
Total Protein: 7 g/dL (ref 6.1–8.1)

## 2016-07-08 NOTE — Progress Notes (Signed)
[name] is here today for month 24 visit for Reprieve, A Randomized Trial to Prevent Vascular Events in HIV (study drug is Pitavastatin 4mg  or placebo)  He denies any new problems or concerns, no muscle pain or weakness. He says his adherence is 100% with the study meds. He is to have a cardiac CT at [facility] for study within the next 2 weeks. He was instructed not to eat 4 hours before the scan, no caffeine or tobacco for 12 hours before and to hold his viagra and any NSAIDS for 5 days before the procedure.   The last consent for A5333s was obtained V3.1 dated [name] according to SOP guidelines before any procedures were done for this visit at 07:55am.  As soon as I have the scan scheduled I will let him know. He is to return in June for the next Reprieve visit.

## 2016-07-08 NOTE — Addendum Note (Signed)
Addended by: Georges Lynch C on: 07/08/2016 11:40 AM   Modules accepted: Orders

## 2016-07-12 LAB — CD4/CD8 (T-HELPER/T-SUPPRESSOR CELL)
CD4 COUNT: 380
CD4%: 25.3
CD8 T CELL ABS: 560
CD8 T CELL SUPPRESSOR: 37.3

## 2016-07-12 LAB — HIV-1 RNA QUANT-NO REFLEX-BLD
HIV 1 RNA Quant: <20 copies/mL — AB
HIV-1 RNA QUANT, LOG: DETECTED {Log_copies}/mL — AB

## 2016-07-18 ENCOUNTER — Encounter: Payer: Self-pay | Admitting: *Deleted

## 2016-07-18 ENCOUNTER — Telehealth: Payer: Self-pay | Admitting: *Deleted

## 2016-07-18 DIAGNOSIS — B20 Human immunodeficiency virus [HIV] disease: Secondary | ICD-10-CM

## 2016-07-18 MED ORDER — ABACAVIR-DOLUTEGRAVIR-LAMIVUD 600-50-300 MG PO TABS: 1.0000 | ORAL_TABLET | Freq: Every day | ORAL | 11 refills | Status: DC

## 2016-07-18 NOTE — Telephone Encounter (Signed)
-----   Message from Reggy Eye, [place] sent at 06/13/2016  4:21 PM EST ----- Send in 10 refills on his medications

## 2016-07-19 ENCOUNTER — Encounter: Payer: Self-pay | Admitting: Internal Medicine

## 2016-07-19 ENCOUNTER — Ambulatory Visit (INDEPENDENT_AMBULATORY_CARE_PROVIDER_SITE_OTHER): Payer: 59 | Admitting: Internal Medicine

## 2016-07-19 DIAGNOSIS — B372 Candidiasis of skin and nail: Secondary | ICD-10-CM | POA: Diagnosis not present

## 2016-07-19 MED ORDER — CLOTRIMAZOLE-BETAMETHASONE 1-0.05 % EX CREA: 1.0000 "application " | TOPICAL_CREAM | Freq: Two times a day (BID) | CUTANEOUS | 0 refills | Status: DC

## 2016-07-19 NOTE — Assessment & Plan Note (Signed)
With satellite lesions, rx for lotrisone cream to resolve.

## 2016-07-19 NOTE — Patient Instructions (Signed)
We have sent in lotrisone cream to use twice a day until the rash is gone.

## 2016-07-19 NOTE — Progress Notes (Signed)
Pre visit review using our clinic review tool, if applicable. No additional management support is needed unless otherwise documented below in the visit note. 

## 2016-07-19 NOTE — Progress Notes (Signed)
   Subjective:    Patient ID: [name], male    DOB: August 26, 1965, 51 y.o.   MRN: [id number]  HPI The patient is a 51 YO man coming in for rash on his backside. Started about 1-2 weeks ago as a small spot and has migrated to several other spots. Mild pain and some itching. Has tried not to scratch. Started as some dry skin. He tried triamcinolone cream he had leftover at home on the area which did not help. No fevers or chills. Does have HIV and last CD4 count and STD screening negative and CD4 adequate.   Review of Systems  Constitutional: Negative for activity change, appetite change, fatigue, fever and unexpected weight change.  Respiratory: Negative.   Cardiovascular: Negative.   Gastrointestinal: Negative.   Musculoskeletal: Negative.   Skin: Positive for rash.      Objective:   Physical Exam  Constitutional: He is oriented to person, place, and time. He appears well-developed and well-nourished.  HENT:  Head: Normocephalic and atraumatic.  Eyes: EOM are normal.  Cardiovascular: Normal rate and regular rhythm.   Pulmonary/Chest: Effort normal.  Abdominal: Soft.  Neurological: He is alert and oriented to person, place, and time.  Skin: Skin is warm and dry.  Red lesions on the right buttock with some satellite lesions   Vitals:   07/19/16 0909  BP: 122/76  Pulse: 66  Temp: 97.9 F (36.6 C)  TempSrc: Oral  SpO2: 99%  Weight: 160 lb 12 oz (72.9 kg)  Height: 5\' 9"  (1.753 m)      Assessment & Plan:

## 2016-07-21 ENCOUNTER — Encounter: Payer: 59 | Admitting: *Deleted

## 2016-07-21 ENCOUNTER — Other Ambulatory Visit: Payer: Self-pay | Admitting: *Deleted

## 2016-07-21 DIAGNOSIS — B20 Human immunodeficiency virus [HIV] disease: Secondary | ICD-10-CM

## 2016-07-21 DIAGNOSIS — Z006 Encounter for examination for normal comparison and control in clinical research program: Secondary | ICD-10-CM

## 2016-07-21 LAB — COMPREHENSIVE METABOLIC PANEL
ALT: 34 U/L (ref 9–46)
AST: 24 U/L (ref 10–40)
Albumin: 4.3 g/dL (ref 3.6–5.1)
Alkaline Phosphatase: 76 U/L (ref 40–115)
BUN: 15 mg/dL (ref 7–25)
CO2: 25 mmol/L (ref 20–31)
Calcium: 9.6 mg/dL (ref 8.6–10.3)
Chloride: 106 mmol/L (ref 98–110)
Creat: 0.97 mg/dL (ref 0.60–1.35)
Glucose, Bld: 74 mg/dL (ref 65–99)
POTASSIUM: 4.4 mmol/L (ref 3.5–5.3)
Sodium: 141 mmol/L (ref 135–146)
TOTAL PROTEIN: 7.3 g/dL (ref 6.1–8.1)
Total Bilirubin: 0.4 mg/dL (ref 0.2–1.2)

## 2016-07-21 MED ORDER — ABACAVIR-DOLUTEGRAVIR-LAMIVUD 600-50-300 MG PO TABS: 1.0000 | ORAL_TABLET | Freq: Every day | ORAL | 6 refills | Status: DC

## 2016-07-21 NOTE — Progress Notes (Signed)
Lyriq came by today to get the CMET drawn prior to CT next Friday. He was instructed on where to go for the scan and not to eat 4 hours before or drink caffeine/smoke 12 hours before. I also told him to not take any viagra or NSAIDS for 5 days prior to scan. He denied any new problems or concerns.

## 2016-07-29 ENCOUNTER — Telehealth: Payer: Self-pay | Admitting: Internal Medicine

## 2016-07-29 DIAGNOSIS — L989 Disorder of the skin and subcutaneous tissue, unspecified: Secondary | ICD-10-CM

## 2016-07-29 MED ORDER — NYSTATIN 100000 UNIT/GM EX CREA: 1.0000 "application " | TOPICAL_CREAM | Freq: Two times a day (BID) | CUTANEOUS | 0 refills | Status: DC

## 2016-07-29 NOTE — Telephone Encounter (Signed)
Please advise 

## 2016-07-29 NOTE — Telephone Encounter (Signed)
Patient called in said he was here 10 days ago. The cream Dr. Sharlet Salina does not seems to be working. He would like to talk to the nurse about it. Please follow up with him once you have time. Thank you.

## 2016-07-29 NOTE — Telephone Encounter (Signed)
Patient contacted and stated awareness and is going to try the cream again and if it does not improve them he will call back about dermatology

## 2016-07-29 NOTE — Telephone Encounter (Signed)
We can get him in with dermatology if he wants if the cream is not working but have sent in another cream in the meantime to try.

## 2016-08-01 ENCOUNTER — Encounter: Payer: Self-pay | Admitting: Internal Medicine

## 2016-08-01 DIAGNOSIS — R21 Rash and other nonspecific skin eruption: Secondary | ICD-10-CM

## 2016-08-02 ENCOUNTER — Other Ambulatory Visit: Payer: 59

## 2016-08-02 DIAGNOSIS — R21 Rash and other nonspecific skin eruption: Secondary | ICD-10-CM

## 2016-08-03 LAB — RPR

## 2016-08-10 ENCOUNTER — Other Ambulatory Visit: Payer: Self-pay | Admitting: Internal Medicine

## 2016-08-10 ENCOUNTER — Telehealth: Payer: Self-pay | Admitting: Internal Medicine

## 2016-08-10 DIAGNOSIS — B372 Candidiasis of skin and nail: Secondary | ICD-10-CM

## 2016-08-10 NOTE — Telephone Encounter (Signed)
He wants to go to dermatology

## 2016-08-10 NOTE — Telephone Encounter (Signed)
Needing to speak with nurse about the rash he was saw for in Feb. He did not want to give me any more information. Please follow up once you have time. Thank you.

## 2016-08-10 NOTE — Telephone Encounter (Signed)
Does he want to see a dermatologist or come back here for a recheck?

## 2016-08-10 NOTE — Telephone Encounter (Signed)
Patient was in back in February for a yeast infection on the skin, has been given a cream twice and states that its still not getting better, [name] is gone till Monday and cant put in referral for dermatology, please advise

## 2016-08-11 ENCOUNTER — Ambulatory Visit: Payer: 59

## 2016-08-12 ENCOUNTER — Encounter: Payer: Self-pay | Admitting: Internal Medicine

## 2016-08-15 NOTE — Telephone Encounter (Signed)
Patient informed the dermatologist referral in.

## 2016-08-18 ENCOUNTER — Encounter: Payer: Self-pay | Admitting: Internal Medicine

## 2016-08-23 MED ORDER — CLOTRIMAZOLE-BETAMETHASONE 1-0.05 % EX CREA: 1.0000 "application " | TOPICAL_CREAM | Freq: Two times a day (BID) | CUTANEOUS | 0 refills | Status: DC

## 2016-09-01 ENCOUNTER — Encounter (HOSPITAL_COMMUNITY): Payer: Self-pay | Admitting: Family Medicine

## 2016-09-01 ENCOUNTER — Ambulatory Visit (HOSPITAL_COMMUNITY)
Admission: EM | Admit: 2016-09-01 | Discharge: 2016-09-01 | Disposition: A | Discharge status: Home / Self Care | Payer: 59 | Attending: Family Medicine | Admitting: Family Medicine

## 2016-09-01 DIAGNOSIS — L739 Follicular disorder, unspecified: Secondary | ICD-10-CM | POA: Diagnosis not present

## 2016-09-01 MED ORDER — DOXYCYCLINE HYCLATE 100 MG PO CAPS
100.0000 mg | ORAL_CAPSULE | Freq: Two times a day (BID) | ORAL | 0 refills | Status: DC
Start: 2016-09-01 — End: 2017-03-29

## 2016-09-01 NOTE — ED Triage Notes (Signed)
Pt here for rash that has been going on for months. sts that he has been treated and tested for everything and has an appointment in April for dermatology. sts the rash is in genital area but moves around.

## 2016-09-01 NOTE — ED Provider Notes (Signed)
CSN: [id number]     Arrival date & time 09/01/16  1452 History   First MD Initiated Contact with Patient 09/01/16 1526     Chief Complaint  Patient presents with  . Rash   (Consider location/radiation/quality/duration/timing/severity/associated sxs/prior Treatment) Patient is having ongoing rash for months.  He has been having rash starting as pustule and moving all over body.  He has an appointment with Dermatology.  He states he has had recent STD work up that was negative.   The history is provided by the patient.  Rash  Location:  Pelvis Pelvic rash location:  Pelvis and perineum Quality: blistering and itchiness   Severity:  Mild Onset quality:  Sudden Duration:  1 month Timing:  Constant Chronicity:  New   Past Medical History:  Diagnosis Date  . ALLERGIC RHINITIS   . Anal fissure    Chronic, recurrent anal bleeding with pain  . Depression   . Grieving 12/09/2015  . HIV INFECTION 07/05/2010 dx  . 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 NEUTROPENIA, DRUG-INDUCED    on chemo early 2012  . 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 Problems in relationship with spouse or partner 12/09/2015  . 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 Thrombocytopenia ([place])    resolved  . Toe fracture, right 10/04/2012   great toe, nonsurgical mgmt Sharol Given)   Past Surgical History:  Procedure Laterality Date  . ANAL INTRAEPITHELIAL NEOPLASIA EXCISION  06/2011   wide exiscion of Sq cell ca  . LYMPH NODE BIOPSY  1975   L ingunial - nondx  . Monteggia fracture surgery  2010   L elbow  . NASAL SEPTUM SURGERY  2006  . Precancer lesion removed from anus  2012   Family History  Problem Relation Age of Onset  . Cancer Father 79    unknown primary  . Alcohol abuse Father   . Cirrhosis  Father     alcohol  . Alcohol abuse Mother   . Hypertension Mother   . Hyperlipidemia Brother   . Macular degeneration Brother     histoplasm  . Hypertension Sister 8    carotid blockage   Social History  Substance Use Topics  . Smoking status: Former Smoker    Quit date: 07/22/2015  . Smokeless tobacco: Never Used  . Alcohol use 0.0 oz/week     Comment: occasional, socially    Review of Systems  Constitutional: Negative.   HENT: Negative.   Eyes: Negative.   Respiratory: Negative.   Cardiovascular: Negative.   Gastrointestinal: Negative.   Endocrine: Negative.   Genitourinary: Negative.   Musculoskeletal: Negative.   Skin: Positive for rash.  Allergic/Immunologic: Negative.   Neurological: Negative.   Hematological: Negative.   Psychiatric/Behavioral: Negative.     Allergies  Sulfonamide derivatives  Home Medications   Prior to Admission medications   Medication Sig Start Date End Date Taking? Authorizing Provider  abacavir-dolutegravir-lamiVUDine (TRIUMEQ) 600-50-300 MG tablet Take 1 tablet by mouth daily. 07/21/16   Truman Hayward, MD  ALPRAZolam Duanne Moron) 0.25 MG tablet Take 1 tablet (0.25 mg total) by mouth 2 (two) times daily as needed for anxiety. 12/24/15   Hoyt Koch, MD  clotrimazole-betamethasone (LOTRISONE) cream Apply 1 application topically 2 (two) times daily. 08/23/16  Hoyt Koch, MD  doxycycline (VIBRAMYCIN) 100 MG capsule Take 1 capsule (100 mg total) by mouth 2 (two) times daily. 09/01/16   Lysbeth Penner, FNP  nystatin cream (MYCOSTATIN) Apply 1 application topically 2 (two) times daily. 07/29/16   Hoyt Koch, MD  Pitavastatin Calcium 4 MG TABS Take 1 tablet (4 mg total) by mouth daily. 07/18/14   Truman Hayward, MD  sildenafil (VIAGRA) 100 MG tablet Take 0.5-1 tablets (50-100 mg total) by mouth daily as needed for erectile dysfunction. 12/24/15   Hoyt Koch, MD  testosterone cypionate (DEPOTESTOTERONE  CYPIONATE) 100 MG/ML injection INJECT 1 ML INTO THE MUSCLE ONCE EACH WEEK 05/03/16   Truman Hayward, MD   Meds Ordered and Administered this Visit  Medications - No data to display  BP 121/73   Pulse 66   Temp 98.4 F (36.9 C) (Oral)   Resp 18   SpO2 100%  No data found.   Physical Exam  Constitutional: He appears well-developed and well-nourished.  HENT:  Head: Normocephalic and atraumatic.  Right Ear: External ear normal.  Left Ear: External ear normal.  Mouth/Throat: Oropharynx is clear and moist.  Eyes: Conjunctivae and EOM are normal. Pupils are equal, round, and reactive to light.  Neck: Normal range of motion. Neck supple.  Cardiovascular: Normal rate, regular rhythm and normal heart sounds.   Pulmonary/Chest: Effort normal and breath sounds normal.  Skin:  White pustules on right and left groin area.  Nursing note and vitals reviewed.   Urgent Care Course     Procedures (including critical care time)  Labs Review Labs Reviewed - No data to display  Imaging Review No results found.   Visual Acuity Review  Right Eye Distance:   Left Eye Distance:   Bilateral Distance:    Right Eye Near:   Left Eye Near:    Bilateral Near:         MDM   1. Folliculitis    Doxycycline 100mg  one po bid x 10 days #20      Lysbeth Penner, FNP 09/01/16 1547

## 2016-09-12 ENCOUNTER — Encounter: Payer: Self-pay | Admitting: Internal Medicine

## 2016-09-12 ENCOUNTER — Other Ambulatory Visit: Payer: Self-pay | Admitting: *Deleted

## 2016-09-14 ENCOUNTER — Telehealth: Payer: Self-pay | Admitting: *Deleted

## 2016-09-14 DIAGNOSIS — R7989 Other specified abnormal findings of blood chemistry: Secondary | ICD-10-CM

## 2016-09-14 NOTE — Telephone Encounter (Signed)
Prior Authorization started via Cover My Meds for Testosterone Cypionate.  RN needed the patient's prescription card ID number to submit the request.  Prescription ID # [id number]. Landis Gandy, RN

## 2016-09-23 NOTE — Telephone Encounter (Signed)
Authorization denied, insurance company requesting new labs for continuation of injected testosterone. RN left message for patient, mychart message sent. Landis Gandy, RN

## 2016-09-23 NOTE — Telephone Encounter (Signed)
He should come in early am for repeat testocerone level, CBC w diff and BMP for the testosterone justificaiton and safety labbs

## 2016-09-26 NOTE — Telephone Encounter (Signed)
Orders placed, patient will come 4/25. Please cosign if labs are correct. Thanks!

## 2016-09-26 NOTE — Addendum Note (Signed)
Addended by: Landis Gandy on: 09/26/2016 01:23 PM   Modules accepted: Orders

## 2016-09-28 ENCOUNTER — Ambulatory Visit (INDEPENDENT_AMBULATORY_CARE_PROVIDER_SITE_OTHER): Payer: 59 | Admitting: *Deleted

## 2016-09-28 ENCOUNTER — Other Ambulatory Visit: Payer: 59

## 2016-09-28 DIAGNOSIS — B20 Human immunodeficiency virus [HIV] disease: Secondary | ICD-10-CM | POA: Diagnosis not present

## 2016-09-28 DIAGNOSIS — Z23 Encounter for immunization: Secondary | ICD-10-CM | POA: Diagnosis not present

## 2016-09-28 DIAGNOSIS — R7989 Other specified abnormal findings of blood chemistry: Secondary | ICD-10-CM

## 2016-09-28 LAB — CBC WITH DIFFERENTIAL/PLATELET
BASOS PCT: 0 %
Basophils Absolute: 0 cells/uL (ref 0–200)
EOS PCT: 2 %
Eosinophils Absolute: 148 cells/uL (ref 15–500)
HCT: 46.5 % (ref 38.5–50.0)
HEMOGLOBIN: 15.7 g/dL (ref 13.2–17.1)
LYMPHS ABS: 1702 {cells}/uL (ref 850–3900)
LYMPHS PCT: 23 %
MCH: 34.5 pg — ABNORMAL HIGH (ref 27.0–33.0)
MCHC: 33.8 g/dL (ref 32.0–36.0)
MCV: 102.2 fL — ABNORMAL HIGH (ref 80.0–100.0)
MPV: 10.5 fL (ref 7.5–12.5)
Monocytes Absolute: 518 cells/uL (ref 200–950)
Monocytes Relative: 7 %
NEUTROS PCT: 68 %
Neutro Abs: 5032 cells/uL (ref 1500–7800)
Platelets: 195 10*3/uL (ref 140–400)
RBC: 4.55 MIL/uL (ref 4.20–5.80)
RDW: 13.9 % (ref 11.0–15.0)
WBC: 7.4 10*3/uL (ref 3.8–10.8)

## 2016-09-28 LAB — BASIC METABOLIC PANEL
BUN: 18 mg/dL (ref 7–25)
CO2: 23 mmol/L (ref 20–31)
Calcium: 9.3 mg/dL (ref 8.6–10.3)
Chloride: 108 mmol/L (ref 98–110)
Creat: 1.08 mg/dL (ref 0.60–1.35)
Glucose, Bld: 92 mg/dL (ref 65–99)
POTASSIUM: 4.5 mmol/L (ref 3.5–5.3)
SODIUM: 138 mmol/L (ref 135–146)

## 2016-09-29 ENCOUNTER — Other Ambulatory Visit: Payer: Self-pay | Admitting: *Deleted

## 2016-09-29 DIAGNOSIS — E291 Testicular hypofunction: Secondary | ICD-10-CM

## 2016-09-29 LAB — TESTOSTERONE TOTAL,FREE,BIO, MALES
ALBUMIN: 4.2 g/dL (ref 3.6–5.1)
Sex Hormone Binding: 16 nmol/L (ref 10–50)
Testosterone: 163 ng/dL — ABNORMAL LOW (ref 250–827)

## 2016-09-29 MED ORDER — TESTOSTERONE CYPIONATE 100 MG/ML IM SOLN: INTRAMUSCULAR | 5 refills | Status: DC

## 2016-09-29 NOTE — Telephone Encounter (Signed)
        Prior Authorization Approved through 09/29/17 per [facility]

## 2016-10-12 ENCOUNTER — Other Ambulatory Visit: Payer: Self-pay | Admitting: *Deleted

## 2016-10-12 MED ORDER — ALPRAZOLAM 0.25 MG PO TABS: 0.2500 mg | ORAL_TABLET | Freq: Two times a day (BID) | ORAL | 1 refills | Status: DC | PRN

## 2016-10-12 NOTE — Telephone Encounter (Signed)
Faxed script to [facility]...Johny Chess

## 2016-11-04 ENCOUNTER — Encounter (INDEPENDENT_AMBULATORY_CARE_PROVIDER_SITE_OTHER): Payer: 59 | Admitting: *Deleted

## 2016-11-04 VITALS — BP 103/70 | HR 69 | Temp 98.4°F | Wt 169.8 lb

## 2016-11-04 DIAGNOSIS — Z006 Encounter for examination for normal comparison and control in clinical research program: Secondary | ICD-10-CM

## 2016-11-04 NOTE — Progress Notes (Signed)
Story is here for his month 28 visit for Reprieve, A Randomized Trial to Prevent Vascular Events in HIV (study drug is Pitavastatin 4mg  or placebo).  He denies any new problems or concerns. He says his adherence is excellent and he denies any muscle aches or weakness. He will return on 9/28 for the next     study visit.

## 2016-12-05 ENCOUNTER — Encounter: Payer: Self-pay | Admitting: Infectious Disease

## 2016-12-08 ENCOUNTER — Other Ambulatory Visit (HOSPITAL_COMMUNITY)
Admission: RE | Admit: 2016-12-08 | Discharge: 2016-12-08 | Disposition: A | Discharge status: Home / Self Care | Payer: 59 | Source: Ambulatory Visit | Attending: Infectious Disease | Admitting: Infectious Disease

## 2016-12-08 ENCOUNTER — Ambulatory Visit: Payer: 59 | Admitting: Infectious Disease

## 2016-12-08 ENCOUNTER — Other Ambulatory Visit: Payer: 59

## 2016-12-08 DIAGNOSIS — B20 Human immunodeficiency virus [HIV] disease: Secondary | ICD-10-CM | POA: Diagnosis present

## 2016-12-08 LAB — LIPID PANEL
Cholesterol: 135 mg/dL (ref <200)
HDL: 35 mg/dL — AB (ref >40)
LDL CALC: 85 mg/dL (ref <100)
TRIGLYCERIDES: 77 mg/dL (ref <150)
Total CHOL/HDL Ratio: 3.9 Ratio (ref <5.0)
VLDL: 15 mg/dL (ref <30)

## 2016-12-08 LAB — COMPLETE METABOLIC PANEL WITH GFR
ALT: 22 U/L (ref 9–46)
AST: 18 U/L (ref 10–40)
Albumin: 4.2 g/dL (ref 3.6–5.1)
Alkaline Phosphatase: 66 U/L (ref 40–115)
BUN: 17 mg/dL (ref 7–25)
CHLORIDE: 108 mmol/L (ref 98–110)
CO2: 22 mmol/L (ref 20–31)
Calcium: 9.1 mg/dL (ref 8.6–10.3)
Creat: 1.08 mg/dL (ref 0.60–1.35)
GFR, EST NON AFRICAN AMERICAN: 80 mL/min (ref >=60)
GFR, Est African American: >89 mL/min (ref >=60)
GLUCOSE: 85 mg/dL (ref 65–99)
POTASSIUM: 4.6 mmol/L (ref 3.5–5.3)
SODIUM: 139 mmol/L (ref 135–146)
Total Bilirubin: 0.6 mg/dL (ref 0.2–1.2)
Total Protein: 6.7 g/dL (ref 6.1–8.1)

## 2016-12-08 LAB — CBC WITH DIFFERENTIAL/PLATELET
BASOS ABS: 86 {cells}/uL (ref 0–200)
Basophils Relative: 1 %
EOS PCT: 1 %
Eosinophils Absolute: 86 cells/uL (ref 15–500)
HCT: 49.1 % (ref 38.5–50.0)
Hemoglobin: 16.4 g/dL (ref 13.2–17.1)
Lymphocytes Relative: 21 %
Lymphs Abs: 1806 cells/uL (ref 850–3900)
MCH: 34.7 pg — AB (ref 27.0–33.0)
MCHC: 33.4 g/dL (ref 32.0–36.0)
MCV: 104 fL — ABNORMAL HIGH (ref 80.0–100.0)
MONOS PCT: 7 %
MPV: 10.2 fL (ref 7.5–12.5)
Monocytes Absolute: 602 cells/uL (ref 200–950)
NEUTROS ABS: 6020 {cells}/uL (ref 1500–7800)
NEUTROS PCT: 70 %
PLATELETS: 189 10*3/uL (ref 140–400)
RBC: 4.72 MIL/uL (ref 4.20–5.80)
RDW: 13.4 % (ref 11.0–15.0)
WBC: 8.6 10*3/uL (ref 3.8–10.8)

## 2016-12-09 LAB — T-HELPER CELL (CD4) - (RCID CLINIC ONLY)
CD4 T CELL ABS: 390 /uL — AB (ref 400–2700)
CD4 T CELL HELPER: 22 % — AB (ref 33–55)

## 2016-12-09 LAB — URINE CYTOLOGY ANCILLARY ONLY
CHLAMYDIA, DNA PROBE: NEGATIVE
NEISSERIA GONORRHEA: NEGATIVE

## 2016-12-09 LAB — RPR

## 2016-12-09 LAB — HEPATITIS C ANTIBODY: HCV Ab: NEGATIVE

## 2016-12-12 LAB — HIV-1 RNA QUANT-NO REFLEX-BLD
HIV 1 RNA QUANT: NOT DETECTED {copies}/mL
HIV-1 RNA Quant, Log: <1.3 Log copies/mL

## 2017-01-06 ENCOUNTER — Encounter: Payer: Self-pay | Admitting: Internal Medicine

## 2017-01-10 ENCOUNTER — Telehealth: Payer: Self-pay

## 2017-01-10 ENCOUNTER — Other Ambulatory Visit: Payer: Self-pay

## 2017-01-10 MED ORDER — SILDENAFIL CITRATE 100 MG PO TABS: 50.0000 mg | ORAL_TABLET | Freq: Every day | ORAL | 1 refills | Status: DC | PRN

## 2017-01-10 NOTE — Telephone Encounter (Signed)
Re-sent prescription to [facility] mail order pharmacy, But I only sent 10 tablets with 1 refill because he needs to be seen before any further refills will be given. Last seen 12/24/2015.

## 2017-01-10 NOTE — Telephone Encounter (Signed)
Please resend to [facility] pharmacy Pt requested 90 day supply

## 2017-02-13 ENCOUNTER — Ambulatory Visit (INDEPENDENT_AMBULATORY_CARE_PROVIDER_SITE_OTHER): Payer: 59 | Admitting: Infectious Disease

## 2017-02-13 ENCOUNTER — Encounter: Payer: Self-pay | Admitting: Infectious Disease

## 2017-02-13 VITALS — BP 121/77 | HR 60 | Temp 98.7°F | Wt 170.0 lb

## 2017-02-13 DIAGNOSIS — B20 Human immunodeficiency virus [HIV] disease: Secondary | ICD-10-CM | POA: Diagnosis not present

## 2017-02-13 DIAGNOSIS — Z0000 Encounter for general adult medical examination without abnormal findings: Secondary | ICD-10-CM | POA: Diagnosis not present

## 2017-02-13 DIAGNOSIS — Z23 Encounter for immunization: Secondary | ICD-10-CM

## 2017-02-13 DIAGNOSIS — E291 Testicular hypofunction: Secondary | ICD-10-CM

## 2017-02-13 MED ORDER — ABACAVIR-DOLUTEGRAVIR-LAMIVUD 600-50-300 MG PO TABS: 1.0000 | ORAL_TABLET | Freq: Every day | ORAL | 11 refills | Status: DC

## 2017-02-13 NOTE — Progress Notes (Signed)
Subjective:   Chief complaint:Her for follow-up of his HIV  Patient ID: [name], male    DOB: 1965/07/13, 51 y.o.   MRN: [id number]  HPI  [name] is a 51 y.o. male with HIV/AIDS who is doing well on his current ARV of [place] with controlled virus and healthy CD4.     Lab Results  Component Value Date   HIV1RNAQUANT <20 NOT DETECTED 12/08/2016   HIV1RNAQUANT <20 DETECTED (A) 07/08/2016   HIV1RNAQUANT <20 05/18/2016     Lab Results  Component Value Date   CD4TABS 390 (L) 12/08/2016   CD4TABS 380 (L) 05/18/2016   CD4TABS 650 12/09/2015    He has no complaints today and is in good spirits we discussed various options for ARV therapy 1 to proceed with continuation of TRIUMEQ would like to check his testosterone again and Oser check a PSA   Past Medical History:  Diagnosis Date  . ALLERGIC RHINITIS   . Anal fissure    Chronic, recurrent anal bleeding with pain  . Depression   . Grieving 12/09/2015  . HIV INFECTION 07/05/2010 dx  . 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 NEUTROPENIA, DRUG-INDUCED    on chemo early 2012  . 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 Problems in relationship with spouse or partner 12/09/2015  . 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 Thrombocytopenia ([place])    resolved  . Toe fracture, right 10/04/2012   great toe, nonsurgical mgmt Sharol Given)    Past Surgical History:  Procedure Laterality Date  . ANAL INTRAEPITHELIAL NEOPLASIA EXCISION  06/2011   wide exiscion of Sq cell ca  . LYMPH NODE BIOPSY  1975   L ingunial - nondx  . [place] fracture surgery  2010   L elbow  . NASAL SEPTUM SURGERY  2006  . Precancer lesion removed from anus  2012    Family History  Problem Relation Age of Onset  . Cancer Father 71   unknown primary  . Alcohol abuse Father   . Cirrhosis Father        alcohol  . Alcohol abuse Mother   . Hypertension Mother   . Hyperlipidemia Brother   . Macular degeneration Brother        histoplasm  . Hypertension Sister 74       carotid blockage      Social History   Social History  . Marital status: Married    Spouse name: N/A  . Number of children: N/A  . Years of education: N/A   Social History Main Topics  . Smoking status: Former Smoker    Quit date: 07/22/2015  . Smokeless tobacco: Never Used  . Alcohol use 0.0 oz/week     Comment: occasional, socially  . Drug use: No  . Sexual activity: Yes     Comment: delined condoms   Other Topics Concern  . Not on file   Social History Narrative   Married - Leonardo   employed at [employer]    Allergies  Allergen Reactions  . Sulfonamide Derivatives     REACTION: rash, swelling, trouble breathing     Current Outpatient Prescriptions:  .  abacavir-dolutegravir-lamiVUDine (TRIUMEQ) 600-50-300 MG tablet, Take 1 tablet by mouth daily., Disp: 30 tablet, Rfl: 6 .  ALPRAZolam (XANAX) 0.25 MG tablet, Take 1 tablet (0.25 mg total) by mouth 2 (two) times daily as needed for anxiety., Disp: 180 tablet, Rfl: 1 .  clotrimazole-betamethasone (LOTRISONE) cream, Apply 1 application topically 2 (two) times daily., Disp: 45 g, Rfl: 0 .  doxycycline (VIBRAMYCIN) 100 MG capsule, Take 1 capsule (100 mg total) by mouth 2 (two) times daily., Disp: 20 capsule, Rfl: 0 .  nystatin cream (MYCOSTATIN), Apply 1 application topically 2 (two) times daily., Disp: 30 g, Rfl: 0 .  Pitavastatin Calcium 4 MG TABS, Take 1 tablet (4 mg total) by mouth daily., Disp: 30 tablet, Rfl: 11 .  sildenafil (VIAGRA) 100 MG tablet, Take 0.5-1 tablets (50-100 mg total) by mouth daily as needed for erectile dysfunction. Needs office visit for further refills, Disp: 10 tablet, Rfl: 1 .  testosterone cypionate (DEPOTESTOTERONE CYPIONATE) 100  MG/ML injection, INJECT 1 ML INTO THE MUSCLE ONCE EACH WEEK, Disp: 40 mL, Rfl: 5    Review of Systems  Constitutional: Negative for activity change, appetite change, chills, diaphoresis, fatigue, fever and unexpected weight change.  HENT: Negative for rhinorrhea, sinus pressure, sneezing and sore throat.   Eyes: Negative for visual disturbance.  Respiratory: Negative for chest tightness and wheezing.   Cardiovascular: Negative for chest pain, palpitations and leg swelling.  Gastrointestinal: Negative for abdominal distention, anal bleeding, blood in stool, constipation and nausea.  Genitourinary: Negative for difficulty urinating, dysuria, flank pain and hematuria.  Musculoskeletal: Negative for arthralgias, back pain, gait problem, joint swelling and myalgias.  Skin: Negative for color change, pallor, rash and wound.  Neurological: Negative for dizziness, tremors, weakness and light-headedness.  Hematological: Negative for adenopathy. Does not bruise/bleed easily.  Psychiatric/Behavioral: Negative for agitation, behavioral problems, confusion, decreased concentration and sleep disturbance.       Objective:   Physical Exam  Constitutional: He is oriented to person, place, and time. He appears well-developed and well-nourished.  HENT:  Head: Normocephalic and atraumatic.  Eyes: Conjunctivae and EOM are normal.  Neck: Normal range of motion. Neck supple.  Cardiovascular: Normal rate and regular rhythm.   Pulmonary/Chest: Effort normal. No respiratory distress. He has no wheezes.  Abdominal: Soft. He exhibits no distension.  Musculoskeletal: Normal range of motion. He exhibits no edema or tenderness.  Neurological: He is alert and oriented to person, place, and time.  Skin: Skin is warm and dry. No rash noted. No erythema. No pallor.  Psychiatric: He has a normal mood and affect. His behavior is normal. Judgment and thought content normal.          Assessment & Plan:     HIV:  continue TRIUMEQ  Recheck labs in 6 months time with visit. 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 Surgery   Low testosterone: now on replacement therapy, recheck a level and also check a PSA to ensure that there is no underlying prostate problem we could make worse with testosterone replacement  STI screening:  rescreen from urine and extragenital sites when testing  I spent greater than 25 minutes with the patient including greater than 50% of time in face to face counsel ofrelationship stress, HIV rx options, low testosterone and in coordination of his care.

## 2017-02-14 LAB — TESTOSTERONE TOTAL,FREE,BIO, MALES
ALBUMIN MSPROF: 4.3 g/dL (ref 3.6–5.1)
Sex Hormone Binding: 21 nmol/L (ref 10–50)
TESTOSTERONE: 162 ng/dL — AB (ref 250–827)

## 2017-02-14 LAB — PSA: PSA: 1.2 ng/mL (ref <=4.0)

## 2017-02-15 ENCOUNTER — Encounter: Payer: Self-pay | Admitting: Infectious Disease

## 2017-02-15 ENCOUNTER — Other Ambulatory Visit: Payer: Self-pay | Admitting: Infectious Disease

## 2017-02-15 ENCOUNTER — Other Ambulatory Visit: Payer: Self-pay | Admitting: *Deleted

## 2017-02-15 DIAGNOSIS — E291 Testicular hypofunction: Secondary | ICD-10-CM

## 2017-02-15 MED ORDER — TESTOSTERONE CYPIONATE 100 MG/ML IM SOLN: INTRAMUSCULAR | 5 refills | Status: DC

## 2017-02-15 NOTE — Progress Notes (Signed)
Notified patient, called [facility] and left message with new RX and refills. Lab appointment for 2 months.

## 2017-02-15 NOTE — Progress Notes (Signed)
Pts testosterone NOT where we want. Should increase dose to 1.5 ml = 150mg  per week  Can then check another level in next 2 months

## 2017-03-03 ENCOUNTER — Encounter (INDEPENDENT_AMBULATORY_CARE_PROVIDER_SITE_OTHER): Payer: Self-pay | Admitting: *Deleted

## 2017-03-03 VITALS — BP 109/74 | HR 69 | Temp 98.1°F | Wt 170.5 lb

## 2017-03-03 DIAGNOSIS — Z006 Encounter for examination for normal comparison and control in clinical research program: Secondary | ICD-10-CM

## 2017-03-03 NOTE — Progress Notes (Signed)
[name] is here for his month 72 visit for Reprieve, A Randomized Trial to Prevent Vascular Events in HIV (study drug is Pitavastatin 4mg  or placebo).  He said he just got over the flu, he came down with symptoms around the 7th and did get Tamiflu. He denies any other problems or medications. He did have his flushot 4 days before he came down with it. He says his adherence is excellent with his study drug and HIV med. He will be returning in January for the next study visit.

## 2017-03-10 ENCOUNTER — Encounter: Payer: 59 | Admitting: Internal Medicine

## 2017-03-21 ENCOUNTER — Encounter: Payer: Self-pay | Admitting: Nurse Practitioner

## 2017-03-21 ENCOUNTER — Ambulatory Visit (INDEPENDENT_AMBULATORY_CARE_PROVIDER_SITE_OTHER): Payer: 59 | Admitting: Nurse Practitioner

## 2017-03-21 VITALS — BP 110/70 | HR 65 | Temp 98.3°F | Ht 69.0 in | Wt 170.0 lb

## 2017-03-21 DIAGNOSIS — A630 Anogenital (venereal) warts: Secondary | ICD-10-CM | POA: Diagnosis not present

## 2017-03-21 MED ORDER — IMIQUIMOD 5 % EX CREA: TOPICAL_CREAM | CUTANEOUS | 0 refills | Status: DC

## 2017-03-21 NOTE — Patient Instructions (Signed)
Please discuss with infectious disease about need for rectal swab to identify HPV grade.   Genital Warts Genital warts are a common STD (sexually transmitted disease). They may appear as small bumps on the tissues of the genital area or anal area. Sometimes, they can become irritated and cause pain. Genital warts are easily passed to other people through sexual contact. Getting treatment is important because genital warts can lead to other problems. In females, the virus that causes genital warts may increase the risk of cervical cancer. What are the causes? Genital warts are caused by a virus that is called human papillomavirus (HPV). HPV is spread by having unprotected sex with an infected person. It can be spread through vaginal, anal, and oral sex. Many people do not know that they are infected. They may be infected for years without problems. However, even if they do not have problems, they can pass the infection to their sexual partners. What increases the risk? Genital warts are more likely to develop in:  People who have unprotected sex.  People who have multiple sexual partners.  People who become sexually active before they are 50 years of age.  Men who are not circumcised.  Women who have a male sexual partner who is not circumcised.  People who have a weakened body defense system (immune system) due to disease or medicine.  People who smoke.  What are the signs or symptoms? Symptoms of genital warts include:  Small growths in the genital area or anal area. These warts often grow in clusters.  Itching and irritation in the genital area or anal area.  Bleeding from the warts.  Painful sexual intercourse.  How is this diagnosed? Genital warts can usually be diagnosed from their appearance on the vagina, vulva, penis, perineum, anus, or rectum. Tests may also be done, such as:  Biopsy. A tissue sample is removed so it can be looked at under a microscope.  Colposcopy.  In females, a magnifying tool is used to examine the vagina and cervix. Certain solutions may be used to make the HPV cells change color so they can be seen more easily.  A Pap test in females.  Tests for other STDs.  How is this treated? Treatment for genital warts may include:  Applying prescription medicines to the warts. These may be solutions or creams.  Freezing the warts with liquid nitrogen (cryotherapy).  Burning the warts with: ? Laser treatment. ? An electrified probe (electrocautery).  Injecting a substance (Candida antigen or Trichophyton antigen) into the warts to help the body's immune system to fight off the warts.  Interferon injections.  Surgery to remove the warts.  Follow these instructions at home: Medicines  Apply over-the-counter and prescription medicines only as told by your health care provider.  Do not treat genital warts with medicines that are used for treating hand warts.  Talk with your health care provider about using over-the-counter anti-itch creams. General instructions  Do not touch or scratch the warts.  Do not have sex until your treatment has been completed.  Tell your current and past sexual partners about your condition because they may also need treatment.  Keep all follow-up visits as told by your health care provider. This is important.  After treatment, use condoms during sex to prevent future infections. Other Instructions for Women  Women who have genital warts might need increased screening for cervical cancer. This type of cancer is slow growing and can be cured if it is found early. Chances  of developing cervical cancer are increased with HPV.  If you become pregnant, tell your health care provider that you have had HPV. Your health care provider will monitor you closely during pregnancy to be sure that your baby is safe. How is this prevented? Talk with your health care provider about getting the HPV vaccines. These  vaccines prevent some HPV infections and cancers. It is recommended that the vaccine be given to males and females who are 2-3 years of age. It will not work if you already have HPV, and it is not recommended for pregnant women. Contact a health care provider if:  You have redness, swelling, or pain in the area of the treated skin.  You have a fever.  You feel generally ill.  You feel lumps in and around your genital area or anal area.  You have bleeding in your genital area or anal area.  You have pain during sexual intercourse. This information is not intended to replace advice given to you by your health care provider. Make sure you discuss any questions you have with your health care provider. Document Released: 05/20/2000 Document Revised: 10/29/2015 Document Reviewed: 08/18/2014 Elsevier Interactive Patient Education  [employer].

## 2017-03-21 NOTE — Progress Notes (Signed)
Subjective:  Patient ID: [name], male    DOB: [birth year]  Age: 51 y.o. MRN: [id number]  CC: Hemorrhoids (feel like hemorrhoids--going on for 4 wks. thinking it might be something else)   Other  This is a new problem. The current episode started 1 to 4 weeks ago. The problem occurs constantly. The problem has been unchanged. Pertinent negatives include no anorexia, change in bowel habit, myalgias or urinary symptoms. Associated symptoms comments: No rectal pain, no blood in stool, no constipation or diarrhea. Nothing aggravates the symptoms. He has tried nothing for the symptoms.    Outpatient Medications Prior to Visit  Medication Sig Dispense Refill  . abacavir-dolutegravir-lamiVUDine (TRIUMEQ) 600-50-300 MG tablet Take 1 tablet by mouth daily. 30 tablet 11  . ALPRAZolam (XANAX) 0.25 MG tablet Take 1 tablet (0.25 mg total) by mouth 2 (two) times daily as needed for anxiety. 180 tablet 1  . Pitavastatin Calcium 4 MG TABS Take 1 tablet (4 mg total) by mouth daily. 30 tablet 11  . sildenafil (VIAGRA) 100 MG tablet Take 0.5-1 tablets (50-100 mg total) by mouth daily as needed for erectile dysfunction. Needs office visit for further refills 10 tablet 1  . testosterone cypionate (DEPOTESTOTERONE CYPIONATE) 100 MG/ML injection 1.5 ml IM weekly 60 mL 5  . clotrimazole-betamethasone (LOTRISONE) cream Apply 1 application topically 2 (two) times daily. (Patient not taking: Reported on 03/21/2017) 45 g 0  . doxycycline (VIBRAMYCIN) 100 MG capsule Take 1 capsule (100 mg total) by mouth 2 (two) times daily. (Patient not taking: Reported on 02/13/2017) 20 capsule 0  . nystatin cream (MYCOSTATIN) Apply 1 application topically 2 (two) times daily. (Patient not taking: Reported on 02/13/2017) 30 g 0   No facility-administered medications prior to visit.     ROS See HPI  Objective:  BP 110/70   Pulse 65   Temp 98.3 F (36.8 C)   Ht 5\' 9"  (1.753 m)   Wt 170 lb (77.1 kg)   SpO2 98%   BMI  25.10 kg/m   BP Readings from Last 3 Encounters:  03/21/17 110/70  03/03/17 109/74  02/13/17 121/77    Wt Readings from Last 3 Encounters:  03/21/17 170 lb (77.1 kg)  03/03/17 170 lb 8 oz (77.3 kg)  02/13/17 170 lb (77.1 kg)    Physical Exam  Constitutional: He is oriented to person, place, and time. No distress.  Cardiovascular: Normal rate.   Pulmonary/Chest: Effort normal.  Genitourinary: Rectal exam shows mass. Rectal exam shows no external hemorrhoid and no tenderness.     Genitourinary Comments: Chaperone present  Neurological: He is alert and oriented to person, place, and time.  Skin: Rash noted. No erythema.  Psychiatric: He has a normal mood and affect. His behavior is normal.  Vitals reviewed.   Lab Results  Component Value Date   WBC 8.6 12/08/2016   HGB 16.4 12/08/2016   HCT 49.1 12/08/2016   PLT 189 12/08/2016   GLUCOSE 85 12/08/2016   CHOL 135 12/08/2016   TRIG 77 12/08/2016   HDL 35 (L) 12/08/2016   LDLCALC 85 12/08/2016   ALT 22 12/08/2016   AST 18 12/08/2016   NA 139 12/08/2016   K 4.6 12/08/2016   CL 108 12/08/2016   CREATININE 1.08 12/08/2016   BUN 17 12/08/2016   CO2 22 12/08/2016   TSH 1.750 10/28/2014   PSA 1.2 02/13/2017   INR 0.96 03/21/2011   MICROALBUR 1.1 02/11/2015    No results found.  Assessment & Plan:  Gerik was seen today for hemorrhoids.  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   I am having Mr. Heber start on imiquimod. I am also having him maintain his Pitavastatin Calcium, nystatin cream, clotrimazole-betamethasone, doxycycline, ALPRAZolam, sildenafil, abacavir-dolutegravir-lamiVUDine, and testosterone cypionate.  Meds ordered this encounter  Medications  . imiquimod (ALDARA) 5 % cream    Sig: Apply topically 3 (three) times a week.    Dispense:  12 each    Refill:  0    Order Specific Question:    Supervising Provider    Answer:   Binnie Rail [[id number]]    Follow-up: Return if symptoms worsen or fail to improve.  Wilfred Lacy, NP

## 2017-03-28 NOTE — Addendum Note (Signed)
Addended by: Lorne Skeens D on: 03/28/2017 04:51 PM   Modules accepted: Orders

## 2017-03-29 ENCOUNTER — Encounter: Payer: Self-pay | Admitting: Internal Medicine

## 2017-03-29 ENCOUNTER — Ambulatory Visit (INDEPENDENT_AMBULATORY_CARE_PROVIDER_SITE_OTHER): Payer: 59 | Admitting: Internal Medicine

## 2017-03-29 DIAGNOSIS — E291 Testicular hypofunction: Secondary | ICD-10-CM | POA: Diagnosis not present

## 2017-03-29 DIAGNOSIS — F419 Anxiety disorder, unspecified: Secondary | ICD-10-CM | POA: Diagnosis not present

## 2017-03-29 DIAGNOSIS — Z0000 Encounter for general adult medical examination without abnormal findings: Secondary | ICD-10-CM

## 2017-03-29 NOTE — Assessment & Plan Note (Signed)
Discussed shingrix and he denies having chicken pox. Tetanus, flu, pneumonia, hep b/a up to date. Colonoscopy up to date. Counseled about the need for exercise. Given screening recommendations.

## 2017-03-29 NOTE — Patient Instructions (Signed)
Think about getting back into the exercise.    Health Maintenance, Male A healthy lifestyle and preventive care is important for your health and wellness. Ask your health care provider about what schedule of regular examinations is right for you. What should I know about weight and diet? Eat a Healthy Diet  Eat plenty of vegetables, fruits, whole grains, low-fat dairy products, and lean protein.  Do not eat a lot of foods high in solid fats, added sugars, or salt.  Maintain a Healthy Weight Regular exercise can help you achieve or maintain a healthy weight. You should:  Do at least 150 minutes of exercise each week. The exercise should increase your heart rate and make you sweat (moderate-intensity exercise).  Do strength-training exercises at least twice a week.  Watch Your Levels of Cholesterol and Blood Lipids  Have your blood tested for lipids and cholesterol every 5 years starting at 50 years of age. If you are at high risk for heart disease, you should start having your blood tested when you are 50 years old. You may need to have your cholesterol levels checked more often if: ? Your lipid or cholesterol levels are high. ? You are older than 50 years of age. ? You are at high risk for heart disease.  What should I know about cancer screening? Many types of cancers can be detected early and may often be prevented. Lung Cancer  You should be screened every year for lung cancer if: ? You are a current smoker who has smoked for at least 30 years. ? You are a former smoker who has quit within the past 15 years.  Talk to your health care provider about your screening options, when you should start screening, and how often you should be screened.  Colorectal Cancer  Routine colorectal cancer screening usually begins at 50 years of age and should be repeated every 5-10 years until you are 50 years old. You may need to be screened more often if early forms of precancerous polyps or  small growths are found. Your health care provider may recommend screening at an earlier age if you have risk factors for colon cancer.  Your health care provider may recommend using home test kits to check for hidden blood in the stool.  A small camera at the end of a tube can be used to examine your colon (sigmoidoscopy or colonoscopy). This checks for the earliest forms of colorectal cancer.  Prostate and Testicular Cancer  Depending on your age and overall health, your health care provider may do certain tests to screen for prostate and testicular cancer.  Talk to your health care provider about any symptoms or concerns you have about testicular or prostate cancer.  Skin Cancer  Check your skin from head to toe regularly.  Tell your health care provider about any new moles or changes in moles, especially if: ? There is a change in a mole's size, shape, or color. ? You have a mole that is larger than a pencil eraser.  Always use sunscreen. Apply sunscreen liberally and repeat throughout the day.  Protect yourself by wearing long sleeves, pants, a wide-brimmed hat, and sunglasses when outside.  What should I know about heart disease, diabetes, and high blood pressure?  If you are 52-61 years of age, have your blood pressure checked every 3-5 years. If you are 72 years of age or older, have your blood pressure checked every year. You should have your blood pressure measured twice-once when  you are at a hospital or clinic, and once when you are not at a hospital or clinic. Record the average of the two measurements. To check your blood pressure when you are not at a hospital or clinic, you can use: ? An automated blood pressure machine at a pharmacy. ? A home blood pressure monitor.  Talk to your health care provider about your target blood pressure.  If you are between 66-16 years old, ask your health care provider if you should take aspirin to prevent heart disease.  Have regular  diabetes screenings by checking your fasting blood sugar level. ? If you are at a normal weight and have a low risk for diabetes, have this test once every three years after the age of 66. ? If you are overweight and have a high risk for diabetes, consider being tested at a younger age or more often.  A one-time screening for abdominal aortic aneurysm (AAA) by ultrasound is recommended for men aged 5-75 years who are current or former smokers. What should I know about preventing infection? Hepatitis B If you have a higher risk for hepatitis B, you should be screened for this virus. Talk with your health care provider to find out if you are at risk for hepatitis B infection. Hepatitis C Blood testing is recommended for:  Everyone born from [birth year] through 1964.  Anyone with known risk factors for hepatitis C.  Sexually Transmitted Diseases (STDs)  You should be screened each year for STDs including gonorrhea and chlamydia if: ? You are sexually active and are younger than 50 years of age. ? You are older than 50 years of age and your health care provider tells you that you are at risk for this type of infection. ? Your sexual activity has changed since you were last screened and you are at an increased risk for chlamydia or gonorrhea. Ask your health care provider if you are at risk.  Talk with your health care provider about whether you are at high risk of being infected with HIV. Your health care provider may recommend a prescription medicine to help prevent HIV infection.  What else can I do?  Schedule regular health, dental, and eye exams.  Stay current with your vaccines (immunizations).  Do not use any tobacco products, such as cigarettes, chewing tobacco, and e-cigarettes. If you need help quitting, ask your health care provider.  Limit alcohol intake to no more than 2 drinks per day. One drink equals 12 ounces of beer, 5 ounces of wine, or 1 ounces of hard liquor.  Do not use  street drugs.  Do not share needles.  Ask your health care provider for help if you need support or information about quitting drugs.  Tell your health care provider if you often feel depressed.  Tell your health care provider if you have ever been abused or do not feel safe at home. This information is not intended to replace advice given to you by your health care provider. Make sure you discuss any questions you have with your health care provider. Document Released: 11/19/2007 Document Revised: 01/20/2016 Document Reviewed: 02/24/2015 Elsevier Interactive Patient Education  [employer].

## 2017-03-29 NOTE — Progress Notes (Signed)
   Subjective:    Patient ID: Amogh Komatsu, male    DOB: 04/05/1966, 51 y.o.   MRN: [id number]  HPI The patient is a 51 YO man coming in for physical.   PMH, [facility], social history reviewed and updated.   Review of Systems  Constitutional: Positive for fatigue. Negative for activity change, appetite change, chills, fever and unexpected weight change.  HENT: Negative.   Eyes: Negative.   Respiratory: Negative for cough, chest tightness and shortness of breath.   Cardiovascular: Negative for chest pain, palpitations and leg swelling.  Gastrointestinal: Negative for abdominal distention, abdominal pain, constipation, diarrhea, nausea and vomiting.  Musculoskeletal: Negative.   Skin: Negative.   Neurological: Negative.   Psychiatric/Behavioral: Negative.       Objective:   Physical Exam  Constitutional: He is oriented to person, place, and time. He appears well-developed and well-nourished.  HENT:  Head: Normocephalic and atraumatic.  Eyes: EOM are normal.  Neck: Normal range of motion.  Cardiovascular: Normal rate and regular rhythm.   Pulmonary/Chest: Effort normal and breath sounds normal. No respiratory distress. He has no wheezes. He has no rales.  Abdominal: Soft. Bowel sounds are normal. He exhibits no distension. There is no tenderness. There is no rebound.  Musculoskeletal: He exhibits no edema.  Neurological: He is alert and oriented to person, place, and time. Coordination normal.  Skin: Skin is warm and dry.  Psychiatric: He has a normal mood and affect.   Vitals:   03/29/17 0757  BP: 110/78  Pulse: 78  Temp: 98.1 F (36.7 C)  TempSrc: Oral  SpO2: 99%  Weight: 171 lb (77.6 kg)  Height: 5\' 9"  (1.753 m)      Assessment & Plan:

## 2017-03-29 NOTE — Assessment & Plan Note (Signed)
Uses xanax rarely and can refill if needed.  

## 2017-03-29 NOTE — Assessment & Plan Note (Signed)
His ID doctor is prescribing testosterone therapy, will inform of the need to monitor Hg due to his levels already at top of normal off therapy for safety.

## 2017-04-24 ENCOUNTER — Other Ambulatory Visit: Payer: 59

## 2017-05-05 ENCOUNTER — Other Ambulatory Visit: Payer: Self-pay | Admitting: Internal Medicine

## 2017-05-09 ENCOUNTER — Other Ambulatory Visit: Payer: Self-pay

## 2017-05-09 MED ORDER — SILDENAFIL CITRATE 100 MG PO TABS: 50.0000 mg | ORAL_TABLET | Freq: Every day | ORAL | 1 refills | Status: DC | PRN

## 2017-06-23 ENCOUNTER — Encounter (INDEPENDENT_AMBULATORY_CARE_PROVIDER_SITE_OTHER): Payer: 59 | Admitting: *Deleted

## 2017-06-23 ENCOUNTER — Encounter: Payer: Self-pay | Admitting: Infectious Disease

## 2017-06-23 VITALS — BP 110/74 | HR 78 | Temp 98.3°F | Wt 169.5 lb

## 2017-06-23 DIAGNOSIS — Z006 Encounter for examination for normal comparison and control in clinical research program: Secondary | ICD-10-CM

## 2017-06-23 NOTE — Progress Notes (Signed)
[name] is here for his month 17 visit for Reprieve. His adherence is excellent and he denies any new problems or medications. He will schedule an appointment for labs and to see the MD soon. He is scheduled to come back for research in May.

## 2017-06-27 ENCOUNTER — Other Ambulatory Visit: Payer: Self-pay

## 2017-07-10 ENCOUNTER — Other Ambulatory Visit: Payer: Managed Care, Other (non HMO)

## 2017-07-10 ENCOUNTER — Other Ambulatory Visit (HOSPITAL_COMMUNITY)
Admission: RE | Admit: 2017-07-10 | Discharge: 2017-07-10 | Disposition: A | Discharge status: Home / Self Care | Payer: Managed Care, Other (non HMO) | Source: Ambulatory Visit | Attending: Infectious Disease | Admitting: Infectious Disease

## 2017-07-10 DIAGNOSIS — Z113 Encounter for screening for infections with a predominantly sexual mode of transmission: Secondary | ICD-10-CM

## 2017-07-10 DIAGNOSIS — E291 Testicular hypofunction: Secondary | ICD-10-CM

## 2017-07-10 DIAGNOSIS — B20 Human immunodeficiency virus [HIV] disease: Secondary | ICD-10-CM

## 2017-07-11 LAB — TESTOSTERONE TOTAL,FREE,BIO, MALES
Albumin: 4.5 g/dL (ref 3.6–5.1)
SEX HORMONE BINDING: 15 nmol/L (ref 10–50)
Testosterone: 209 ng/dL — ABNORMAL LOW (ref 250–827)

## 2017-07-11 LAB — COMPLETE METABOLIC PANEL WITH GFR
AG Ratio: 1.6 (calc) (ref 1.0–2.5)
ALBUMIN MSPROF: 4.5 g/dL (ref 3.6–5.1)
ALT: 30 U/L (ref 9–46)
AST: 21 U/L (ref 10–35)
Alkaline phosphatase (APISO): 72 U/L (ref 40–115)
BUN: 15 mg/dL (ref 7–25)
CALCIUM: 9.8 mg/dL (ref 8.6–10.3)
CO2: 26 mmol/L (ref 20–32)
CREATININE: 0.91 mg/dL (ref 0.70–1.33)
Chloride: 107 mmol/L (ref 98–110)
GFR, EST AFRICAN AMERICAN: 113 mL/min/{1.73_m2} (ref >=60)
GFR, Est Non African American: 98 mL/min/{1.73_m2} (ref >=60)
GLOBULIN: 2.8 g/dL (ref 1.9–3.7)
Glucose, Bld: 89 mg/dL (ref 65–99)
Potassium: 4.4 mmol/L (ref 3.5–5.3)
SODIUM: 144 mmol/L (ref 135–146)
TOTAL PROTEIN: 7.3 g/dL (ref 6.1–8.1)
Total Bilirubin: 0.5 mg/dL (ref 0.2–1.2)

## 2017-07-11 LAB — CBC WITH DIFFERENTIAL/PLATELET
Basophils Absolute: 50 cells/uL (ref 0–200)
Basophils Relative: 0.4 %
EOS ABS: 74 {cells}/uL (ref 15–500)
Eosinophils Relative: 0.6 %
HEMATOCRIT: 44.1 % (ref 38.5–50.0)
Hemoglobin: 15.6 g/dL (ref 13.2–17.1)
LYMPHS ABS: 2244 {cells}/uL (ref 850–3900)
MCH: 35 pg — ABNORMAL HIGH (ref 27.0–33.0)
MCHC: 35.4 g/dL (ref 32.0–36.0)
MCV: 98.9 fL (ref 80.0–100.0)
MPV: 10.8 fL (ref 7.5–12.5)
Monocytes Relative: 4.6 %
NEUTROS PCT: 76.3 %
Neutro Abs: 9461 cells/uL — ABNORMAL HIGH (ref 1500–7800)
PLATELETS: 208 10*3/uL (ref 140–400)
RBC: 4.46 10*6/uL (ref 4.20–5.80)
RDW: 12.8 % (ref 11.0–15.0)
TOTAL LYMPHOCYTE: 18.1 %
WBC: 12.4 10*3/uL — ABNORMAL HIGH (ref 3.8–10.8)
WBCMIX: 570 {cells}/uL (ref 200–950)

## 2017-07-11 LAB — URINE CYTOLOGY ANCILLARY ONLY
CHLAMYDIA, DNA PROBE: NEGATIVE
NEISSERIA GONORRHEA: NEGATIVE

## 2017-07-11 LAB — TEST AUTHORIZATION

## 2017-07-11 LAB — RPR: RPR Ser Ql: NONREACTIVE

## 2017-07-11 LAB — T-HELPER CELL (CD4) - (RCID CLINIC ONLY)
CD4 T CELL HELPER: 21 % — AB (ref 33–55)
CD4 T Cell Abs: 490 /uL (ref 400–2700)

## 2017-07-12 LAB — HIV-1 RNA QUANT-NO REFLEX-BLD
HIV 1 RNA QUANT: DETECTED {copies}/mL — AB
HIV-1 RNA Quant, Log: <1.3 Log copies/mL — AB

## 2017-07-26 ENCOUNTER — Other Ambulatory Visit (HOSPITAL_COMMUNITY)
Admission: RE | Admit: 2017-07-26 | Discharge: 2017-07-26 | Disposition: A | Discharge status: Home / Self Care | Payer: Managed Care, Other (non HMO) | Source: Ambulatory Visit | Attending: Infectious Disease | Admitting: Infectious Disease

## 2017-07-26 ENCOUNTER — Ambulatory Visit: Payer: Managed Care, Other (non HMO) | Admitting: Infectious Disease

## 2017-07-26 ENCOUNTER — Encounter: Payer: Self-pay | Admitting: Infectious Disease

## 2017-07-26 VITALS — BP 106/72 | HR 77 | Temp 98.6°F | Ht 69.0 in | Wt 172.0 lb

## 2017-07-26 DIAGNOSIS — Z113 Encounter for screening for infections with a predominantly sexual mode of transmission: Secondary | ICD-10-CM | POA: Diagnosis not present

## 2017-07-26 DIAGNOSIS — B20 Human immunodeficiency virus [HIV] disease: Secondary | ICD-10-CM | POA: Diagnosis not present

## 2017-07-26 DIAGNOSIS — E291 Testicular hypofunction: Secondary | ICD-10-CM

## 2017-07-26 DIAGNOSIS — Z79899 Other long term (current) drug therapy: Secondary | ICD-10-CM | POA: Diagnosis not present

## 2017-07-26 MED ORDER — TESTOSTERONE CYPIONATE 100 MG/ML IM SOLN: INTRAMUSCULAR | 5 refills | Status: DC

## 2017-07-26 NOTE — Addendum Note (Signed)
Addended by: Dolan Amen D on: 07/26/2017 12:28 PM   Modules accepted: Orders

## 2017-07-26 NOTE — Progress Notes (Signed)
Subjective:   Chief complaint:Her for follow-up of his HIV  Patient ID: [name], male    DOB: 1966/01/25, 52 y.o.   MRN: [id number]  HPI  [name] is a 52 y.o. male with HIV/AIDS who is doing well on his current ARV of [place] with controlled virus and healthy CD4.     Lab Results  Component Value Date   HIV1RNAQUANT <20 DETECTED (A) 07/10/2017   HIV1RNAQUANT <20 NOT DETECTED 12/08/2016   HIV1RNAQUANT <20 DETECTED (A) 07/08/2016     Lab Results  Component Value Date   CD4TABS 490 07/10/2017   CD4TABS 390 (L) 12/08/2016   CD4TABS 380 (L) 05/18/2016      Past Medical History:  Diagnosis Date  . ALLERGIC RHINITIS   . Anal fissure    Chronic, recurrent anal bleeding with pain  . Depression   . Grieving 12/09/2015  . HIV INFECTION 07/05/2010 dx  . 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 NEUTROPENIA, DRUG-INDUCED    on chemo early 2012  . 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 Problems in relationship with spouse or partner 12/09/2015  . 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 Thrombocytopenia ([place])    resolved  . Toe fracture, right 10/04/2012   great toe, nonsurgical mgmt Sharol Given)    Past Surgical History:  Procedure Laterality Date  . ANAL INTRAEPITHELIAL NEOPLASIA EXCISION  06/2011   wide exiscion of Sq cell ca  . LYMPH NODE BIOPSY  1975   L ingunial - nondx  . [place] fracture surgery  2010   L elbow  . NASAL SEPTUM SURGERY  2006  . Precancer lesion removed from anus  2012    Family History  Problem Relation Age of Onset  . Cancer Father 38       unknown primary  . Alcohol abuse Father   . Cirrhosis Father        alcohol  . Alcohol abuse Mother   . Hypertension Mother   . Hyperlipidemia Brother   . Macular degeneration  Brother        histoplasm  . Hypertension Sister 29       carotid blockage      Social History   Socioeconomic History  . Marital status: Married    Spouse name: None  . Number of children: None  . Years of education: None  . Highest education level: None  Social Needs  . Financial resource strain: None  . Food insecurity - worry: None  . Food insecurity - inability: None  . Transportation needs - medical: None  . Transportation needs - non-medical: None  Occupational History  . None  Tobacco Use  . Smoking status: Current Some Day Smoker    Packs/day: 1.00    Types: Cigarettes    Last attempt to quit: 07/22/2015    Years since quitting: 2.0  . Smokeless tobacco: Never Used  Substance and Sexual Activity  . Alcohol use: Yes    Alcohol/week: 0.0 oz    Comment: occasional, socially  . Drug use: No  . Sexual activity: Yes    Comment: delined condoms  Other Topics Concern  . None  Social History Narrative   Married - Naval architect   employed at [employer]    Allergies  Allergen Reactions  . Sulfonamide Derivatives     REACTION: rash, swelling, trouble breathing     Current Outpatient Medications:  .  abacavir-dolutegravir-lamiVUDine (TRIUMEQ) 600-50-300 MG tablet, Take 1 tablet by mouth daily., Disp: 30 tablet, Rfl: 11 .  ALPRAZolam (XANAX) 0.25 MG tablet, TAKE 1 TABLET BY MOUTH TWO  TIMES DAILY AS NEEDED FOR  ANXIETY, Disp: 180 tablet, Rfl: 1 .  Pitavastatin Calcium 4 MG TABS, Take 1 tablet (4 mg total) by mouth daily., Disp: 30 tablet, Rfl: 11 .  testosterone cypionate (DEPOTESTOTERONE CYPIONATE) 100 MG/ML injection, 2.0 ml IM weekly, Disp: 60 mL, Rfl: 5 .  clotrimazole-betamethasone (LOTRISONE) cream, Apply 1 application topically 2 (two) times daily. (Patient not taking: Reported on 03/29/2017), Disp: 45 g, Rfl: 0 .  imiquimod (ALDARA) 5 % cream, Apply topically 3 (three) times a week., Disp: 12 each, Rfl: 0 .  nystatin cream (MYCOSTATIN),  Apply 1 application topically 2 (two) times daily. (Patient not taking: Reported on 02/13/2017), Disp: 30 g, Rfl: 0 .  sildenafil (VIAGRA) 100 MG tablet, Take 0.5-1 tablets (50-100 mg total) by mouth daily as needed for erectile dysfunction. Needs office visit for further refills, Disp: 10 tablet, Rfl: 1    Review of Systems  Constitutional: Negative for activity change, appetite change, chills, diaphoresis, fatigue, fever and unexpected weight change.  HENT: Negative for rhinorrhea, sinus pressure, sneezing and sore throat.   Eyes: Negative for visual disturbance.  Respiratory: Negative for chest tightness and wheezing.   Cardiovascular: Negative for chest pain, palpitations and leg swelling.  Gastrointestinal: Negative for abdominal distention, anal bleeding, blood in stool, constipation and nausea.  Genitourinary: Negative for difficulty urinating, dysuria, flank pain and hematuria.  Musculoskeletal: Negative for arthralgias, back pain, gait problem, joint swelling and myalgias.  Skin: Negative for color change, pallor, rash and wound.  Neurological: Negative for dizziness, tremors, weakness and light-headedness.  Hematological: Negative for adenopathy. Does not bruise/bleed easily.  Psychiatric/Behavioral: Negative for agitation, behavioral problems, confusion, decreased concentration and sleep disturbance.       Objective:   Physical Exam  Constitutional: He is oriented to person, place, and time. He appears well-developed and well-nourished.  HENT:  Head: Normocephalic and atraumatic.  Eyes: Conjunctivae and EOM are normal.  Neck: Normal range of motion. Neck supple.  Cardiovascular: Normal rate and regular rhythm.  Pulmonary/Chest: Effort normal. No respiratory distress. He has no wheezes.  Abdominal: Soft. He exhibits no distension.  Musculoskeletal: Normal range of motion. He exhibits no edema or tenderness.  Neurological: He is alert and oriented to person, place, and time.   Skin: Skin is warm and dry. No rash noted. No erythema. No pallor.  Psychiatric: He has a normal mood and affect. His behavior is normal. Judgment and thought content normal.          Assessment & Plan:     HIV: continue TRIUMEQ  Recheck labs in 6 months time with visit. 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 Surgery   Low testosterone: most recent level was 200. Will  Increase to 200mg  weekly and recheck level in 2-3 months   STI screening:  rescreen from urine and extragenital sites when testing

## 2017-07-27 LAB — CYTOLOGY, (ORAL, ANAL, URETHRAL) ANCILLARY ONLY
Chlamydia: NEGATIVE
Chlamydia: NEGATIVE
NEISSERIA GONORRHEA: NEGATIVE
Neisseria Gonorrhea: NEGATIVE

## 2017-09-13 ENCOUNTER — Encounter: Payer: Self-pay | Admitting: Internal Medicine

## 2017-09-13 ENCOUNTER — Other Ambulatory Visit: Payer: Self-pay

## 2017-09-13 MED ORDER — SILDENAFIL CITRATE 100 MG PO TABS: 50.0000 mg | ORAL_TABLET | Freq: Every day | ORAL | 1 refills | Status: DC | PRN

## 2017-10-27 ENCOUNTER — Encounter (INDEPENDENT_AMBULATORY_CARE_PROVIDER_SITE_OTHER): Payer: Self-pay | Admitting: *Deleted

## 2017-10-27 VITALS — BP 118/84 | HR 66 | Temp 98.3°F | Wt 174.8 lb

## 2017-10-27 DIAGNOSIS — Z006 Encounter for examination for normal comparison and control in clinical research program: Secondary | ICD-10-CM

## 2017-10-27 NOTE — Progress Notes (Signed)
[name] is here for his month 56 visit for Reprieve. He denies any new problems or concerns. He says his adherence is excellent and denies any muscle aches or weakness. He is due to return in September.

## 2017-11-20 ENCOUNTER — Telehealth: Payer: Self-pay

## 2017-11-20 ENCOUNTER — Other Ambulatory Visit: Payer: Self-pay | Admitting: Infectious Disease

## 2017-11-20 DIAGNOSIS — E291 Testicular hypofunction: Secondary | ICD-10-CM

## 2017-11-20 NOTE — Telephone Encounter (Signed)
Pa approved spoke with pharmacist to notify them of pts PA approval. Aundria Rud, [place]

## 2017-11-20 NOTE — Telephone Encounter (Signed)
Received pa for pt's testosterone CYP 100mg /ml inj, 10 ml. Initiated PA today 11/20/17 pa is under clinical review will be notified via fax is pa has been approved or denied within 24/72 hrs.  [place]

## 2017-11-23 ENCOUNTER — Other Ambulatory Visit: Payer: Self-pay

## 2017-11-23 ENCOUNTER — Telehealth: Payer: Self-pay

## 2017-11-23 DIAGNOSIS — E291 Testicular hypofunction: Secondary | ICD-10-CM

## 2017-11-23 MED ORDER — TESTOSTERONE CYPIONATE 100 MG/ML IM SOLN: INTRAMUSCULAR | 5 refills | Status: DC

## 2017-11-23 NOTE — Telephone Encounter (Signed)
Pharmacy unable to to take verbal for pt's medication. Pharmacist stated dose was incorrect would like for MD to clarify before dispensing.   [place]

## 2017-11-28 NOTE — Telephone Encounter (Signed)
Left a vm for pt to call back to verify medication will refill once pt confirms dosage and how often medication is taken. [name]

## 2017-11-28 NOTE — Telephone Encounter (Signed)
Can we check with [place] with regards to how he is been taking the medications and for how long?

## 2018-01-09 ENCOUNTER — Encounter: Payer: Self-pay | Admitting: Internal Medicine

## 2018-01-10 NOTE — Addendum Note (Signed)
Addended by: Raford Pitcher R on: 01/10/2018 01:17 PM   Modules accepted: Orders

## 2018-01-11 MED ORDER — ALPRAZOLAM 0.25 MG PO TABS: 0.2500 mg | ORAL_TABLET | Freq: Two times a day (BID) | ORAL | 1 refills | Status: DC | PRN

## 2018-01-11 MED ORDER — SILDENAFIL CITRATE 100 MG PO TABS: 50.0000 mg | ORAL_TABLET | Freq: Every day | ORAL | 6 refills | Status: DC | PRN

## 2018-02-08 ENCOUNTER — Ambulatory Visit (HOSPITAL_COMMUNITY)
Admission: EM | Admit: 2018-02-08 | Discharge: 2018-02-08 | Disposition: A | Discharge status: Home / Self Care | Payer: Managed Care, Other (non HMO) | Attending: Family Medicine | Admitting: Family Medicine

## 2018-02-08 ENCOUNTER — Encounter (HOSPITAL_COMMUNITY): Payer: Self-pay | Admitting: *Deleted

## 2018-02-08 ENCOUNTER — Other Ambulatory Visit: Payer: Self-pay

## 2018-02-08 ENCOUNTER — Ambulatory Visit (INDEPENDENT_AMBULATORY_CARE_PROVIDER_SITE_OTHER): Payer: Managed Care, Other (non HMO)

## 2018-02-08 DIAGNOSIS — J0100 Acute maxillary sinusitis, unspecified: Secondary | ICD-10-CM | POA: Diagnosis not present

## 2018-02-08 DIAGNOSIS — R0981 Nasal congestion: Secondary | ICD-10-CM

## 2018-02-08 MED ORDER — AMOXICILLIN-POT CLAVULANATE 875-125 MG PO TABS: 1.0000 | ORAL_TABLET | Freq: Two times a day (BID) | ORAL | 0 refills | Status: DC

## 2018-02-08 NOTE — ED Triage Notes (Signed)
C/o nasal congestion , sore throat and blisters in his mouth

## 2018-02-14 ENCOUNTER — Other Ambulatory Visit: Payer: Self-pay | Admitting: *Deleted

## 2018-02-14 DIAGNOSIS — B20 Human immunodeficiency virus [HIV] disease: Secondary | ICD-10-CM

## 2018-02-14 MED ORDER — ABACAVIR-DOLUTEGRAVIR-LAMIVUD 600-50-300 MG PO TABS: 1.0000 | ORAL_TABLET | Freq: Every day | ORAL | 5 refills | Status: DC

## 2018-02-21 NOTE — ED Provider Notes (Signed)
[name]   [id number] 02/08/18 Arrival Time: 6073  ASSESSMENT & PLAN:  1. Nasal congestion   2. Acute non-recurrent maxillary sinusitis     Meds ordered this encounter  Medications  . amoxicillin-clavulanate (AUGMENTIN) 875-125 MG tablet    Sig: Take 1 tablet by mouth every 12 (twelve) hours.    Dispense:  20 tablet    Refill:  0   OTC symptom care as needed. May f/u with PCP or here as needed.  Reviewed expectations re: course of current medical issues. Questions answered. Outlined signs and symptoms indicating need for more acute intervention. Patient verbalized understanding. After Visit Summary given.   SUBJECTIVE: History from: patient.  [name] is a 52 y.o. male who presents with complaint of nasal congestion, post-nasal drainage, and sinus pain. Onset gradual, greater than 1 week ago. Respiratory symptoms: none Fever: no. Overall normal PO intake without n/v. OTC treatment: none reported. Mild associated ST. History of frequent sinus infections: no. Social History   Tobacco Use  Smoking Status Current Some Day Smoker  . Packs/day: 1.00  . Types: Cigarettes  . Last attempt to quit: 07/22/2015  . Years since quitting: 2.5  Smokeless Tobacco Never Used    ROS: As per HPI.   OBJECTIVE:  Vitals:   02/08/18 1555  BP: 130/84  Resp: 20  Temp: 98.1 F (36.7 C)  TempSrc: Oral  SpO2: 98%     General appearance: alert; appears fatigued HEENT: nasal congestion; clear runny nose; throat irritation secondary to post-nasal drainage; bilateral maxillary tenderness to palpation; turbinates boggy Neck: supple without LAD Lungs: unlabored respirations, symmetrical air entry; cough: mild; no respiratory distress Skin: warm and dry Psychological: alert and cooperative; normal mood and affect  Allergies  Allergen Reactions  . Sulfonamide Derivatives     REACTION: rash, swelling, trouble breathing    Past Medical History:  Diagnosis Date  .  ALLERGIC RHINITIS   . Anal fissure    Chronic, recurrent anal bleeding with pain  . Depression   . Grieving 12/09/2015  . HIV INFECTION 07/05/2010 dx  . 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 NEUTROPENIA, DRUG-INDUCED    on chemo early 2012  . 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 Problems in relationship with spouse or partner 12/09/2015  . 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 Thrombocytopenia ([place])    resolved  . Toe fracture, right 10/04/2012   great toe, nonsurgical mgmt Sharol Given)   Family History  Problem Relation Age of Onset  . Cancer Father 43       unknown primary  . Alcohol abuse Father   . Cirrhosis Father        alcohol  . Alcohol abuse Mother   . Hypertension Mother   . Hyperlipidemia Brother   . Macular degeneration Brother        histoplasm  . Hypertension Sister 60       carotid blockage   Social History   Socioeconomic History  . Marital status: Married    Spouse name: Not on file  . Number of children: Not on file  . Years of education: Not on file  . Highest education level: Not on file  Occupational History  . Not on file  Social Needs  . Financial  resource strain: Not on file  . Food insecurity:    Worry: Not on file    Inability: Not on file  . Transportation needs:    Medical: Not on file    Non-medical: Not on file  Tobacco Use  . Smoking status: Current Some Day Smoker    Packs/day: 1.00    Types: Cigarettes    Last attempt to quit: 07/22/2015    Years since quitting: 2.5  . Smokeless tobacco: Never Used  Substance and Sexual Activity  . Alcohol use: Yes    Alcohol/week: 0.0 standard drinks    Comment: occasional, socially  . Drug use: No  . Sexual activity: Yes    Comment: delined condoms  Lifestyle  . Physical  activity:    Days per week: Not on file    Minutes per session: Not on file  . Stress: Not on file  Relationships  . Social connections:    Talks on phone: Not on file    Gets together: Not on file    Attends religious service: Not on file    Active member of club or organization: Not on file    Attends meetings of clubs or organizations: Not on file    Relationship status: Not on file  . Intimate partner violence:    Fear of current or ex partner: Not on file    Emotionally abused: Not on file    Physically abused: Not on file    Forced sexual activity: Not on file  Other Topics Concern  . 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

## 2018-02-23 ENCOUNTER — Encounter (INDEPENDENT_AMBULATORY_CARE_PROVIDER_SITE_OTHER): Payer: Managed Care, Other (non HMO) | Admitting: *Deleted

## 2018-02-23 VITALS — BP 120/79 | HR 74 | Temp 97.7°F | Wt 173.5 lb

## 2018-02-23 DIAGNOSIS — Z006 Encounter for examination for normal comparison and control in clinical research program: Secondary | ICD-10-CM

## 2018-02-23 NOTE — Research (Signed)
[name] was here for his month 66 visit for Reprieve. He denies any problems currently and says his adherence to his meds is 100%. He will be returning next month for labs and to see Dr. Tommy Medal. And then in January for research.

## 2018-03-15 ENCOUNTER — Other Ambulatory Visit: Payer: Self-pay | Admitting: Infectious Disease

## 2018-03-16 ENCOUNTER — Telehealth: Payer: Self-pay | Admitting: Behavioral Health

## 2018-03-16 NOTE — Telephone Encounter (Signed)
Patient is requesting a refill on Testosterone cypionate inj.  Patient was supposed to have testosterone levels checked in May 2019 do not see record of this.  Is this medication appropriate to refill.  Patient has upcoming lab visit for HIV labs 03/28/2018. Pricilla Riffle RN

## 2018-03-18 NOTE — Telephone Encounter (Signed)
And I know they mentioned that we had increased the dose and will be rechecking it in the near future can you come in for labs?

## 2018-03-19 NOTE — Telephone Encounter (Signed)
Morning testosterone and then he should have a CBC w differential (but that would be part of his normal labs). I also think getting PSA is a good idea

## 2018-03-19 NOTE — Telephone Encounter (Signed)
Mr. Dirr comes in 03/28/2018 for lab work. Pricilla Riffle RN

## 2018-03-20 ENCOUNTER — Other Ambulatory Visit: Payer: Self-pay | Admitting: Behavioral Health

## 2018-03-20 DIAGNOSIS — R7989 Other specified abnormal findings of blood chemistry: Secondary | ICD-10-CM

## 2018-03-20 DIAGNOSIS — B20 Human immunodeficiency virus [HIV] disease: Secondary | ICD-10-CM

## 2018-03-20 DIAGNOSIS — E291 Testicular hypofunction: Secondary | ICD-10-CM

## 2018-03-20 DIAGNOSIS — Z0000 Encounter for general adult medical examination without abnormal findings: Secondary | ICD-10-CM

## 2018-03-20 NOTE — Telephone Encounter (Signed)
PSA, CBC with diff, and morning testosterone ordered.  Patient to come in on Wednesday 03/28/2018 for lab work.   Pricilla Riffle RN

## 2018-03-20 NOTE — Telephone Encounter (Signed)
We can refill it if he is on it already

## 2018-03-28 ENCOUNTER — Other Ambulatory Visit: Payer: Managed Care, Other (non HMO)

## 2018-03-28 ENCOUNTER — Other Ambulatory Visit (HOSPITAL_COMMUNITY)
Admission: RE | Admit: 2018-03-28 | Discharge: 2018-03-28 | Disposition: A | Discharge status: Home / Self Care | Payer: Managed Care, Other (non HMO) | Source: Ambulatory Visit | Attending: Infectious Disease | Admitting: Infectious Disease

## 2018-03-28 DIAGNOSIS — Z79899 Other long term (current) drug therapy: Secondary | ICD-10-CM | POA: Insufficient documentation

## 2018-03-28 DIAGNOSIS — B20 Human immunodeficiency virus [HIV] disease: Secondary | ICD-10-CM | POA: Diagnosis not present

## 2018-03-28 DIAGNOSIS — Z113 Encounter for screening for infections with a predominantly sexual mode of transmission: Secondary | ICD-10-CM | POA: Diagnosis not present

## 2018-03-28 DIAGNOSIS — R7989 Other specified abnormal findings of blood chemistry: Secondary | ICD-10-CM

## 2018-03-28 DIAGNOSIS — E291 Testicular hypofunction: Secondary | ICD-10-CM

## 2018-03-28 DIAGNOSIS — Z0000 Encounter for general adult medical examination without abnormal findings: Secondary | ICD-10-CM

## 2018-03-29 LAB — URINE CYTOLOGY ANCILLARY ONLY
Chlamydia: NEGATIVE
Neisseria Gonorrhea: NEGATIVE

## 2018-03-29 LAB — T-HELPER CELL (CD4) - (RCID CLINIC ONLY)
CD4 T CELL ABS: 430 /uL (ref 400–2700)
CD4 T CELL HELPER: 22 % — AB (ref 33–55)

## 2018-03-30 ENCOUNTER — Encounter: Payer: Self-pay | Admitting: *Deleted

## 2018-03-30 LAB — CBC WITH DIFFERENTIAL/PLATELET
BASOS ABS: 65 {cells}/uL (ref 0–200)
BASOS PCT: 0.6 %
EOS ABS: 108 {cells}/uL (ref 15–500)
Eosinophils Relative: 1 %
HCT: 53.1 % — ABNORMAL HIGH (ref 38.5–50.0)
HEMOGLOBIN: 18.7 g/dL — AB (ref 13.2–17.1)
Lymphs Abs: 2020 cells/uL (ref 850–3900)
MCH: 35.2 pg — ABNORMAL HIGH (ref 27.0–33.0)
MCHC: 35.2 g/dL (ref 32.0–36.0)
MCV: 100 fL (ref 80.0–100.0)
MPV: 11.2 fL (ref 7.5–12.5)
Monocytes Relative: 7.3 %
Neutro Abs: 7819 cells/uL — ABNORMAL HIGH (ref 1500–7800)
Neutrophils Relative %: 72.4 %
Platelets: 198 10*3/uL (ref 140–400)
RBC: 5.31 10*6/uL (ref 4.20–5.80)
RDW: 13.5 % (ref 11.0–15.0)
TOTAL LYMPHOCYTE: 18.7 %
WBC mixed population: 788 cells/uL (ref 200–950)
WBC: 10.8 10*3/uL (ref 3.8–10.8)

## 2018-03-30 LAB — COMPLETE METABOLIC PANEL WITH GFR
AG RATIO: 1.6 (calc) (ref 1.0–2.5)
ALBUMIN MSPROF: 4.4 g/dL (ref 3.6–5.1)
ALKALINE PHOSPHATASE (APISO): 71 U/L (ref 40–115)
ALT: 38 U/L (ref 9–46)
AST: 42 U/L — AB (ref 10–35)
BILIRUBIN TOTAL: 0.6 mg/dL (ref 0.2–1.2)
BUN: 13 mg/dL (ref 7–25)
CHLORIDE: 104 mmol/L (ref 98–110)
CO2: 26 mmol/L (ref 20–32)
CREATININE: 1.09 mg/dL (ref 0.70–1.33)
Calcium: 9.5 mg/dL (ref 8.6–10.3)
GFR, Est African American: 91 mL/min/{1.73_m2} (ref >=60)
GFR, Est Non African American: 78 mL/min/{1.73_m2} (ref >=60)
GLOBULIN: 2.7 g/dL (ref 1.9–3.7)
Glucose, Bld: 88 mg/dL (ref 65–99)
POTASSIUM: 4.2 mmol/L (ref 3.5–5.3)
SODIUM: 139 mmol/L (ref 135–146)
Total Protein: 7.1 g/dL (ref 6.1–8.1)

## 2018-03-30 LAB — LIPID PANEL
CHOL/HDL RATIO: 4.3 (calc) (ref <5.0)
CHOLESTEROL: 149 mg/dL (ref <200)
HDL: 35 mg/dL — AB (ref >40)
LDL CHOLESTEROL (CALC): 93 mg/dL
NON-HDL CHOLESTEROL (CALC): 114 mg/dL (ref <130)
Triglycerides: 117 mg/dL (ref <150)

## 2018-03-30 LAB — TESTOSTERONE TOTAL,FREE,BIO, MALES
Albumin: 4.4 g/dL (ref 3.6–5.1)
Sex Hormone Binding: 17 nmol/L (ref 10–50)
TESTOSTERONE: 393 ng/dL (ref 250–827)
Testosterone, Bioavailable: 170.5 ng/dL (ref >=110.0)
Testosterone, Free: 84.7 pg/mL (ref 46.0–224.0)

## 2018-03-30 LAB — HIV-1 RNA QUANT-NO REFLEX-BLD
HIV 1 RNA Quant: <20 copies/mL
HIV-1 RNA Quant, Log: <1.3 Log copies/mL

## 2018-03-30 LAB — PSA: PSA: 2.4 ng/mL (ref <=4.0)

## 2018-03-30 LAB — RPR: RPR Ser Ql: NONREACTIVE

## 2018-03-30 NOTE — Progress Notes (Signed)
It's hard to tell what he is getting right now. If he is on 200 mg weekly, then I would go down to 150 mg weekly and recheck in about 6 weeks.

## 2018-04-11 ENCOUNTER — Ambulatory Visit: Payer: Managed Care, Other (non HMO) | Admitting: Infectious Disease

## 2018-04-17 ENCOUNTER — Encounter (HOSPITAL_COMMUNITY): Payer: Self-pay | Admitting: Emergency Medicine

## 2018-04-17 ENCOUNTER — Ambulatory Visit (HOSPITAL_COMMUNITY)
Admission: EM | Admit: 2018-04-17 | Discharge: 2018-04-17 | Disposition: A | Discharge status: Home / Self Care | Payer: Managed Care, Other (non HMO) | Attending: Family Medicine | Admitting: Family Medicine

## 2018-04-17 DIAGNOSIS — B9789 Other viral agents as the cause of diseases classified elsewhere: Secondary | ICD-10-CM | POA: Diagnosis not present

## 2018-04-17 DIAGNOSIS — Z79899 Other long term (current) drug therapy: Secondary | ICD-10-CM | POA: Insufficient documentation

## 2018-04-17 DIAGNOSIS — F329 Major depressive disorder, single episode, unspecified: Secondary | ICD-10-CM | POA: Insufficient documentation

## 2018-04-17 DIAGNOSIS — J029 Acute pharyngitis, unspecified: Secondary | ICD-10-CM | POA: Insufficient documentation

## 2018-04-17 DIAGNOSIS — Z882 Allergy status to sulfonamides status: Secondary | ICD-10-CM | POA: Diagnosis not present

## 2018-04-17 DIAGNOSIS — B20 Human immunodeficiency virus [HIV] disease: Secondary | ICD-10-CM | POA: Insufficient documentation

## 2018-04-17 DIAGNOSIS — F17210 Nicotine dependence, cigarettes, uncomplicated: Secondary | ICD-10-CM | POA: Insufficient documentation

## 2018-04-17 DIAGNOSIS — J069 Acute upper respiratory infection, unspecified: Secondary | ICD-10-CM | POA: Diagnosis not present

## 2018-04-17 DIAGNOSIS — F419 Anxiety disorder, unspecified: Secondary | ICD-10-CM | POA: Insufficient documentation

## 2018-04-17 DIAGNOSIS — Z9889 Other specified postprocedural states: Secondary | ICD-10-CM | POA: Insufficient documentation

## 2018-04-17 LAB — POCT RAPID STREP A: Streptococcus, Group A Screen (Direct): NEGATIVE

## 2018-04-17 MED ORDER — FLUTICASONE PROPIONATE 50 MCG/ACT NA SUSP: 1.0000 | Freq: Every day | NASAL | 0 refills | Status: DC

## 2018-04-17 MED ORDER — PSEUDOEPH-BROMPHEN-DM 30-2-10 MG/5ML PO SYRP: 5.0000 mL | ORAL_SOLUTION | Freq: Four times a day (QID) | ORAL | 0 refills | Status: DC | PRN

## 2018-04-17 NOTE — ED Triage Notes (Signed)
Pt sts sore throat x 2 days

## 2018-04-17 NOTE — Discharge Instructions (Signed)
You likely having a viral upper respiratory infection. We recommended symptom control. I expect your symptoms to start improving in the next 1-2 weeks.   1. Take a daily allergy pill/anti-histamine like Zyrtec, Claritin, or Store brand consistently for 2 weeks  2. For congestion you may try an oral decongestant like Mucinex or sudafed. You may also try intranasal flonase nasal spray or saline irrigations (neti pot, sinus cleanse)  3. For your sore throat you may try tylneol, ibuprofen. cepacol lozenges, salt water gargles, throat spray. Treatment of congestion/drainage may also help your sore throat.  4. For cough you may try Robitussen, Mucinex DM or cough syrup provided  5. Take Tylenol or Ibuprofen to help with pain/inflammation  6. Stay hydrated, drink plenty of fluids to keep throat coated and less irritated  Honey Tea For cough/sore throat try using a honey-based tea. Use 3 teaspoons of honey with juice squeezed from half lemon. Place shaved pieces of ginger into 1/2-1 cup of water and warm over stove top. Then mix the ingredients and repeat every 4 hours as needed.

## 2018-04-17 NOTE — ED Provider Notes (Signed)
[place]    CSN: [id number] Arrival date & time: 04/17/18  1049     History   Chief Complaint Chief Complaint  Patient presents with  . Sore Throat    HPI [name] is a 53 y.o. male history of non-Hodgkin's lymphoma, HIV presenting today for evaluation of sore throat.  Patient states that his symptoms began yesterday, he has had nasal congestion, sore throat and cough.  Cough is been productive.  He is concerned about strep as he has had to close contacts test positive.  He denies any fevers.  Had diarrhea yesterday, but bowel movements have been normal today.  He has tried Alka-Seltzer cold and flu.  Denies chest pain shortness of breath. HPI  Past Medical History:  Diagnosis Date  . ALLERGIC RHINITIS   . Anal fissure    Chronic, recurrent anal bleeding with pain  . Depression   . Grieving 12/09/2015  . HIV INFECTION 07/05/2010 dx  . 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 NEUTROPENIA, DRUG-INDUCED    on chemo early 2012  . 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 Problems in relationship with spouse or partner 12/09/2015  . 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 Thrombocytopenia ([place])    resolved  . Toe fracture, right 10/04/2012   great toe, nonsurgical mgmt Sharol Given)    Patient Active Problem List   Diagnosis Date Noted  . Routine general medical examination at a health care facility 12/24/2015  . 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 Problems in relationship with spouse or partner 12/09/2015  . Hypogonadism in male 08/27/2014  . NHL (non-Hodgkin's lymphoma) ([place]) 05/26/2014  . Anxiety 06/08/2012  . Molluscum contagiosum 02/02/2011  . Human immunodeficiency virus (HIV) disease ([place]) 07/05/2010    Past Surgical History:  Procedure  Laterality Date  . ANAL INTRAEPITHELIAL NEOPLASIA EXCISION  06/2011   wide exiscion of Sq cell ca  . LYMPH NODE BIOPSY  1975   L ingunial - nondx  . Monteggia fracture surgery  2010   L elbow  . NASAL SEPTUM SURGERY  2006  . Precancer lesion removed from anus  2012       Home Medications    Prior to Admission medications   Medication Sig Start Date End Date Taking? Authorizing Provider  abacavir-dolutegravir-lamiVUDine (TRIUMEQ) 600-50-300 MG tablet Take 1 tablet by mouth daily. 02/14/18   Truman Hayward, MD  ALPRAZolam Duanne Moron) 0.25 MG tablet Take 1 tablet (0.25 mg total) by mouth 2 (two) times daily as needed for anxiety. 01/11/18   Hoyt Koch, MD  brompheniramine-pseudoephedrine-DM 30-2-10 MG/5ML syrup Take 5 mLs by mouth 4 (four) times daily as needed. 04/17/18   Helyn Schwan C, PA-C  fluticasone (FLONASE) 50 MCG/ACT nasal spray Place 1-2 sprays into both nostrils daily for 7 days. 04/17/18 04/24/18  Deirdra Heumann C, PA-C  imiquimod (ALDARA) 5 % cream Apply topically 3 (three) times a week. 03/22/17   Nche, Charlene Brooke, NP  Pitavastatin Calcium 4 MG TABS Take 1 tablet (4 mg total) by mouth daily. 07/18/14   Truman Hayward, MD  sildenafil (VIAGRA) 100 MG tablet Take 0.5-1 tablets (50-100 mg total) by mouth daily as needed for erectile dysfunction. 01/11/18   Hoyt Koch, MD  testosterone  cypionate (DEPOTESTOSTERONE CYPIONATE) 200 MG/ML injection INJECT 1 MILLILITER INTO THE MUSCLE WEEKLY 03/20/18   Tommy Medal, Lavell Islam, MD  testosterone cypionate (DEPOTESTOTERONE CYPIONATE) 100 MG/ML injection INJECT 1.5 ML INTRAMUSCULARLY EACH WEEK 11/23/17   Tommy Medal, Lavell Islam, MD    Family History Family History  Problem Relation Age of Onset  . Cancer Father 7       unknown primary  . Alcohol abuse Father   . Cirrhosis Father        alcohol  . Alcohol abuse Mother   . Hypertension Mother   . Hyperlipidemia Brother   . Macular degeneration Brother         histoplasm  . Hypertension Sister 48       carotid blockage    Social History Social History   Tobacco Use  . Smoking status: Current Some Day Smoker    Packs/day: 1.00    Types: Cigarettes    Last attempt to quit: 07/22/2015    Years since quitting: 2.7  . Smokeless tobacco: Never Used  Substance Use Topics  . Alcohol use: Yes    Alcohol/week: 0.0 standard drinks    Comment: occasional, socially  . Drug use: No     Allergies   Sulfonamide derivatives   Review of Systems Review of Systems  Constitutional: Negative for activity change, appetite change, chills, fatigue and fever.  HENT: Positive for congestion, rhinorrhea, sinus pressure and sore throat. Negative for ear pain and trouble swallowing.   Eyes: Negative for discharge and redness.  Respiratory: Positive for cough. Negative for chest tightness and shortness of breath.   Cardiovascular: Negative for chest pain.  Gastrointestinal: Negative for abdominal pain, diarrhea, nausea and vomiting.  Musculoskeletal: Negative for myalgias.  Skin: Negative for rash.  Neurological: Negative for dizziness, light-headedness and headaches.     Physical Exam Triage Vital Signs ED Triage Vitals  Enc Vitals Group     BP 04/17/18 1105 119/81     Pulse Rate 04/17/18 1105 70     Resp 04/17/18 1105 18     Temp 04/17/18 1105 97.6 F (36.4 C)     Temp Source 04/17/18 1105 Oral     SpO2 04/17/18 1105 99 %     Weight --      Height --      Head Circumference --      Peak Flow --      Pain Score 04/17/18 1106 4     Pain Loc --      Pain Edu? --      Excl. in [place]? --    No data found.  Updated Vital Signs BP 119/81 (BP Location: Right Arm)   Pulse 70   Temp 97.6 F (36.4 C) (Oral)   Resp 18   SpO2 99%   Visual Acuity Right Eye Distance:   Left Eye Distance:   Bilateral Distance:    Right Eye Near:   Left Eye Near:    Bilateral Near:     Physical Exam  Constitutional: He appears well-developed and  well-nourished.  HENT:  Head: Normocephalic and atraumatic.  Bilateral ears without tenderness to palpation of external auricle, tragus and mastoid, EAC's without erythema or swelling, TM's with good bony landmarks and cone of light. Non erythematous. Nasal mucosa erythematous with swollen turbinates, clear rhinorrhea present bilaterally Oral mucosa pink and moist, no tonsillar enlargement or exudate. Posterior pharynx patent and erythematous, no uvula deviation or swelling. Normal phonation.  Eyes: Conjunctivae are normal.  Neck:  Neck supple.  Cardiovascular: Normal rate and regular rhythm.  No murmur heard. Pulmonary/Chest: Effort normal and breath sounds normal. No respiratory distress.  Breathing comfortably at rest, CTABL, no wheezing, rales or other adventitious sounds auscultated  Abdominal: Soft. There is no tenderness.  Musculoskeletal: He exhibits no edema.  Neurological: He is alert.  Skin: Skin is warm and dry.  Psychiatric: He has a normal mood and affect.  Nursing note and vitals reviewed.    UC Treatments / Results  Labs (all labs ordered are listed, but only abnormal results are displayed) Labs Reviewed  CULTURE, GROUP A STREP [facility])  POCT RAPID STREP A    EKG None  Radiology No results found.  Procedures Procedures (including critical care time)  Medications Ordered in UC Medications - No data to display  Initial Impression / Assessment and Plan / UC Course  I have reviewed the triage vital signs and the nursing notes.  Pertinent labs & imaging results that were available during my care of the patient were reviewed by me and considered in my medical decision making (see chart for details).     URI symptoms x2 days, strep negative, lungs clear, will treat symptomatically, likely viral etiology.  Recommendations below.Discussed strict return precautions. Patient verbalized understanding and is agreeable with plan.  Final Clinical Impressions(s) / UC  Diagnoses   Final diagnoses:  Sore throat  Viral URI with cough     Discharge Instructions     You likely having a viral upper respiratory infection. We recommended symptom control. I expect your symptoms to start improving in the next 1-2 weeks.   1. Take a daily allergy pill/anti-histamine like Zyrtec, Claritin, or Store brand consistently for 2 weeks  2. For congestion you may try an oral decongestant like Mucinex or sudafed. You may also try intranasal flonase nasal spray or saline irrigations (neti pot, sinus cleanse)  3. For your sore throat you may try tylneol, ibuprofen. cepacol lozenges, salt water gargles, throat spray. Treatment of congestion/drainage may also help your sore throat.  4. For cough you may try Robitussen, Mucinex DM or cough syrup provided  5. Take Tylenol or Ibuprofen to help with pain/inflammation  6. Stay hydrated, drink plenty of fluids to keep throat coated and less irritated  Honey Tea For cough/sore throat try using a honey-based tea. Use 3 teaspoons of honey with juice squeezed from half lemon. Place shaved pieces of ginger into 1/2-1 cup of water and warm over stove top. Then mix the ingredients and repeat every 4 hours as needed.   ED Prescriptions    Medication Sig Dispense Auth. Provider   fluticasone (FLONASE) 50 MCG/ACT nasal spray Place 1-2 sprays into both nostrils daily for 7 days. 1 g Jance Siek C, PA-C   brompheniramine-pseudoephedrine-DM 30-2-10 MG/5ML syrup Take 5 mLs by mouth 4 (four) times daily as needed. 120 mL Uldine Fuster C, PA-C     Controlled Substance Prescriptions [place] Controlled Substance Registry consulted? Not Applicable   Janith Lima, [place] 04/17/18 1139

## 2018-04-19 LAB — CULTURE, GROUP A STREP (THRC)

## 2018-04-30 ENCOUNTER — Other Ambulatory Visit: Payer: Managed Care, Other (non HMO)

## 2018-05-07 ENCOUNTER — Ambulatory Visit (INDEPENDENT_AMBULATORY_CARE_PROVIDER_SITE_OTHER): Payer: Managed Care, Other (non HMO) | Admitting: Infectious Disease

## 2018-05-07 ENCOUNTER — Other Ambulatory Visit: Payer: Self-pay

## 2018-05-07 ENCOUNTER — Telehealth: Payer: Self-pay

## 2018-05-07 ENCOUNTER — Encounter: Payer: Self-pay | Admitting: Infectious Disease

## 2018-05-07 ENCOUNTER — Other Ambulatory Visit (HOSPITAL_COMMUNITY)
Admission: RE | Admit: 2018-05-07 | Discharge: 2018-05-07 | Disposition: A | Discharge status: Home / Self Care | Payer: Managed Care, Other (non HMO) | Source: Ambulatory Visit | Attending: Infectious Disease | Admitting: Infectious Disease

## 2018-05-07 VITALS — BP 112/73 | HR 80 | Temp 98.5°F | Ht 69.0 in | Wt 175.0 lb

## 2018-05-07 DIAGNOSIS — F17200 Nicotine dependence, unspecified, uncomplicated: Secondary | ICD-10-CM | POA: Diagnosis not present

## 2018-05-07 DIAGNOSIS — Z23 Encounter for immunization: Secondary | ICD-10-CM

## 2018-05-07 DIAGNOSIS — B20 Human immunodeficiency virus [HIV] disease: Secondary | ICD-10-CM | POA: Diagnosis not present

## 2018-05-07 DIAGNOSIS — D751 Secondary polycythemia: Secondary | ICD-10-CM | POA: Insufficient documentation

## 2018-05-07 DIAGNOSIS — R7989 Other specified abnormal findings of blood chemistry: Secondary | ICD-10-CM | POA: Diagnosis not present

## 2018-05-07 HISTORY — DX: Nicotine dependence, unspecified, uncomplicated: F17.200

## 2018-05-07 HISTORY — DX: Secondary polycythemia: D75.1

## 2018-05-07 HISTORY — DX: Other specified abnormal findings of blood chemistry: R79.89

## 2018-05-07 MED ORDER — BICTEGRAVIR-EMTRICITAB-TENOFOV 50-200-25 MG PO TABS: 1.0000 | ORAL_TABLET | Freq: Every day | ORAL | 11 refills | Status: DC

## 2018-05-07 MED ORDER — TESTOSTERONE CYPIONATE 100 MG/ML IM SOLN: 150.0000 mg | INTRAMUSCULAR | 4 refills | Status: DC

## 2018-05-07 NOTE — Telephone Encounter (Signed)
Per patient called [facility] to cancel prescription for Biktarvy. Spoke with pharmacist who was able to cancel order that was sent in error.  New prescription sent to [facility] mail order pharmacy. [place]

## 2018-05-07 NOTE — Progress Notes (Signed)
Subjective:   Chief complaint:Her for follow-up of his HIV  Patient ID: [name], male    DOB: 02-04-1966, 52 y.o.   MRN: [id number]  HPI  [name] is a 52 y.o. male with HIV/AIDS who is doing well on his current ARV of [place] with controlled virus and healthy CD4.   We were also treating him for low testosterone with IM injections of testosterone.  Rent trouble with some secondary polycythemia and backed his dose to 1.5 mL.  He tried his best he could though he was getting aliquots of milligrams per milliliter.  Check this today again.  In reviewing his polycythemia he also is a smoker.  So certainly he could be getting polycythemia from smoking and testosterone.  He is smoking conventional cigarettes which of course would be worse from the second point of spite secondary polycythemia.      Lab Results  Component Value Date   HIV1RNAQUANT <20 NOT DETECTED 03/28/2018   HIV1RNAQUANT <20 DETECTED (A) 07/10/2017   HIV1RNAQUANT <20 NOT DETECTED 12/08/2016     Lab Results  Component Value Date   CD4TABS 430 03/28/2018   CD4TABS 490 07/10/2017   CD4TABS 390 (L) 12/08/2016      Past Medical History:  Diagnosis Date  . ALLERGIC RHINITIS   . Anal fissure    Chronic, recurrent anal bleeding with pain  . Depression   . Grieving 12/09/2015  . HIV INFECTION 07/05/2010 dx  . 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 Low testosterone 05/07/2018  . NEUTROPENIA, DRUG-INDUCED    on chemo early 2012  . 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 Problems in relationship with spouse or partner 12/09/2015  . 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 Thrombocytopenia ([place])    resolved  . Toe fracture, right 10/04/2012   great toe,  nonsurgical mgmt Sharol Given)    Past Surgical History:  Procedure Laterality Date  . ANAL INTRAEPITHELIAL NEOPLASIA EXCISION  06/2011   wide exiscion of Sq cell ca  . LYMPH NODE BIOPSY  1975   L ingunial - nondx  . Monteggia fracture surgery  2010   L elbow  . NASAL SEPTUM SURGERY  2006  . Precancer lesion removed from anus  2012    Family History  Problem Relation Age of Onset  . Cancer Father 5       unknown primary  . Alcohol abuse Father   . Cirrhosis Father        alcohol  . Alcohol abuse Mother   . Hypertension Mother   . Hyperlipidemia Brother   . Macular degeneration Brother        histoplasm  . Hypertension Sister 83       carotid blockage      Social History   Socioeconomic History  . Marital status: Married    Spouse name: Not on file  . Number of children: Not on file  . Years of education: Not on file  . Highest education level: Not on file  Occupational History  . Not on file  Social Needs  . Financial resource strain: Not on file  . Food insecurity:    Worry: Not on file    Inability: Not on file  . Transportation needs:  Medical: Not on file    Non-medical: Not on file  Tobacco Use  . Smoking status: Current Some Day Smoker    Packs/day: 1.00    Types: Cigarettes    Last attempt to quit: 07/22/2015    Years since quitting: 2.7  . Smokeless tobacco: Never Used  Substance and Sexual Activity  . Alcohol use: Yes    Alcohol/week: 0.0 standard drinks    Comment: occasional, socially  . Drug use: No  . Sexual activity: Yes    Comment: delined condoms  Lifestyle  . Physical activity:    Days per week: Not on file    Minutes per session: Not on file  . Stress: Not on file  Relationships  . Social connections:    Talks on phone: Not on file    Gets together: Not on file    Attends religious service: Not on file    Active member of club or organization: Not on file    Attends meetings of clubs or organizations: Not on file    Relationship  status: Not on file  Other Topics Concern  . Not on file  Social History Narrative   Married - Leonardo   employed at [employer]    Allergies  Allergen Reactions  . Sulfonamide Derivatives     REACTION: rash, swelling, trouble breathing     Current Outpatient Medications:  .  abacavir-dolutegravir-lamiVUDine (TRIUMEQ) 600-50-300 MG tablet, Take 1 tablet by mouth daily., Disp: 30 tablet, Rfl: 5 .  ALPRAZolam (XANAX) 0.25 MG tablet, Take 1 tablet (0.25 mg total) by mouth 2 (two) times daily as needed for anxiety., Disp: 180 tablet, Rfl: 1 .  brompheniramine-pseudoephedrine-DM 30-2-10 MG/5ML syrup, Take 5 mLs by mouth 4 (four) times daily as needed., Disp: 120 mL, Rfl: 0 .  imiquimod (ALDARA) 5 % cream, Apply topically 3 (three) times a week., Disp: 12 each, Rfl: 0 .  Pitavastatin Calcium 4 MG TABS, Take 1 tablet (4 mg total) by mouth daily., Disp: 30 tablet, Rfl: 11 .  sildenafil (VIAGRA) 100 MG tablet, Take 0.5-1 tablets (50-100 mg total) by mouth daily as needed for erectile dysfunction., Disp: 10 tablet, Rfl: 6 .  testosterone cypionate (DEPOTESTOSTERONE CYPIONATE) 200 MG/ML injection, INJECT 1 MILLILITER INTO THE MUSCLE WEEKLY, Disp: 4 mL, Rfl: 0 .  testosterone cypionate (DEPOTESTOTERONE CYPIONATE) 100 MG/ML injection, INJECT 1.5 ML INTRAMUSCULARLY EACH WEEK, Disp: 60 mL, Rfl: 5 .  fluticasone (FLONASE) 50 MCG/ACT nasal spray, Place 1-2 sprays into both nostrils daily for 7 days., Disp: 1 g, Rfl: 0    Review of Systems  Constitutional: Negative for activity change, appetite change, chills, diaphoresis, fatigue, fever and unexpected weight change.  HENT: Negative for rhinorrhea, sinus pressure, sneezing and sore throat.   Eyes: Negative for visual disturbance.  Respiratory: Negative for chest tightness and wheezing.   Cardiovascular: Negative for chest pain, palpitations and leg swelling.  Gastrointestinal: Negative for abdominal distention, anal bleeding,  blood in stool, constipation and nausea.  Genitourinary: Negative for difficulty urinating, dysuria, flank pain and hematuria.  Musculoskeletal: Negative for arthralgias, back pain, gait problem, joint swelling and myalgias.  Skin: Negative for color change, pallor, rash and wound.  Neurological: Negative for dizziness, tremors, weakness and light-headedness.  Hematological: Negative for adenopathy. Does not bruise/bleed easily.  Psychiatric/Behavioral: Negative for agitation, behavioral problems, confusion, decreased concentration and sleep disturbance.       Objective:   Physical Exam  Constitutional: He is oriented to person, place, and time. He  appears well-developed and well-nourished.  HENT:  Head: Normocephalic and atraumatic.  Eyes: Conjunctivae and EOM are normal.  Neck: Normal range of motion. Neck supple.  Cardiovascular: Normal rate and regular rhythm.  Pulmonary/Chest: Effort normal. No respiratory distress. He has no wheezes.  Abdominal: Soft. He exhibits no distension.  Musculoskeletal: Normal range of motion. He exhibits no edema or tenderness.  Neurological: He is alert and oriented to person, place, and time.  Skin: Skin is warm and dry. No rash noted. No erythema. No pallor.  Psychiatric: He has a normal mood and affect. His behavior is normal. Judgment and thought content normal.          Assessment & Plan:   Secondary polycythemia: Try to get him to completely smoke stop smoking cigarettes.  Recheck CBC today and recheck testosterone scription to 100 mg/mL inject 1.5 mL every week.  Further on it.  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 Surgery   Low testosterone: see above  STI  screening:  rescreen from urine and extragenital sites when testing  I spent greater than 25 minutes with the patient including greater than 50% of time in face to face counsel of the patient re elevated testosterone and smoking as causes of polycythemia vera and different regimen single tablet regimens including BIKTARVY and Dovato as alternatives  and in coordination of his care.

## 2018-05-08 LAB — CBC WITH DIFFERENTIAL/PLATELET
BASOS ABS: 33 {cells}/uL (ref 0–200)
Basophils Relative: 0.5 %
EOS ABS: 139 {cells}/uL (ref 15–500)
Eosinophils Relative: 2.1 %
HCT: 46.4 % (ref 38.5–50.0)
HEMOGLOBIN: 16.1 g/dL (ref 13.2–17.1)
Lymphs Abs: 1465 cells/uL (ref 850–3900)
MCH: 34.6 pg — AB (ref 27.0–33.0)
MCHC: 34.7 g/dL (ref 32.0–36.0)
MCV: 99.8 fL (ref 80.0–100.0)
MONOS PCT: 11.4 %
MPV: 10.4 fL (ref 7.5–12.5)
Neutro Abs: 4211 cells/uL (ref 1500–7800)
Neutrophils Relative %: 63.8 %
PLATELETS: 228 10*3/uL (ref 140–400)
RBC: 4.65 10*6/uL (ref 4.20–5.80)
RDW: 12.1 % (ref 11.0–15.0)
TOTAL LYMPHOCYTE: 22.2 %
WBC mixed population: 752 cells/uL (ref 200–950)
WBC: 6.6 10*3/uL (ref 3.8–10.8)

## 2018-05-08 LAB — CYTOLOGY, (ORAL, ANAL, URETHRAL) ANCILLARY ONLY
Chlamydia: NEGATIVE
Chlamydia: POSITIVE — AB
NEISSERIA GONORRHEA: POSITIVE — AB
Neisseria Gonorrhea: NEGATIVE

## 2018-05-08 LAB — URINE CYTOLOGY ANCILLARY ONLY
CHLAMYDIA, DNA PROBE: NEGATIVE
NEISSERIA GONORRHEA: NEGATIVE

## 2018-05-08 LAB — TESTOSTERONE: Testosterone: 592 ng/dL (ref 250–827)

## 2018-05-09 ENCOUNTER — Telehealth: Payer: Self-pay

## 2018-05-09 ENCOUNTER — Ambulatory Visit (INDEPENDENT_AMBULATORY_CARE_PROVIDER_SITE_OTHER): Payer: Managed Care, Other (non HMO)

## 2018-05-09 DIAGNOSIS — A64 Unspecified sexually transmitted disease: Secondary | ICD-10-CM

## 2018-05-09 DIAGNOSIS — A549 Gonococcal infection, unspecified: Secondary | ICD-10-CM

## 2018-05-09 DIAGNOSIS — B20 Human immunodeficiency virus [HIV] disease: Secondary | ICD-10-CM

## 2018-05-09 MED ORDER — AZITHROMYCIN 250 MG PO TABS
1000.0000 mg | ORAL_TABLET | Freq: Once | ORAL | Status: AC
  Administered 2018-05-09: 1000 mg via ORAL

## 2018-05-09 MED ORDER — CEFTRIAXONE SODIUM 250 MG IJ SOLR
250.0000 mg | Freq: Once | INTRAMUSCULAR | Status: AC
  Administered 2018-05-09: 250 mg via INTRAMUSCULAR

## 2018-05-09 NOTE — Telephone Encounter (Signed)
Patient returned my call regarding my last message. Informed patient about that his CBC came back normal, and could continue current dose of Testosterone. Also informed patient he did test positive for Gonorrhea/ Chlamydia, and would need to schedule an appointment for treatment. Patient will come into office today for treatment. Is aware to have recent partners tested/ treated at health department. [place]

## 2018-05-09 NOTE — Telephone Encounter (Signed)
-----   Message from Truman Hayward, MD sent at 05/08/2018  1:49 PM EST ----- Needs to come in for ceftriaxone 250 mg intramuscularly +1 g of azithromycin gonorrhea and chlamydia.  Sexual partners need to be tested and treated.  They should also be referred for prep if they are not HIV positive.  I would also give [place] an additional week of doxycycline 100 mg twice daily

## 2018-05-09 NOTE — Progress Notes (Signed)
Patient came into office today for STI treatment. Patient tested positive for Gonorrhea/ Chlamydia. Per Dr, Tommy Medal orders treated patient today.  Patient confirms he informed recent partners to get tested/ treated at health department. Accepted condoms at his visit. Answered questions patient had about lab work and testing.  Had patient wait in exam room for a few minutes after treatment administration for observation, Patient feels well after visit and will call office if he has any additional questions. Is aware to abstain from sexual activity for two weeks for treatment to get in full effect. [place]

## 2018-05-09 NOTE — Telephone Encounter (Signed)
Error - duplicate

## 2018-05-09 NOTE — Telephone Encounter (Signed)
Per Dr. Tommy Medal attempted to call patient with lab results. Patient tested positive for Gonorrhea and Chlamydia. Needs 250 mg Ceftriaxone and 1g of azithromycin. Also need to inform patient about his CBC being normalized and can continue current dose of Testosterone.  Unable to reach patient at this time. Left voicemail for patient to call office back. [place]

## 2018-05-15 ENCOUNTER — Ambulatory Visit: Payer: Self-pay

## 2018-05-15 NOTE — Telephone Encounter (Signed)
Pt c/o mild chest pain to both sides of breast bone and upper back. Pt stated that the pain is constant and has been present for 1.5 weeks. Pt stated pain is worse with exertion and stated that it hurts when he takes a deep breath. Pt in no distress at the time of the call. Pt is a smoker. Pt h/o non hodgkin's lymphoma and pt is worried that his cancer is back. Pt stated he is sweating more at night. Disposition stated to go to ED. Called PCP office and updated [place] who stated to make pt an office visit instead. Care advice given to pt and pt verbalized understanding. Appointment given for tomorrow morning with PCP.  Reason for Disposition . Taking a deep breath makes pain worse  Answer Assessment - Initial Assessment Questions 1. LOCATION: "Where does it hurt?"       Breat bone both side and into the back 2. RADIATION: "Does the pain go anywhere else?" (e.g., into neck, jaw, arms, back)     Upper back between the shoulder blades 3. ONSET: "When did the chest pain begin?" (Minutes, hours or days)      1.5 weeks 4. PATTERN "Does the pain come and go, or has it been constant since it started?"  "Does it get worse with exertion?"      Constant- gets worse with exertion 5. DURATION: "How long does it last" (e.g., seconds, minutes, hours)     constant 6. SEVERITY: "How bad is the pain?"  (e.g., Scale 1-10; mild, moderate, or severe)    - MILD (1-3): doesn't interfere with normal activities     - MODERATE (4-7): interferes with normal activities or awakens from sleep    - SEVERE (8-10): excruciating pain, unable to do any normal activities       mild 7. CARDIAC RISK FACTORS: "Do you have any history of heart problems or risk factors for heart disease?" (e.g., prior heart attack, angina; high blood pressure, diabetes, being overweight, high cholesterol, smoking, or strong family history of heart disease)     Smokes, chem for 6 months 8. PULMONARY RISK FACTORS: "Do you have any history of lung  disease?"  (e.g., blood clots in lung, asthma, emphysema, birth control pills)     No- lymphoma in lung liver back of throat 9. CAUSE: "What do you think is causing the chest pain?"     Pt not sure - walking pneumonia 10. OTHER SYMPTOMS: "Do you have any other symptoms?" (e.g., dizziness, nausea, vomiting, sweating, fever, difficulty breathing, cough)       Sweating more at night, occasional cough  11. PREGNANCY: "Is there any chance you are pregnant?" "When was your last menstrual period?"       n/a  Protocols used: CHEST PAIN-A-AH

## 2018-05-16 ENCOUNTER — Encounter: Payer: Self-pay | Admitting: Internal Medicine

## 2018-05-16 ENCOUNTER — Other Ambulatory Visit (INDEPENDENT_AMBULATORY_CARE_PROVIDER_SITE_OTHER): Payer: Managed Care, Other (non HMO)

## 2018-05-16 ENCOUNTER — Ambulatory Visit: Payer: Managed Care, Other (non HMO) | Admitting: Internal Medicine

## 2018-05-16 ENCOUNTER — Ambulatory Visit (INDEPENDENT_AMBULATORY_CARE_PROVIDER_SITE_OTHER)
Admission: RE | Admit: 2018-05-16 | Discharge: 2018-05-16 | Disposition: A | Discharge status: Home / Self Care | Payer: Managed Care, Other (non HMO) | Source: Ambulatory Visit | Attending: Internal Medicine | Admitting: Internal Medicine

## 2018-05-16 VITALS — BP 110/78 | HR 77 | Temp 98.4°F | Ht 69.0 in | Wt 174.0 lb

## 2018-05-16 DIAGNOSIS — R0789 Other chest pain: Secondary | ICD-10-CM

## 2018-05-16 LAB — CBC
HCT: 48.7 % (ref 39.0–52.0)
Hemoglobin: 16.6 g/dL (ref 13.0–17.0)
MCHC: 34.1 g/dL (ref 30.0–36.0)
MCV: 103 fl — ABNORMAL HIGH (ref 78.0–100.0)
Platelets: 231 K/uL (ref 150.0–400.0)
RBC: 4.73 Mil/uL (ref 4.22–5.81)
RDW: 12.8 % (ref 11.5–15.5)
WBC: 6.7 K/uL (ref 4.0–10.5)

## 2018-05-16 LAB — COMPREHENSIVE METABOLIC PANEL WITH GFR
ALT: 33 U/L (ref 0–53)
AST: 20 U/L (ref 0–37)
Albumin: 4.2 g/dL (ref 3.5–5.2)
Alkaline Phosphatase: 70 U/L (ref 39–117)
BUN: 11 mg/dL (ref 6–23)
CO2: 26 meq/L (ref 19–32)
Calcium: 9.2 mg/dL (ref 8.4–10.5)
Chloride: 105 meq/L (ref 96–112)
Creatinine, Ser: 1.02 mg/dL (ref 0.40–1.50)
GFR: 81.74 mL/min (ref >60.00)
Glucose, Bld: 108 mg/dL — ABNORMAL HIGH (ref 70–99)
Potassium: 4.3 meq/L (ref 3.5–5.1)
Sodium: 139 meq/L (ref 135–145)
Total Bilirubin: 0.4 mg/dL (ref 0.2–1.2)
Total Protein: 7 g/dL (ref 6.0–8.3)

## 2018-05-16 LAB — D-DIMER, QUANTITATIVE (NOT AT ARMC): D DIMER QUANT: 0.28 ug{FEU}/mL (ref <0.50)

## 2018-05-16 LAB — TROPONIN I: TNIDX: 0.01 ug/l (ref 0.00–0.06)

## 2018-05-16 NOTE — Assessment & Plan Note (Signed)
Given hx NHL needs CXR to rule out recurrence. He is having some symptoms which could be associated with that. EKG done in the office without change from prior. Given testosterone supplementation and smoking will check D-dimer to rule out PE. Given his increased blood counts recently due to high testosterone dosing this puts him at increased risk of heart attack and stroke (HIV also elevates his risk significantly) so checking troponin today which will be adequate to rule out ACS given symptoms are ongoing for 1-2 weeks. Checking CBC and CMP as well to rule out metabolic causes.

## 2018-05-16 NOTE — Patient Instructions (Signed)
The EKG is not changed from before. We are checking the blood work and x-ray today.

## 2018-05-16 NOTE — Progress Notes (Signed)
   Subjective:    Patient ID: [name], male    DOB: 28-May-1966, 52 y.o.   MRN: [id number]  HPI The patient is a 52 YO man coming in for chest pain. Started about 1-2 weeks ago. It is all the time since onset with any movement or breathing. If he sits perfectly still the pain is not noticeable. Denies breathing problems and normal activity level since that time. Pain goes through to the back and the back component is more severe than the frontal component. Deep breathing will hurt. Denies fevers or chills. Denies sick contacts. Denies recent long travel. Is a smoker and smoking about the same. Had a cold about 4-5 weeks ago but this is resolved and pain did not start during cold. Coughs a little from smoking but no different and no different sputum. Does have history of NHL and is having some more night sweats more than usual. Denies weight change. Symptoms are stable since onset. Has not tried anything for them.   Review of Systems  Constitutional: Positive for diaphoresis. Negative for activity change, appetite change, chills, fatigue, fever and unexpected weight change.  HENT: Negative.   Eyes: Negative.   Respiratory: Positive for cough. Negative for chest tightness and shortness of breath.   Cardiovascular: Positive for chest pain. Negative for palpitations and leg swelling.  Gastrointestinal: Negative for abdominal distention, abdominal pain, constipation, diarrhea, nausea and vomiting.  Musculoskeletal: Negative.   Skin: Negative.   Neurological: Negative.   Psychiatric/Behavioral: Negative.       Objective:   Physical Exam  Constitutional: He is oriented to person, place, and time. He appears well-developed and well-nourished.  HENT:  Head: Normocephalic and atraumatic.  Eyes: EOM are normal.  Neck: Normal range of motion.  Cardiovascular: Normal rate and regular rhythm.  Pulmonary/Chest: Effort normal and breath sounds normal. No respiratory distress. He has no wheezes. He  has no rales.  Abdominal: Soft. Bowel sounds are normal. He exhibits no distension. There is no tenderness. There is no rebound.  Musculoskeletal: He exhibits no edema.  Neurological: He is alert and oriented to person, place, and time. Coordination normal.  Skin: Skin is warm and dry.  Psychiatric: He has a normal mood and affect.   Vitals:   05/16/18 0801  BP: 110/78  Pulse: 77  Temp: 98.4 F (36.9 C)  TempSrc: Oral  SpO2: 99%  Weight: 174 lb (78.9 kg)  Height: 5\' 9"  (1.753 m)   EKG:     Assessment & Plan:  Visit time 25 minutes: greater than 50% of that time was spent in face to face counseling and coordination of care with the patient: counseled about various possibilities and EKG results as well as etiologies given complex medical history.

## 2018-06-09 ENCOUNTER — Encounter: Payer: Self-pay | Admitting: Internal Medicine

## 2018-06-12 MED ORDER — ALPRAZOLAM 0.25 MG PO TABS: 0.2500 mg | ORAL_TABLET | Freq: Two times a day (BID) | ORAL | 1 refills | Status: DC | PRN

## 2018-06-12 MED ORDER — SILDENAFIL CITRATE 100 MG PO TABS: 50.0000 mg | ORAL_TABLET | Freq: Every day | ORAL | 3 refills | Status: AC | PRN

## 2018-06-22 ENCOUNTER — Encounter (INDEPENDENT_AMBULATORY_CARE_PROVIDER_SITE_OTHER): Payer: Managed Care, Other (non HMO) | Admitting: *Deleted

## 2018-06-22 VITALS — BP 112/78 | HR 73 | Temp 98.2°F | Wt 175.5 lb

## 2018-06-22 DIAGNOSIS — Z006 Encounter for examination for normal comparison and control in clinical research program: Secondary | ICD-10-CM

## 2018-06-22 NOTE — Research (Signed)
[name] was here for his month 43 visit for Reprieve. He does report having an evaluation for chest pain in December with his PCP, which ruled any cardiac involvement but will be reported as part of the study. He denies any other problems and his adherence with the studyd rugs is 100%. He will be returning for study in May.

## 2018-06-26 ENCOUNTER — Other Ambulatory Visit: Payer: Managed Care, Other (non HMO)

## 2018-06-26 DIAGNOSIS — B20 Human immunodeficiency virus [HIV] disease: Secondary | ICD-10-CM

## 2018-06-28 LAB — HIV-1 RNA QUANT-NO REFLEX-BLD
HIV 1 RNA QUANT: NOT DETECTED {copies}/mL
HIV-1 RNA Quant, Log: <1.3 Log copies/mL

## 2018-06-28 LAB — CBC WITH DIFFERENTIAL/PLATELET
ABSOLUTE MONOCYTES: 581 {cells}/uL (ref 200–950)
BASOS PCT: 0.6 %
Basophils Absolute: 40 cells/uL (ref 0–200)
EOS PCT: 2 %
Eosinophils Absolute: 132 cells/uL (ref 15–500)
HEMATOCRIT: 50.8 % — AB (ref 38.5–50.0)
Hemoglobin: 17.7 g/dL — ABNORMAL HIGH (ref 13.2–17.1)
LYMPHS ABS: 1597 {cells}/uL (ref 850–3900)
MCH: 34.2 pg — ABNORMAL HIGH (ref 27.0–33.0)
MCHC: 34.8 g/dL (ref 32.0–36.0)
MCV: 98.3 fL (ref 80.0–100.0)
MPV: 10.8 fL (ref 7.5–12.5)
Monocytes Relative: 8.8 %
NEUTROS PCT: 64.4 %
Neutro Abs: 4250 cells/uL (ref 1500–7800)
PLATELETS: 221 10*3/uL (ref 140–400)
RBC: 5.17 10*6/uL (ref 4.20–5.80)
RDW: 12 % (ref 11.0–15.0)
Total Lymphocyte: 24.2 %
WBC: 6.6 10*3/uL (ref 3.8–10.8)

## 2018-07-04 ENCOUNTER — Ambulatory Visit (HOSPITAL_COMMUNITY)
Admission: EM | Admit: 2018-07-04 | Discharge: 2018-07-04 | Disposition: A | Discharge status: Home / Self Care | Payer: Managed Care, Other (non HMO) | Attending: Emergency Medicine | Admitting: Emergency Medicine

## 2018-07-04 ENCOUNTER — Encounter (HOSPITAL_COMMUNITY): Payer: Self-pay

## 2018-07-04 DIAGNOSIS — B20 Human immunodeficiency virus [HIV] disease: Secondary | ICD-10-CM

## 2018-07-04 DIAGNOSIS — Z113 Encounter for screening for infections with a predominantly sexual mode of transmission: Secondary | ICD-10-CM | POA: Diagnosis not present

## 2018-07-04 DIAGNOSIS — R21 Rash and other nonspecific skin eruption: Secondary | ICD-10-CM | POA: Insufficient documentation

## 2018-07-04 NOTE — ED Provider Notes (Signed)
[facility]    CSN: [id number] Arrival date & time: 07/04/18  0805     History   Chief Complaint Chief Complaint  Patient presents with  . SEXUALLY TRANSMITTED DISEASE    HPI [name] is a 53 y.o. male history of HIV, allergic rhinitis, non-Hodgkin's lymphoma presenting today for evaluation of a rash.  Patient states that he is noticed a rash to his arms and legs for the past 2 weeks.  Is also been involved on his palms and soles of his feet.  He was recommended by his infectious disease doctor to come and get tested for syphilis.  He does state that he possibly could have been exposed to ticks and has been in the woods recently.  Denies associated headache or fever.  Rash is relatively asymptomatic, but does have some mild itching on his chest area.  Last tested for syphilis in October and was nonreactive.  HPI  Past Medical History:  Diagnosis Date  . ALLERGIC RHINITIS   . Anal fissure    Chronic, recurrent anal bleeding with pain  . Depression   . Grieving 12/09/2015  . HIV INFECTION 07/05/2010 dx  . 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 Low testosterone 05/07/2018  . NEUTROPENIA, DRUG-INDUCED    on chemo early 2012  . 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 Problems in relationship with spouse or partner 12/09/2015  . 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 Smoker 05/07/2018  . 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 Thrombocytopenia ([place])    resolved  . Toe fracture, right 10/04/2012   great toe, nonsurgical mgmt Sharol Given)    Patient Active Problem List   Diagnosis Date Noted  . Other chest pain 05/16/2018  . Low testosterone 05/07/2018  . 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 Smoker 05/07/2018  . Routine general medical examination at a health  care facility 12/24/2015  . 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 Problems in relationship with spouse or partner 12/09/2015  . Hypogonadism in male 08/27/2014  . NHL (non-Hodgkin's lymphoma) ([place]) 05/26/2014  . Anxiety 06/08/2012  . Molluscum contagiosum 02/02/2011  . Human immunodeficiency virus (HIV) disease ([place]) 07/05/2010    Past Surgical History:  Procedure Laterality Date  . ANAL INTRAEPITHELIAL NEOPLASIA EXCISION  06/2011   wide exiscion of Sq cell ca  . LYMPH NODE BIOPSY  1975   L ingunial - nondx  . [place] fracture surgery  2010   L elbow  . NASAL SEPTUM SURGERY  2006  . Precancer lesion removed from anus  2012       Home Medications    Prior to Admission medications   Medication Sig Start Date End Date Taking? Authorizing Provider  ALPRAZolam (XANAX) 0.25 MG tablet Take 1 tablet (0.25 mg total) by mouth 2 (two) times daily as needed for anxiety. 06/12/18   Hoyt Koch, MD  bictegravir-emtricitabine-tenofovir AF (BIKTARVY) 50-200-25 MG TABS tablet Take 1 tablet by mouth daily. Patient not taking: Reported on 05/16/2018 05/07/18   Tommy Medal, Lavell Islam, MD  Pitavastatin Calcium 4 MG TABS Take 1 tablet (4 mg total) by mouth daily. 07/18/14   Truman Hayward, MD  sildenafil (VIAGRA) 100 MG tablet Take 0.5-1 tablets (50-100 mg total) by mouth daily as needed for erectile  dysfunction. 06/12/18   Hoyt Koch, MD  testosterone cypionate (DEPOTESTOTERONE CYPIONATE) 100 MG/ML injection Inject 1.5 mLs (150 mg total) into the muscle once a week. For IM use only 05/07/18   Tommy Medal, Lavell Islam, MD    Family History Family History  Problem Relation Age of Onset  . Cancer Father 82       unknown primary  . Alcohol abuse Father   . Cirrhosis Father        alcohol  . Alcohol abuse Mother   . Hypertension Mother   . Hyperlipidemia Brother   . Macular degeneration Brother        histoplasm  . Hypertension Sister 39       carotid blockage    Social  History Social History   Tobacco Use  . Smoking status: Current Some Day Smoker    Packs/day: 1.00    Types: Cigarettes    Last attempt to quit: 07/22/2015    Years since quitting: 2.9  . Smokeless tobacco: Never Used  Substance Use Topics  . Alcohol use: Yes    Alcohol/week: 0.0 standard drinks    Comment: occasional, socially  . Drug use: No     Allergies   Sulfonamide derivatives   Review of Systems Review of Systems  Constitutional: Negative for fever.  HENT: Negative for sore throat.   Respiratory: Negative for shortness of breath.   Cardiovascular: Negative for chest pain.  Gastrointestinal: Negative for abdominal pain, nausea and vomiting.  Genitourinary: Negative for difficulty urinating, discharge, dysuria, frequency, penile pain, penile swelling, scrotal swelling and testicular pain.  Skin: Positive for color change and rash.  Neurological: Negative for dizziness, light-headedness and headaches.     Physical Exam Triage Vital Signs ED Triage Vitals  Enc Vitals Group     BP 07/04/18 0821 118/82     Pulse Rate 07/04/18 0821 81     Resp 07/04/18 0821 18     Temp 07/04/18 0821 98.4 F (36.9 C)     Temp Source 07/04/18 0821 Oral     SpO2 07/04/18 0821 98 %     Weight --      Height --      Head Circumference --      Peak Flow --      Pain Score 07/04/18 0822 0     Pain Loc --      Pain Edu? --      Excl. in [place]? --    No data found.  Updated Vital Signs BP 118/82 (BP Location: Right Arm)   Pulse 81   Temp 98.4 F (36.9 C) (Oral)   Resp 18   SpO2 98%   Visual Acuity Right Eye Distance:   Left Eye Distance:   Bilateral Distance:    Right Eye Near:   Left Eye Near:    Bilateral Near:     Physical Exam Vitals signs and nursing note reviewed.  Constitutional:      Appearance: He is well-developed.     Comments: No acute distress  HENT:     Head: Normocephalic and atraumatic.     Nose: Nose normal.     Mouth/Throat:     Comments: No  lesions on oral mucosa, oral mucosa pink, nonerythematous, posterior pharynx patent, no uvula deviation Eyes:     Conjunctiva/sclera: Conjunctivae normal.  Neck:     Musculoskeletal: Neck supple.  Cardiovascular:     Rate and Rhythm: Normal rate.  Pulmonary:     Effort:  Pulmonary effort is normal. No respiratory distress.  Abdominal:     General: There is no distension.  Musculoskeletal: Normal range of motion.  Skin:    General: Skin is warm and dry.     Comments: Patient has isolated erythematous circular lesions to palms as well as soles of left foot, circular erythematous lesions on chest, proximal thighs and neck  Neurological:     Mental Status: He is alert and oriented to person, place, and time.            UC Treatments / Results  Labs (all labs ordered are listed, but only abnormal results are displayed) Labs Reviewed  RPR  ROCKY MTN SPOTTED FVR ABS PNL(IGG+IGM)  URINE CYTOLOGY ANCILLARY ONLY    EKG None  Radiology No results found.  Procedures Procedures (including critical care time)  Medications Ordered in UC Medications - No data to display  Initial Impression / Assessment and Plan / UC Course  I have reviewed the triage vital signs and the nursing notes.  Pertinent labs & imaging results that were available during my care of the patient were reviewed by me and considered in my medical decision making (see chart for details).     Patient does have rash presenting on palms and soles, will check for syphilis with RPR as well as [facility] spotted fever antibodies.  In the interim we will treat symptomatically, Zyrtec daily, Benadryl at nighttime.  Will call patient with results and provide any further treatment if needed.  Also checking urine cytology.Discussed strict return precautions. Patient verbalized understanding and is agreeable with plan.  Final Clinical Impressions(s) / UC Diagnoses   Final diagnoses:  Rash and nonspecific skin  eruption  Screening examination for STD (sexually transmitted disease)     Discharge Instructions     We are checking you for gonorrhea, chlamydia, trichomonas, with your urine.  These results return in 1 to 2 days  We are checking syphilis as well as [facility] spotted fever as a cause of this rash.  We will call you with the results and provide any further treatment if needed. Please follow-up with infectious disease syphilis is positive  If you are having any itching you may take a daily Zyrtec or Benadryl at nighttime   ED Prescriptions    None     Controlled Substance Prescriptions [place] Controlled Substance Registry consulted? Not Applicable   Janith Lima, [place] 07/04/18 5638

## 2018-07-04 NOTE — ED Triage Notes (Signed)
Pt c/o rash starting on his legs and has worked its way up to chest, arms and neck in the past 2wks. States send echart message to his PCP and states it may be syphilis.

## 2018-07-04 NOTE — Discharge Instructions (Signed)
We are checking you for gonorrhea, chlamydia, trichomonas, with your urine.  These results return in 1 to 2 days  We are checking syphilis as well as [facility] spotted fever as a cause of this rash.  We will call you with the results and provide any further treatment if needed. 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

## 2018-07-05 LAB — URINE CYTOLOGY ANCILLARY ONLY
CHLAMYDIA, DNA PROBE: NEGATIVE
NEISSERIA GONORRHEA: NEGATIVE
Trichomonas: NEGATIVE

## 2018-07-06 ENCOUNTER — Telehealth (HOSPITAL_COMMUNITY): Payer: Self-pay | Admitting: Emergency Medicine

## 2018-07-06 LAB — ROCKY MTN SPOTTED FVR ABS PNL(IGG+IGM)
RMSF IgG: NEGATIVE
RMSF IgM: 1.03 index — ABNORMAL HIGH (ref 0.00–0.89)

## 2018-07-06 NOTE — Telephone Encounter (Signed)
Per Dr. Valere Dross, if pt does not have fever, will wait for rpr test for further instructions. If pt is febrile to send in doxy. Inconclusive test at this time. Attempted to reach patient. No answer at this time. Voicemail left.

## 2018-07-07 ENCOUNTER — Ambulatory Visit (HOSPITAL_COMMUNITY)
Admission: EM | Admit: 2018-07-07 | Discharge: 2018-07-07 | Disposition: A | Discharge status: Home / Self Care | Payer: Managed Care, Other (non HMO) | Attending: Family Medicine | Admitting: Family Medicine

## 2018-07-07 DIAGNOSIS — A539 Syphilis, unspecified: Secondary | ICD-10-CM

## 2018-07-07 LAB — RPR: RPR: REACTIVE — AB

## 2018-07-07 LAB — RPR, QUANT+TP ABS (REFLEX): T Pallidum Abs: REACTIVE — AB

## 2018-07-07 MED ORDER — PENICILLIN G BENZATHINE 1200000 UNIT/2ML IM SUSP
INTRAMUSCULAR | Status: AC
  Filled 2018-07-07: qty 4

## 2018-07-07 MED ORDER — PENICILLIN G BENZATHINE 1200000 UNIT/2ML IM SUSP
2.4000 10*6.[IU] | Freq: Once | INTRAMUSCULAR | Status: AC
  Administered 2018-07-07: 2.4 10*6.[IU] via INTRAMUSCULAR

## 2018-07-07 NOTE — ED Triage Notes (Signed)
Pt here for treatment of syphilis.

## 2018-07-09 ENCOUNTER — Telehealth (HOSPITAL_COMMUNITY): Payer: Self-pay | Admitting: Emergency Medicine

## 2018-09-08 ENCOUNTER — Other Ambulatory Visit: Payer: Self-pay | Admitting: Internal Medicine

## 2018-10-10 ENCOUNTER — Encounter: Payer: Self-pay | Admitting: Internal Medicine

## 2018-10-26 ENCOUNTER — Telehealth: Payer: Self-pay | Admitting: *Deleted

## 2018-10-26 NOTE — Telephone Encounter (Signed)
[name] completed his month 60 Reprieve visit over the phone. He is taking study medication and Biktarvy daily. He reports no muscle aches, no muscle weakness, and no medication changes. His address was verified for medication shipment. He will return for his month 17 visit on 9/17.

## 2018-11-20 ENCOUNTER — Telehealth: Payer: Self-pay | Admitting: Internal Medicine

## 2018-11-20 ENCOUNTER — Encounter: Payer: Self-pay | Admitting: Internal Medicine

## 2018-11-20 DIAGNOSIS — Z20822 Contact with and (suspected) exposure to covid-19: Secondary | ICD-10-CM

## 2018-11-20 NOTE — Telephone Encounter (Signed)
Dr. Sharlet Salina is referring this patient for covid-19 testing. He has chronic medical conditions and has been out in the public.  He is scheduled for tomorrow June 17 th at 8 am at the [facility] site. Advised that this is a drive thru test site, wear a mask, stay in the car with windows rolled up until ready for testing. Pt voiced understanding.

## 2018-11-20 NOTE — Telephone Encounter (Signed)
Please send message to [facility] pool for testing. Cough and congestion in high risk individual (attended many rallies in the last several weeks).

## 2018-11-21 ENCOUNTER — Other Ambulatory Visit: Payer: Managed Care, Other (non HMO)

## 2018-11-21 DIAGNOSIS — Z20822 Contact with and (suspected) exposure to covid-19: Secondary | ICD-10-CM

## 2018-11-24 LAB — NOVEL CORONAVIRUS, NAA: SARS-CoV-2, NAA: NOT DETECTED

## 2018-12-02 ENCOUNTER — Other Ambulatory Visit: Payer: Self-pay | Admitting: Infectious Disease

## 2018-12-05 ENCOUNTER — Telehealth: Payer: Self-pay | Admitting: *Deleted

## 2018-12-05 NOTE — Telephone Encounter (Signed)
Patient/pharmacy requesting refill of testosterone. Please advise if ok to fill or if should be held until his labs on 12/11/2018.  Do you need specific labs for this dosing? If so, please add. Thank you! Landis Gandy, RN

## 2018-12-06 ENCOUNTER — Other Ambulatory Visit: Payer: Self-pay | Admitting: Internal Medicine

## 2018-12-06 NOTE — Telephone Encounter (Signed)
Ok to refill but I actually would like [facility] to transition to being seen by endocrine for this given my anxiety re balancing his thrombophilia on tesosterone and trying to get dose exactly right  When is he coming for labs?

## 2018-12-06 NOTE — Telephone Encounter (Signed)
Left message for patient to call back to schedule.  °

## 2018-12-06 NOTE — Telephone Encounter (Signed)
Can you please schedule patient for a physical. Thank you

## 2018-12-06 NOTE — Telephone Encounter (Signed)
Control database checked last refill:09/10/2018 LOV: 05/16/2018 acute, 03/29/2017 anxiety  DIX:[id number]

## 2018-12-06 NOTE — Telephone Encounter (Signed)
Needs physical for refills past 1 month

## 2018-12-06 NOTE — Telephone Encounter (Signed)
7/7, 3:45 pm. Any additional lab orders for 7/7?

## 2018-12-10 ENCOUNTER — Other Ambulatory Visit: Payer: Self-pay | Admitting: Infectious Disease

## 2018-12-10 DIAGNOSIS — E291 Testicular hypofunction: Secondary | ICD-10-CM

## 2018-12-10 MED ORDER — TESTOSTERONE CYPIONATE 200 MG/ML IM SOLN: INTRAMUSCULAR | 0 refills | Status: DC

## 2018-12-10 NOTE — Telephone Encounter (Signed)
I put in orders for testosterone and PSA  I will be at virtual AIDS conference all week and Merry Proud covering my inbox

## 2018-12-10 NOTE — Addendum Note (Signed)
Addended by: Eugenia Mcalpine on: 12/10/2018 12:28 PM   Modules accepted: Orders

## 2018-12-11 ENCOUNTER — Other Ambulatory Visit: Payer: Managed Care, Other (non HMO)

## 2018-12-11 ENCOUNTER — Other Ambulatory Visit: Payer: Self-pay

## 2018-12-11 DIAGNOSIS — B20 Human immunodeficiency virus [HIV] disease: Secondary | ICD-10-CM

## 2018-12-11 DIAGNOSIS — E291 Testicular hypofunction: Secondary | ICD-10-CM

## 2018-12-12 LAB — TESTOSTERONE: Testosterone: 492 ng/dL (ref 250–827)

## 2018-12-12 LAB — PSA: PSA: 1.5 ng/mL (ref <=4.0)

## 2018-12-12 LAB — T-HELPER CELL (CD4) - (RCID CLINIC ONLY)
CD4 % Helper T Cell: 32 % — ABNORMAL LOW (ref 33–65)
CD4 T Cell Abs: 680 /uL (ref 400–1790)

## 2018-12-13 ENCOUNTER — Telehealth: Payer: Self-pay

## 2018-12-13 NOTE — Telephone Encounter (Signed)
LVM for patient to call back and reschedule his Friday the 17th appointment as Dr. Sharlet Salina will be out of the office

## 2018-12-18 ENCOUNTER — Telehealth: Payer: Self-pay

## 2018-12-18 NOTE — Telephone Encounter (Signed)
Received fax from [facility] requesting Prior Auth for Testosterone cyp 200 mg/ML injection, 1ML    Message:  Plan does not cover this medicaiton. Please call plan at [phone] to initiate prior authorization or call/fax pharmacy to change medication. Patient ID # [id number].   Lenore Cordia, [place]

## 2018-12-18 NOTE — Telephone Encounter (Signed)
Called [facility] to initiate Prior Authorization for [employer].   Spoke to Lelia [place], and provided relevant information. Information sent to pharmacist for final approval.      Lenore Cordia, CMA

## 2018-12-19 LAB — COMPLETE METABOLIC PANEL WITH GFR
AG Ratio: 2 (calc) (ref 1.0–2.5)
ALT: 34 U/L (ref 9–46)
AST: 22 U/L (ref 10–35)
Albumin: 4.6 g/dL (ref 3.6–5.1)
Alkaline phosphatase (APISO): 70 U/L (ref 35–144)
BUN: 10 mg/dL (ref 7–25)
CO2: 26 mmol/L (ref 20–32)
Calcium: 9.3 mg/dL (ref 8.6–10.3)
Chloride: 105 mmol/L (ref 98–110)
Creat: 0.97 mg/dL (ref 0.70–1.33)
GFR, Est African American: 104 mL/min/{1.73_m2} (ref >=60)
GFR, Est Non African American: 90 mL/min/{1.73_m2} (ref >=60)
Globulin: 2.3 g/dL (calc) (ref 1.9–3.7)
Glucose, Bld: 78 mg/dL (ref 65–99)
Potassium: 3.9 mmol/L (ref 3.5–5.3)
Sodium: 140 mmol/L (ref 135–146)
Total Bilirubin: 0.5 mg/dL (ref 0.2–1.2)
Total Protein: 6.9 g/dL (ref 6.1–8.1)

## 2018-12-19 LAB — CBC WITH DIFFERENTIAL/PLATELET
Absolute Monocytes: 645 cells/uL (ref 200–950)
Basophils Absolute: 52 cells/uL (ref 0–200)
Basophils Relative: 0.5 %
Eosinophils Absolute: 94 cells/uL (ref 15–500)
Eosinophils Relative: 0.9 %
HCT: 48.7 % (ref 38.5–50.0)
Hemoglobin: 17.2 g/dL — ABNORMAL HIGH (ref 13.2–17.1)
Lymphs Abs: 2194 cells/uL (ref 850–3900)
MCH: 35.5 pg — ABNORMAL HIGH (ref 27.0–33.0)
MCHC: 35.3 g/dL (ref 32.0–36.0)
MCV: 100.6 fL — ABNORMAL HIGH (ref 80.0–100.0)
MPV: 11.4 fL (ref 7.5–12.5)
Monocytes Relative: 6.2 %
Neutro Abs: 7415 cells/uL (ref 1500–7800)
Neutrophils Relative %: 71.3 %
Platelets: 248 10*3/uL (ref 140–400)
RBC: 4.84 10*6/uL (ref 4.20–5.80)
RDW: 12.8 % (ref 11.0–15.0)
Total Lymphocyte: 21.1 %
WBC: 10.4 10*3/uL (ref 3.8–10.8)

## 2018-12-19 LAB — HIV-1 RNA QUANT-NO REFLEX-BLD
HIV 1 RNA Quant: <20 copies/mL — AB
HIV-1 RNA Quant, Log: <1.3 Log copies/mL — AB

## 2018-12-21 ENCOUNTER — Encounter: Payer: Managed Care, Other (non HMO) | Admitting: Internal Medicine

## 2018-12-26 ENCOUNTER — Other Ambulatory Visit: Payer: Self-pay

## 2018-12-26 ENCOUNTER — Ambulatory Visit (INDEPENDENT_AMBULATORY_CARE_PROVIDER_SITE_OTHER): Payer: Managed Care, Other (non HMO) | Admitting: Infectious Disease

## 2018-12-26 ENCOUNTER — Encounter: Payer: Self-pay | Admitting: Infectious Disease

## 2018-12-26 DIAGNOSIS — D751 Secondary polycythemia: Secondary | ICD-10-CM | POA: Diagnosis not present

## 2018-12-26 DIAGNOSIS — F17210 Nicotine dependence, cigarettes, uncomplicated: Secondary | ICD-10-CM

## 2018-12-26 DIAGNOSIS — E291 Testicular hypofunction: Secondary | ICD-10-CM | POA: Diagnosis not present

## 2018-12-26 DIAGNOSIS — B20 Human immunodeficiency virus [HIV] disease: Secondary | ICD-10-CM | POA: Diagnosis not present

## 2018-12-26 DIAGNOSIS — F17200 Nicotine dependence, unspecified, uncomplicated: Secondary | ICD-10-CM

## 2018-12-26 DIAGNOSIS — R7989 Other specified abnormal findings of blood chemistry: Secondary | ICD-10-CM

## 2018-12-26 NOTE — Progress Notes (Signed)
Virtual Visit via Telephone Note  I connected with [name] on 12/26/18 at  4:00 PM EDT by telephone and verified that I am speaking with the correct person using two identifiers.  Location: Patient: Home Provider: My home   I discussed the limitations, risks, security and privacy concerns of performing an evaluation and management service by telephone and the availability of in person appointments. I also discussed with the patient that there may be a patient responsible charge related to this service. The patient expressed understanding and agreed to proceed.   History of Present Illness:  [name] is a 53 year old Caucasian man living with HIV with prior history of non-Hodgkin lymphoma that was treated and cured.  He is maintained excellent virological suppression on Biktarvy.  He is also participating in the REPRIEVE study.  He has had low testosterone is on testosterone replacement but has had some secondary polycythemia likely partly due to the testosterone but also due to his ongoing smoking.       Observations/Objective:  HIV disease well-controlled  Hypogonadism  Secondary polycythemia  Smoking  Assessment and Plan:  HIV disease: Continue Biktarvy  Secondary polycythemia: I have asked him to stop smoking if at all possible we will check this to be a contributor.  We also could have adjustment of his testosterone dose potentially  Hypogonadism: Continue testosterone but care for polycythemia  Smoking asked him to stop smoking and he will try this by different means potentially involving using vaping techniques rather than smoking conventional cigarettes.  Does not want new medication such as Chantix  Follow Up Instructions:    I discussed the assessment and treatment plan with the patient. The patient was provided an opportunity to ask questions and all were answered. The patient agreed with the plan and demonstrated an understanding of the  instructions.   The patient was advised to call back or seek an in-person evaluation if the symptoms worsen or if the condition fails to improve as anticipated.  I provided 21  minutes of non-face-to-face time during this encounter.   Alcide Evener, MD

## 2019-01-01 ENCOUNTER — Other Ambulatory Visit: Payer: Self-pay

## 2019-01-01 ENCOUNTER — Ambulatory Visit (INDEPENDENT_AMBULATORY_CARE_PROVIDER_SITE_OTHER): Payer: Managed Care, Other (non HMO) | Admitting: Internal Medicine

## 2019-01-01 ENCOUNTER — Encounter: Payer: Self-pay | Admitting: Internal Medicine

## 2019-01-01 VITALS — BP 130/88 | HR 76 | Temp 98.2°F | Ht 69.0 in | Wt 186.0 lb

## 2019-01-01 DIAGNOSIS — F419 Anxiety disorder, unspecified: Secondary | ICD-10-CM

## 2019-01-01 DIAGNOSIS — Z0000 Encounter for general adult medical examination without abnormal findings: Secondary | ICD-10-CM

## 2019-01-01 DIAGNOSIS — F17210 Nicotine dependence, cigarettes, uncomplicated: Secondary | ICD-10-CM

## 2019-01-01 DIAGNOSIS — B20 Human immunodeficiency virus [HIV] disease: Secondary | ICD-10-CM

## 2019-01-01 DIAGNOSIS — Z23 Encounter for immunization: Secondary | ICD-10-CM | POA: Diagnosis not present

## 2019-01-01 DIAGNOSIS — F17200 Nicotine dependence, unspecified, uncomplicated: Secondary | ICD-10-CM

## 2019-01-01 MED ORDER — ALPRAZOLAM 0.25 MG PO TABS: 0.2500 mg | ORAL_TABLET | Freq: Two times a day (BID) | ORAL | 1 refills | Status: DC | PRN

## 2019-01-01 NOTE — Patient Instructions (Addendum)
[place] to see if they are still giving up free nicotine patches.   Health Maintenance, Male Adopting a healthy lifestyle and getting preventive care are important in promoting health and wellness. Ask your health care provider about:  The right schedule for you to have regular tests and exams.  Things you can do on your own to prevent diseases and keep yourself healthy. What should I know about diet, weight, and exercise? Eat a healthy diet   Eat a diet that includes plenty of vegetables, fruits, low-fat dairy products, and lean protein.  Do not eat a lot of foods that are high in solid fats, added sugars, or sodium. Maintain a healthy weight Body mass index (BMI) is a measurement that can be used to identify possible weight problems. It estimates body fat based on height and weight. Your health care provider can help determine your BMI and help you achieve or maintain a healthy weight. Get regular exercise Get regular exercise. This is one of the most important things you can do for your health. Most adults should:  Exercise for at least 150 minutes each week. The exercise should increase your heart rate and make you sweat (moderate-intensity exercise).  Do strengthening exercises at least twice a week. This is in addition to the moderate-intensity exercise.  Spend less time sitting. Even light physical activity can be beneficial. Watch cholesterol and blood lipids Have your blood tested for lipids and cholesterol at 52 years of age, then have this test every 5 years. You may need to have your cholesterol levels checked more often if:  Your lipid or cholesterol levels are high.  You are older than 52 years of age.  You are at high risk for heart disease. What should I know about cancer screening? Many types of cancers can be detected early and may often be prevented. Depending on your health history and family history, you may need to have cancer screening at various ages.  This may include screening for:  Colorectal cancer.  Prostate cancer.  Skin cancer.  Lung cancer. What should I know about heart disease, diabetes, and high blood pressure? Blood pressure and heart disease  High blood pressure causes heart disease and increases the risk of stroke. This is more likely to develop in people who have high blood pressure readings, are of African descent, or are overweight.  Talk with your health care provider about your target blood pressure readings.  Have your blood pressure checked: ? Every 3-5 years if you are 60-26 years of age. ? Every year if you are 20 years old or older.  If you are between the ages of 95 and 31 and are a current or former smoker, ask your health care provider if you should have a one-time screening for abdominal aortic aneurysm (AAA). Diabetes Have regular diabetes screenings. This checks your fasting blood sugar level. Have the screening done:  Once every three years after age 54 if you are at a normal weight and have a low risk for diabetes.  More often and at a younger age if you are overweight or have a high risk for diabetes. What should I know about preventing infection? Hepatitis B If you have a higher risk for hepatitis B, you should be screened for this virus. Talk with your health care provider to find out if you are at risk for hepatitis B infection. Hepatitis C Blood testing is recommended for:  Everyone born from [birth year] through 1964.  Anyone with known  risk factors for hepatitis C. Sexually transmitted infections (STIs)  You should be screened each year for STIs, including gonorrhea and chlamydia, if: ? You are sexually active and are younger than 52 years of age. ? You are older than 52 years of age and your health care provider tells you that you are at risk for this type of infection. ? Your sexual activity has changed since you were last screened, and you are at increased risk for chlamydia or gonorrhea.  Ask your health care provider if you are at risk.  Ask your health care provider about whether you are at high risk for HIV. Your health care provider may recommend a prescription medicine to help prevent HIV infection. If you choose to take medicine to prevent HIV, you should first get tested for HIV. You should then be tested every 3 months for as long as you are taking the medicine. Follow these instructions at home: Lifestyle  Do not use any products that contain nicotine or tobacco, such as cigarettes, e-cigarettes, and chewing tobacco. If you need help quitting, ask your health care provider.  Do not use street drugs.  Do not share needles.  Ask your health care provider for help if you need support or information about quitting drugs. Alcohol use  Do not drink alcohol if your health care provider tells you not to drink.  If you drink alcohol: ? Limit how much you have to 0-2 drinks a day. ? Be aware of how much alcohol is in your drink. In the [place] one drink equals one 12 oz bottle of beer (355 mL), one 5 oz glass of wine (148 mL), or one 1 oz glass of hard liquor (44 mL). General instructions  Schedule regular health, dental, and eye exams.  Stay current with your vaccines.  Tell your health care provider if: ? You often feel depressed. ? You have ever been abused or do not feel safe at home. Summary  Adopting a healthy lifestyle and getting preventive care are important in promoting health and wellness.  Follow your health care provider's instructions about healthy diet, exercising, and getting tested or screened for diseases.  Follow your health care provider's instructions on monitoring your cholesterol and blood pressure. This information is not intended to replace advice given to you by your health care provider. Make sure you discuss any questions you have with your health care provider. Document Released: 11/19/2007 Document Revised: 05/16/2018 Document Reviewed:  05/16/2018 Elsevier Patient Education  2020 [employer].

## 2019-01-01 NOTE — Assessment & Plan Note (Signed)
Refill xanax BID prn. He is having more anxiety with pandemic but coping with other strategies as well.

## 2019-01-01 NOTE — Assessment & Plan Note (Signed)
Flu shot yearly. Pneumonia up to date. Shingrix given 1st. Tetanus up to date. Colonoscopy up to date. Counseled about sun safety and mole surveillance. Counseled about the dangers of distracted driving. Given 10 year screening recommendations.

## 2019-01-01 NOTE — Progress Notes (Signed)
   Subjective:   Patient ID: [name], male    DOB: September 25, 1965, 53 y.o.   MRN: [id number]  HPI The patient is a 53 YO man coming in for physical.   PMH, [place], social history reviewed and updated  Review of Systems  Constitutional: Negative.   HENT: Negative.   Eyes: Negative.   Respiratory: Negative for cough, chest tightness and shortness of breath.   Cardiovascular: Negative for chest pain, palpitations and leg swelling.  Gastrointestinal: Negative for abdominal distention, abdominal pain, constipation, diarrhea, nausea and vomiting.  Musculoskeletal: Negative.   Skin: Negative.   Neurological: Negative.   Psychiatric/Behavioral: Negative.     Objective:  Physical Exam Constitutional:      Appearance: He is well-developed.  HENT:     Head: Normocephalic and atraumatic.  Neck:     Musculoskeletal: Normal range of motion.  Cardiovascular:     Rate and Rhythm: Normal rate and regular rhythm.  Pulmonary:     Effort: Pulmonary effort is normal. No respiratory distress.     Breath sounds: Normal breath sounds. No wheezing or rales.  Abdominal:     General: Bowel sounds are normal. There is no distension.     Palpations: Abdomen is soft.     Tenderness: There is no abdominal tenderness. There is no rebound.  Skin:    General: Skin is warm and dry.  Neurological:     Mental Status: He is alert and oriented to person, place, and time.     Coordination: Coordination normal.     Vitals:   01/01/19 1440  BP: 130/88  Pulse: 76  Temp: 98.2 F (36.8 C)  TempSrc: Oral  SpO2: 95%  Weight: 186 lb (84.4 kg)  Height: 5\' 9"  (1.753 m)    Assessment & Plan:  Shingrix IM given at visit

## 2019-01-01 NOTE — Assessment & Plan Note (Signed)
Stable, seeing ID for CD4 counts and meds. Overall with good control.

## 2019-01-01 NOTE — Assessment & Plan Note (Signed)
Time spent counseling about tobacco usage: 4 minutes. I have asked about smoking and is smoking same as usual. The patient is advised to quit. The patient is willing to quit. They would like to try to quit in the next 6-12 months. We will follow up with them in 6-12 months.

## 2019-01-30 ENCOUNTER — Telehealth: Payer: Self-pay

## 2019-01-30 NOTE — Telephone Encounter (Signed)
States partner (patient at [facility]) was tested/treated for chlamydia . Patient would also like to be treated due to known contact. Routing to provider for advise.  Eugenia Mcalpine

## 2019-01-31 ENCOUNTER — Ambulatory Visit: Payer: Managed Care, Other (non HMO)

## 2019-01-31 ENCOUNTER — Other Ambulatory Visit: Payer: Self-pay

## 2019-01-31 MED ORDER — DOXYCYCLINE HYCLATE 100 MG PO TABS: 100.0000 mg | ORAL_TABLET | Freq: Two times a day (BID) | ORAL | 0 refills | Status: AC

## 2019-01-31 NOTE — Telephone Encounter (Signed)
Can have doxycycline 100 mg bid x 7 days

## 2019-01-31 NOTE — Telephone Encounter (Signed)
Called patient and made aware of Dr. Tommy Medal advise to take doxycycline 100 BID by mouth for 7 days, verified pharmacy. Patient appreciative of advise.  [name]

## 2019-01-31 NOTE — Telephone Encounter (Signed)
Perfect

## 2019-02-21 ENCOUNTER — Encounter (INDEPENDENT_AMBULATORY_CARE_PROVIDER_SITE_OTHER): Payer: Self-pay

## 2019-02-21 ENCOUNTER — Other Ambulatory Visit: Payer: Self-pay

## 2019-02-21 VITALS — BP 110/76 | HR 71 | Temp 98.5°F | Wt 179.0 lb

## 2019-02-21 DIAGNOSIS — Z006 Encounter for examination for normal comparison and control in clinical research program: Secondary | ICD-10-CM

## 2019-02-21 NOTE — Research (Signed)
Participant here for [phone] study. He has no new complaints or concerns. He has been doing well with his study medication compliance. He received another 90 day supply. He is scheduled for return in January for clinic labs and his research visit.

## 2019-04-03 ENCOUNTER — Other Ambulatory Visit: Payer: Self-pay

## 2019-04-03 ENCOUNTER — Ambulatory Visit (INDEPENDENT_AMBULATORY_CARE_PROVIDER_SITE_OTHER): Payer: Managed Care, Other (non HMO)

## 2019-04-03 DIAGNOSIS — Z299 Encounter for prophylactic measures, unspecified: Secondary | ICD-10-CM

## 2019-04-03 DIAGNOSIS — Z23 Encounter for immunization: Secondary | ICD-10-CM | POA: Diagnosis not present

## 2019-04-06 ENCOUNTER — Other Ambulatory Visit: Payer: Self-pay | Admitting: Infectious Disease

## 2019-04-06 DIAGNOSIS — B20 Human immunodeficiency virus [HIV] disease: Secondary | ICD-10-CM

## 2019-05-19 ENCOUNTER — Other Ambulatory Visit: Payer: Self-pay | Admitting: Infectious Disease

## 2019-05-21 MED ORDER — TESTOSTERONE CYPIONATE 200 MG/ML IM SOLN: INTRAMUSCULAR | 4 refills | Status: DC

## 2019-05-21 MED ORDER — TESTOSTERONE CYPIONATE 200 MG/ML IM SOLN: INTRAMUSCULAR | 0 refills | Status: DC

## 2019-05-21 NOTE — Addendum Note (Signed)
Addended by: Eugenia Mcalpine on: 05/21/2019 02:46 PM   Modules accepted: Orders

## 2019-05-21 NOTE — Addendum Note (Signed)
Addended by: Eugenia Mcalpine on: 05/21/2019 02:56 PM   Modules accepted: Orders

## 2019-05-21 NOTE — Telephone Encounter (Signed)
Rx called in to pharmacy. 

## 2019-06-19 ENCOUNTER — Other Ambulatory Visit: Payer: Managed Care, Other (non HMO)

## 2019-06-19 ENCOUNTER — Encounter (INDEPENDENT_AMBULATORY_CARE_PROVIDER_SITE_OTHER): Payer: Managed Care, Other (non HMO) | Admitting: *Deleted

## 2019-06-19 ENCOUNTER — Other Ambulatory Visit: Payer: Self-pay

## 2019-06-19 VITALS — BP 123/82 | HR 63 | Temp 98.5°F | Wt 191.1 lb

## 2019-06-19 DIAGNOSIS — E291 Testicular hypofunction: Secondary | ICD-10-CM

## 2019-06-19 DIAGNOSIS — Z006 Encounter for examination for normal comparison and control in clinical research program: Secondary | ICD-10-CM

## 2019-06-19 DIAGNOSIS — B20 Human immunodeficiency virus [HIV] disease: Secondary | ICD-10-CM

## 2019-06-19 DIAGNOSIS — R7989 Other specified abnormal findings of blood chemistry: Secondary | ICD-10-CM

## 2019-06-19 DIAGNOSIS — F17200 Nicotine dependence, unspecified, uncomplicated: Secondary | ICD-10-CM

## 2019-06-19 DIAGNOSIS — D751 Secondary polycythemia: Secondary | ICD-10-CM

## 2019-06-19 NOTE — Research (Signed)
[name] was here for his month 32 visit for Reprieve, A Randomized Trial to Prevent Vascular Events in HIV (study drug is Pitavastatin 4mg  or placebo).  He denies any new problems or concerns. Has been working from home and staying safe from [employer]. He had his flu shot back in October at [facility]. He is to see Dr. Tommy Medal in 2 weeks and back for research in May.

## 2019-06-19 NOTE — Addendum Note (Signed)
Addended by: Dolan Amen D on: 06/19/2019 09:03 AM   Modules accepted: Orders

## 2019-06-20 LAB — T-HELPER CELL (CD4) - (RCID CLINIC ONLY)
CD4 % Helper T Cell: 32 % — ABNORMAL LOW (ref 33–65)
CD4 T Cell Abs: 471 /uL (ref 400–1790)

## 2019-06-22 LAB — COMPLETE METABOLIC PANEL WITH GFR
AG Ratio: 2 (calc) (ref 1.0–2.5)
ALT: 35 U/L (ref 9–46)
AST: 21 U/L (ref 10–35)
Albumin: 4.7 g/dL (ref 3.6–5.1)
Alkaline phosphatase (APISO): 72 U/L (ref 35–144)
BUN: 19 mg/dL (ref 7–25)
CO2: 28 mmol/L (ref 20–32)
Calcium: 9.7 mg/dL (ref 8.6–10.3)
Chloride: 104 mmol/L (ref 98–110)
Creat: 1.07 mg/dL (ref 0.70–1.33)
GFR, Est African American: 92 mL/min/{1.73_m2} (ref >=60)
GFR, Est Non African American: 79 mL/min/{1.73_m2} (ref >=60)
Globulin: 2.4 g/dL (calc) (ref 1.9–3.7)
Glucose, Bld: 115 mg/dL — ABNORMAL HIGH (ref 65–99)
Potassium: 4.2 mmol/L (ref 3.5–5.3)
Sodium: 140 mmol/L (ref 135–146)
Total Bilirubin: 0.7 mg/dL (ref 0.2–1.2)
Total Protein: 7.1 g/dL (ref 6.1–8.1)

## 2019-06-22 LAB — CBC WITH DIFFERENTIAL/PLATELET
Absolute Monocytes: 577 cells/uL (ref 200–950)
Basophils Absolute: 37 cells/uL (ref 0–200)
Basophils Relative: 0.4 %
Eosinophils Absolute: 102 cells/uL (ref 15–500)
Eosinophils Relative: 1.1 %
HCT: 52.9 % — ABNORMAL HIGH (ref 38.5–50.0)
Hemoglobin: 18.6 g/dL — ABNORMAL HIGH (ref 13.2–17.1)
Lymphs Abs: 1590 cells/uL (ref 850–3900)
MCH: 34.6 pg — ABNORMAL HIGH (ref 27.0–33.0)
MCHC: 35.2 g/dL (ref 32.0–36.0)
MCV: 98.5 fL (ref 80.0–100.0)
MPV: 11.1 fL (ref 7.5–12.5)
Monocytes Relative: 6.2 %
Neutro Abs: 6994 cells/uL (ref 1500–7800)
Neutrophils Relative %: 75.2 %
Platelets: 199 10*3/uL (ref 140–400)
RBC: 5.37 10*6/uL (ref 4.20–5.80)
RDW: 13.3 % (ref 11.0–15.0)
Total Lymphocyte: 17.1 %
WBC: 9.3 10*3/uL (ref 3.8–10.8)

## 2019-06-22 LAB — LIPID PANEL
Cholesterol: 156 mg/dL (ref <200)
HDL: 39 mg/dL — ABNORMAL LOW (ref >=40)
LDL Cholesterol (Calc): 99 mg/dL (calc)
Non-HDL Cholesterol (Calc): 117 mg/dL (calc) (ref <130)
Total CHOL/HDL Ratio: 4 (calc) (ref <5.0)
Triglycerides: 87 mg/dL (ref <150)

## 2019-06-22 LAB — FLUORESCENT TREPONEMAL AB(FTA)-IGG-BLD: Fluorescent Treponemal ABS: REACTIVE — AB

## 2019-06-22 LAB — HIV-1 RNA QUANT-NO REFLEX-BLD
HIV 1 RNA Quant: <20 copies/mL — AB
HIV-1 RNA Quant, Log: <1.3 Log copies/mL — AB

## 2019-06-22 LAB — RPR TITER: RPR Titer: 1:1 {titer} — ABNORMAL HIGH

## 2019-06-22 LAB — PSA: PSA: 1.2 ng/mL (ref <=4.0)

## 2019-06-22 LAB — TESTOSTERONE: Testosterone: 114 ng/dL — ABNORMAL LOW (ref 250–827)

## 2019-06-22 LAB — RPR: RPR Ser Ql: REACTIVE — AB

## 2019-06-27 ENCOUNTER — Telehealth: Payer: Self-pay | Admitting: *Deleted

## 2019-06-27 NOTE — Telephone Encounter (Signed)
Relayed to patient. He verbalized understanding to stop testosterone completely.  Saturday 1/16 was last dose of injected testosterone. He has been working on tobacco cessation.  Confirmed follow up with Dr Hubert Azure, Lanice Schwab, RN

## 2019-06-27 NOTE — Telephone Encounter (Signed)
-----   Message from Truman Hayward, MD sent at 06/23/2019  9:10 AM EST ----- Yes, I will ask my staff to have him stop the testosterone completely. Hopefully he can work on some other things like smoking cessation but this hsa been more of an issue since we started trying to replace it. I think he'll likely appreciate recheck and other consultants too ----- Message ----- From: Hoyt Koch, MD Sent: 06/21/2019  10:29 AM EST To: Truman Hayward, MD  Definitely he should stop the testosterone with those blood counts because risk of heart attack/stroke go up. You can get him set up to see me back again in a month or so for retesting levels and if needed we can get him in with endo to see if there are other options.

## 2019-07-02 ENCOUNTER — Telehealth: Payer: Self-pay

## 2019-07-02 NOTE — Telephone Encounter (Signed)
COVID-19 Pre-Screening Questions:07/02/2019  Do you currently have a fever (>100 F), chills or unexplained body aches? NO  Are you currently experiencing new cough, shortness of breath, sore throat, runny nose?NO .  Have you recently travelled outside the state of [place] in the last 14 days? NO .  Have you been in contact with someone that is currently pending confirmation of Covid19 testing or has been confirmed to have the [place] virus? NO  **If the patient answers NO to ALL questions -  advise the patient to please call the clinic before coming to the office should any symptoms develop.

## 2019-07-03 ENCOUNTER — Other Ambulatory Visit: Payer: Self-pay

## 2019-07-03 ENCOUNTER — Ambulatory Visit (INDEPENDENT_AMBULATORY_CARE_PROVIDER_SITE_OTHER): Payer: Managed Care, Other (non HMO) | Admitting: Infectious Disease

## 2019-07-03 ENCOUNTER — Encounter: Payer: Self-pay | Admitting: Infectious Disease

## 2019-07-03 VITALS — BP 123/80 | HR 73 | Temp 97.7°F | Ht 69.0 in | Wt 190.0 lb

## 2019-07-03 DIAGNOSIS — D751 Secondary polycythemia: Secondary | ICD-10-CM

## 2019-07-03 DIAGNOSIS — B20 Human immunodeficiency virus [HIV] disease: Secondary | ICD-10-CM

## 2019-07-03 DIAGNOSIS — F17200 Nicotine dependence, unspecified, uncomplicated: Secondary | ICD-10-CM | POA: Diagnosis not present

## 2019-07-03 DIAGNOSIS — E291 Testicular hypofunction: Secondary | ICD-10-CM | POA: Diagnosis not present

## 2019-07-03 DIAGNOSIS — C8580 Other specified types of non-Hodgkin lymphoma, unspecified site: Secondary | ICD-10-CM

## 2019-07-03 MED ORDER — CHANTIX STARTING MONTH PAK 0.5 MG X 11 & 1 MG X 42 PO TABS: ORAL_TABLET | ORAL | 0 refills | Status: DC

## 2019-07-03 MED ORDER — BIKTARVY 50-200-25 MG PO TABS: 1.0000 | ORAL_TABLET | Freq: Every day | ORAL | 11 refills | Status: AC

## 2019-07-03 MED ORDER — VARENICLINE TARTRATE 1 MG PO TABS: 1.0000 mg | ORAL_TABLET | Freq: Two times a day (BID) | ORAL | 1 refills | Status: DC

## 2019-07-03 NOTE — Progress Notes (Signed)
Subjective:   Chief complaint: Follow-up for HIV polycythemia low testosterone smoking   Patient ID: [name], male    DOB: 12-14-1965, 54 y.o.   MRN: [name]  HPI  [name] is a 54 year old Caucasian man living with HIV, originally with AIDS and non-Hodgkin's lymphoma status post chemotherapy and also treatment for squamous cell carcinoma who is been highly adherent to his antiretrovirals and perfectly controlled on Biktarvy.  Note at diagnosis he carried a K103N mutation, but no significant known NNRTI mutations  Low testosterone to me the plan to replace it with testosterone injection.  Unfortunately over the years he has developed worsening polycythemia this is also occurred in the context of ongoing smoking.  While his testosterone remains lower with reduced dosage of testosterone his polycythemia persists and he still also is smoking.  We asked him to stop smoking altogether and he is going to follow-up with his primary care physician Dr. Sharlet Rodriguez as well.  He is very much interested in stopping smoking as well and I have called in Chantix for him today as well.   Past Medical History:  Diagnosis Date  . ALLERGIC RHINITIS   . Anal fissure    Chronic, recurrent anal bleeding with pain  . Depression   . Grieving 12/09/2015  . HIV INFECTION 07/05/2010 dx  . 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 Low testosterone 05/07/2018  . NEUTROPENIA, DRUG-INDUCED    on chemo early 2012  . 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 Problems in relationship with spouse or partner 12/09/2015  . 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 Smoker 05/07/2018  . 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 Thrombocytopenia ([place])    resolved  . Toe fracture, right 10/04/2012   great toe,  nonsurgical mgmt [name])    Past Surgical History:  Procedure Laterality Date  . ANAL INTRAEPITHELIAL NEOPLASIA EXCISION  06/2011   wide exiscion of Sq cell ca  . LYMPH NODE BIOPSY  1975   L ingunial - nondx  . [place] fracture surgery  2010   L elbow  . NASAL SEPTUM SURGERY  2006  . Precancer lesion removed from anus  2012    Family History  Problem Relation Age of Onset  . Cancer Father 18       unknown primary  . Alcohol abuse Father   . Cirrhosis Father        alcohol  . Alcohol abuse Mother   . Hypertension Mother   . Hyperlipidemia Brother   . Macular degeneration Brother        histoplasm  . Hypertension Sister 72       carotid blockage      Social History   Socioeconomic History  . Marital status: Married    Spouse name: Not on file  . Number of children: Not on file  . Years of education: Not on file  . Highest education level: Not on file  Occupational History  . Not on file  Tobacco Use  . Smoking status: Current Some Day Smoker    Packs/day: 1.00    Types: Cigarettes    Last attempt to quit: 07/22/2015    Years since quitting: 3.9  . Smokeless tobacco: Never Used  Substance and Sexual Activity  . Alcohol use: Yes  Alcohol/week: 0.0 standard drinks    Comment: occasional, socially  . Drug use: No  . Sexual activity: Yes    Comment: delined condoms  Other Topics Concern  . 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 Difficulty of Paying Living Expenses: Not on file  Food Insecurity:   . Worried About Charity fundraiser in the Last Year: Not on file  . Ran Out of Food in the Last Year: Not on file  Transportation Needs:   . Lack of Transportation (Medical): Not on file  . Lack of Transportation (Non-Medical): Not on file  Physical Activity:   . Days of Exercise per Week: Not on file  . Minutes of Exercise per  Session: Not on file  Stress:   . Feeling of Stress : Not on file  Social Connections:   . Frequency of Communication with Friends and Family: Not on file  . Frequency of Social Gatherings with Friends and Family: Not on file  . Attends Religious Services: Not on file  . Active Member of Clubs or Organizations: Not on file  . Attends Archivist Meetings: Not on file  . Marital Status: Not on file    Allergies  Allergen Reactions  . Sulfonamide Derivatives     REACTION: rash, swelling, trouble breathing     Current Outpatient Medications:  .  ALPRAZolam (XANAX) 0.25 MG tablet, Take 1 tablet (0.25 mg total) by mouth 2 (two) times daily as needed for anxiety., Disp: 180 tablet, Rfl: 1 .  bictegravir-emtricitabine-tenofovir AF (BIKTARVY) 50-200-25 MG TABS tablet, Take 1 tablet by mouth daily., Disp: 60 tablet, Rfl: 11 .  bictegravir-emtricitabine-tenofovir AF (BIKTARVY) 50-200-25 MG TABS tablet, Take 1 tablet by mouth daily., Disp: 30 tablet, Rfl: 11 .  Pitavastatin Calcium 4 MG TABS, Take 1 tablet (4 mg total) by mouth daily., Disp: 30 tablet, Rfl: 11 .  sildenafil (VIAGRA) 100 MG tablet, Take 0.5-1 tablets (50-100 mg total) by mouth daily as needed for erectile dysfunction. (Patient not taking: Reported on 07/03/2019), Disp: 90 tablet, Rfl: 3 .  testosterone cypionate (DEPOTESTOSTERONE CYPIONATE) 200 MG/ML injection, INJECT 0.75ML IN THE MUSCLE ONCE WEEKLY. DISCARD VIAL AFTER 1ST USE (Patient not taking: Reported on 07/03/2019), Disp: 4 mL, Rfl: 4 .  varenicline (CHANTIX CONTINUING MONTH PAK) 1 MG tablet, Take 1 tablet (1 mg total) by mouth 2 (two) times daily., Disp: 60 tablet, Rfl: 1 .  varenicline (CHANTIX STARTING MONTH PAK) 0.5 MG X 11 & 1 MG X 42 tablet, 0.5 mg tab qday x 3d, then 0.5 mg tabl bid,x  4 days, then1 mg bid, Disp: 53 tablet, Rfl: 0    Review of Systems  Constitutional: Negative for chills and fever.  HENT: Negative for congestion and sore throat.   Eyes:  Negative for photophobia.  Respiratory: Negative for cough, shortness of breath and wheezing.   Cardiovascular: Negative for chest pain, palpitations and leg swelling.  Gastrointestinal: Negative for abdominal pain, blood in stool, constipation, diarrhea, nausea and vomiting.  Genitourinary: Negative for dysuria, flank pain and hematuria.  Musculoskeletal: Negative for back pain and myalgias.  Skin: Negative for rash.  Neurological: Negative for dizziness, weakness and headaches.  Hematological: Does not bruise/bleed easily.  Psychiatric/Behavioral: Negative for agitation, behavioral problems, confusion, decreased concentration, dysphoric mood, hallucinations and suicidal ideas. The patient is not hyperactive.  Objective:   Physical Exam Constitutional:      General: He is not in acute distress.    Appearance: Normal appearance. He is well-developed. He is not ill-appearing or diaphoretic.  HENT:     Head: Normocephalic and atraumatic.     Right Ear: Hearing and external ear normal.     Left Ear: Hearing and external ear normal.     Nose: No nasal deformity or rhinorrhea.  Eyes:     General: No scleral icterus.    Conjunctiva/sclera: Conjunctivae normal.     Right eye: Right conjunctiva is not injected.     Left eye: Left conjunctiva is not injected.     Pupils: Pupils are equal, round, and reactive to light.  Neck:     Vascular: No JVD.  Cardiovascular:     Rate and Rhythm: Normal rate and regular rhythm.     Heart sounds: Normal heart sounds, S1 normal and S2 normal. No murmur. No friction rub.  Abdominal:     General: Bowel sounds are normal. There is no distension.     Palpations: Abdomen is soft.     Tenderness: There is no abdominal tenderness.  Musculoskeletal:        General: Normal range of motion.     Right shoulder: Normal.     Left shoulder: Normal.     Cervical back: Normal range of motion and neck supple.     Right hip: Normal.     Left hip: Normal.      Right knee: Normal.     Left knee: Normal.  Lymphadenopathy:     Head:     Right side of head: No submandibular, preauricular or posterior auricular adenopathy.     Left side of head: No submandibular, preauricular or posterior auricular adenopathy.     Cervical: No cervical adenopathy.     Right cervical: No superficial or deep cervical adenopathy.    Left cervical: No superficial or deep cervical adenopathy.  Skin:    General: Skin is warm and dry.     Coloration: Skin is not pale.     Findings: No abrasion, bruising, ecchymosis, erythema, lesion or rash.     Nails: There is no clubbing.  Neurological:     Mental Status: He is alert and oriented to person, place, and time.     Sensory: No sensory deficit.     Coordination: Coordination normal.     Gait: Gait normal.  Psychiatric:        Attention and Perception: He is attentive.        Mood and Affect: Mood normal.        Speech: Speech normal.        Behavior: Behavior normal. Behavior is cooperative.        Thought Content: Thought content normal.        Judgment: Judgment normal.           Assessment & Plan:

## 2019-07-04 ENCOUNTER — Telehealth: Payer: Self-pay

## 2019-07-04 NOTE — Telephone Encounter (Signed)
Insurance request PA for Chantix. PA initiated via cover my meds:  [id number]  Status Pending. [name]

## 2019-07-12 NOTE — Telephone Encounter (Signed)
Chantix was denied. Alternative patient preference is Wellbutrin. Routing to MD for orders. Eugenia Mcalpine

## 2019-07-15 ENCOUNTER — Other Ambulatory Visit: Payer: Self-pay | Admitting: Infectious Disease

## 2019-07-15 MED ORDER — BUPROPION HCL ER (XL) 150 MG PO TB24: 150.0000 mg | ORAL_TABLET | Freq: Every day | ORAL | 4 refills | Status: DC

## 2019-07-20 ENCOUNTER — Ambulatory Visit: Payer: Managed Care, Other (non HMO)

## 2019-08-01 ENCOUNTER — Other Ambulatory Visit: Payer: Self-pay | Admitting: Infectious Disease

## 2019-08-01 MED ORDER — BUPROPION HCL ER (XL) 300 MG PO TB24: 300.0000 mg | ORAL_TABLET | Freq: Every day | ORAL | 11 refills | Status: AC

## 2019-08-02 ENCOUNTER — Other Ambulatory Visit: Payer: Self-pay | Admitting: Internal Medicine

## 2019-08-02 NOTE — Telephone Encounter (Signed)
Filled 04/08/2019 Alprazolam 0.25 Mg Tablet 180#  Last ov 01/01/19 Next ov n/s

## 2019-08-08 ENCOUNTER — Ambulatory Visit: Payer: Managed Care, Other (non HMO) | Admitting: Internal Medicine

## 2019-08-08 ENCOUNTER — Encounter: Payer: Self-pay | Admitting: Internal Medicine

## 2019-08-08 ENCOUNTER — Other Ambulatory Visit: Payer: Self-pay

## 2019-08-08 DIAGNOSIS — M545 Low back pain, unspecified: Secondary | ICD-10-CM

## 2019-08-08 NOTE — Progress Notes (Signed)
   Subjective:   Patient ID: [name], male    DOB: 04-13-1966, 54 y.o.   MRN: [id number]  HPI The patient is a 54 YO man coming in for concerns about back pain. Started on Saturday and overall is improving. Has taken otc medication tylenol for this. Denies injury or strain. Pain mid back and does not radiate anywhere. Denies fevers or chills. Denies loss of bowels or bladder. Denies numbness in legs or arms.   Review of Systems  Constitutional: Negative.   HENT: Negative.   Eyes: Negative.   Respiratory: Negative for cough, chest tightness and shortness of breath.   Cardiovascular: Negative for chest pain, palpitations and leg swelling.  Gastrointestinal: Negative for abdominal distention, abdominal pain, constipation, diarrhea, nausea and vomiting.  Musculoskeletal: Positive for back pain.  Skin: Negative.   Neurological: Negative.   Psychiatric/Behavioral: Negative.     Objective:  Physical Exam Constitutional:      Appearance: He is well-developed.  HENT:     Head: Normocephalic and atraumatic.  Cardiovascular:     Rate and Rhythm: Normal rate and regular rhythm.  Pulmonary:     Effort: Pulmonary effort is normal. No respiratory distress.     Breath sounds: Normal breath sounds. No wheezing or rales.  Abdominal:     General: Bowel sounds are normal. There is no distension.     Palpations: Abdomen is soft.     Tenderness: There is no abdominal tenderness. There is no rebound.  Musculoskeletal:        General: Tenderness present.     Cervical back: Normal range of motion.     Comments: Minimal tenderness upper lumbar paraspinal  Skin:    General: Skin is warm and dry.  Neurological:     Mental Status: He is alert and oriented to person, place, and time.     Coordination: Coordination normal.     Vitals:   08/08/19 1315  BP: 124/74  Pulse: 82  Temp: 98.2 F (36.8 C)  TempSrc: Oral  SpO2: 96%  Weight: 190 lb 9.6 oz (86.5 kg)  Height: 5\' 9"  (1.753 m)     This visit occurred during the SARS-CoV-2 public health emergency.  Safety protocols were in place, including screening questions prior to the visit, additional usage of staff PPE, and extensive cleaning of exam room while observing appropriate contact time as indicated for disinfecting solutions.   Assessment & Plan:

## 2019-08-08 NOTE — Patient Instructions (Signed)

## 2019-08-09 DIAGNOSIS — M549 Dorsalgia, unspecified: Secondary | ICD-10-CM | POA: Insufficient documentation

## 2019-08-09 NOTE — Assessment & Plan Note (Addendum)
Overall improving, will continue tylenol. Call with worsening or lack of resolution. No indication for imaging.

## 2019-10-07 ENCOUNTER — Other Ambulatory Visit: Payer: Self-pay

## 2019-10-07 ENCOUNTER — Ambulatory Visit (HOSPITAL_COMMUNITY)
Admission: EM | Admit: 2019-10-07 | Discharge: 2019-10-07 | Disposition: A | Discharge status: Home / Self Care | Payer: Managed Care, Other (non HMO) | Attending: Physician Assistant | Admitting: Physician Assistant

## 2019-10-07 ENCOUNTER — Encounter (HOSPITAL_COMMUNITY): Payer: Self-pay

## 2019-10-07 DIAGNOSIS — L03116 Cellulitis of left lower limb: Secondary | ICD-10-CM | POA: Diagnosis not present

## 2019-10-07 DIAGNOSIS — L02416 Cutaneous abscess of left lower limb: Secondary | ICD-10-CM | POA: Diagnosis not present

## 2019-10-07 MED ORDER — CEPHALEXIN 500 MG PO CAPS: 500.0000 mg | ORAL_CAPSULE | Freq: Four times a day (QID) | ORAL | 0 refills | Status: AC

## 2019-10-07 MED ORDER — LIDOCAINE-EPINEPHRINE (PF) 2 %-1:200000 IJ SOLN
INTRAMUSCULAR | Status: AC
  Filled 2019-10-07: qty 20

## 2019-10-07 MED ORDER — DOXYCYCLINE HYCLATE 100 MG PO CAPS: 100.0000 mg | ORAL_CAPSULE | Freq: Two times a day (BID) | ORAL | 0 refills | Status: AC

## 2019-10-07 NOTE — ED Triage Notes (Signed)
Pt states he thinks something bit him in his left leg and states The bump has gotten worse each day. Pt has a circular area that is very erythematous w/ scant bloody drainage on left upper leg.

## 2019-10-07 NOTE — ED Provider Notes (Signed)
[facility]    CSN: [id number] Arrival date & time: 10/07/19  [id number]      History   Chief Complaint Chief Complaint  Patient presents with  . leg infection    HPI [name] is a 54 y.o. male.   Patient presents for concern of infection on left leg.  Patient notes that 4 days ago he noticed a small bump on his left inner thigh.  States has had a small head to it.  He believes is popped however over the next few days he noticed redness spreading from around it.  Eventually there is a central area that began draining a small amount of blood.  Reports the area is quite painful.  Denies any fever or chills.  Denies history of abscess or skin infection in this area.         Past Medical History:  Diagnosis Date  . ALLERGIC RHINITIS   . Anal fissure    Chronic, recurrent anal bleeding with pain  . Depression   . Grieving 12/09/2015  . HIV INFECTION 07/05/2010 dx  . 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 Low testosterone 05/07/2018  . NEUTROPENIA, DRUG-INDUCED    on chemo early 2012  . 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 Problems in relationship with spouse or partner 12/09/2015  . 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 Smoker 05/07/2018  . 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 Thrombocytopenia ([place])    resolved  . Toe fracture, right 10/04/2012   great toe, nonsurgical mgmt Sharol Given)    Patient Active Problem List   Diagnosis Date Noted  . Back pain 08/09/2019  . Low testosterone 05/07/2018  . 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 Smoker 05/07/2018  . Routine general medical examination at a health care facility 12/24/2015  . 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 Problems in relationship with spouse or partner 12/09/2015  . Hypogonadism in  male 08/27/2014  . NHL (non-Hodgkin's lymphoma) ([place]) 05/26/2014  . Anxiety 06/08/2012  . Molluscum contagiosum 02/02/2011  . Human immunodeficiency virus (HIV) disease ([place]) 07/05/2010    Past Surgical History:  Procedure Laterality Date  . ANAL INTRAEPITHELIAL NEOPLASIA EXCISION  06/2011   wide exiscion of Sq cell ca  . LYMPH NODE BIOPSY  1975   L ingunial - nondx  . Monteggia fracture surgery  2010   L elbow  . NASAL SEPTUM SURGERY  2006  . Precancer lesion removed from anus  2012       Home Medications    Prior to Admission medications   Medication Sig Start Date End Date Taking? Authorizing Provider  ALPRAZolam Duanne Moron) 0.25 MG tablet TAKE 1 TABLET BY MOUTH  TWICE DAILY AS NEEDED FOR  ANXIETY 08/05/19   Hoyt Koch, MD  bictegravir-emtricitabine-tenofovir AF (BIKTARVY) 50-200-25 MG TABS tablet Take 1 tablet by mouth daily. 07/03/19   Truman Hayward, MD  bictegravir-emtricitabine-tenofovir AF (BIKTARVY) 50-200-25 MG TABS tablet Take 1 tablet by mouth daily. 07/03/19   Truman Hayward, MD  buPROPion (WELLBUTRIN XL) 300 MG 24 hr tablet Take 1 tablet (300 mg total) by mouth daily. 08/01/19   Truman Hayward, MD  cephALEXin (KEFLEX) 500 MG capsule Take 1 capsule (500 mg total) by mouth 4 (four) times daily  for 5 days. 10/07/19 10/12/19  Dalayza Zambrana, Marguerita Beards, PA-C  doxycycline (VIBRAMYCIN) 100 MG capsule Take 1 capsule (100 mg total) by mouth 2 (two) times daily for 10 days. 10/07/19 10/17/19  Kemya Shed, Marguerita Beards, PA-C  Pitavastatin Calcium 4 MG TABS Take 1 tablet (4 mg total) by mouth daily. 07/18/14   Truman Hayward, MD  sildenafil (VIAGRA) 100 MG tablet Take 0.5-1 tablets (50-100 mg total) by mouth daily as needed for erectile dysfunction. 06/12/18   Hoyt Koch, MD    Family History Family History  Problem Relation Age of Onset  . Cancer Father 2       unknown primary  . Alcohol abuse Father   . Cirrhosis Father        alcohol  . Alcohol abuse Mother   .  Hypertension Mother   . Hyperlipidemia Brother   . Macular degeneration Brother        histoplasm  . Hypertension Sister 89       carotid blockage    Social History Social History   Tobacco Use  . Smoking status: Current Some Day Smoker    Packs/day: 1.00    Types: Cigarettes    Last attempt to quit: 07/22/2015    Years since quitting: 4.2  . Smokeless tobacco: Never Used  Substance Use Topics  . Alcohol use: Yes    Alcohol/week: 0.0 standard drinks    Comment: occasional, socially  . Drug use: No     Allergies   Sulfonamide derivatives   Review of Systems Review of Systems Per HPI  Physical Exam Triage Vital Signs ED Triage Vitals  Enc Vitals Group     BP 10/07/19 0955 115/82     Pulse Rate 10/07/19 0952 67     Resp 10/07/19 0952 16     Temp 10/07/19 0952 98.2 F (36.8 C)     Temp Source 10/07/19 0952 Oral     SpO2 10/07/19 0952 99 %     Weight 10/07/19 0956 190 lb (86.2 kg)     Height 10/07/19 0956 5\' 9"  (1.753 m)     Head Circumference --      Peak Flow --      Pain Score 10/07/19 0955 5     Pain Loc --      Pain Edu? --      Excl. in [place]? --    No data found.  Updated Vital Signs BP 115/82   Pulse 67   Temp 98.2 F (36.8 C) (Oral)   Resp 16   Ht 5\' 9"  (1.753 m)   Wt 190 lb (86.2 kg)   SpO2 99%   BMI 28.06 kg/m   Visual Acuity Right Eye Distance:   Left Eye Distance:   Bilateral Distance:    Right Eye Near:   Left Eye Near:    Bilateral Near:     Physical Exam Vitals and nursing note reviewed.  Constitutional:      General: He is not in acute distress.    Appearance: Normal appearance. He is not ill-appearing.  Skin:    General: Skin is warm and dry.     Comments: Left upper inner thigh with 7 cm diameter area of erythema.  There is a central roughly 1.5 to 2 cm area of induration with a central area of drainage.  Drainage is blood-tinged purulence.  Entire area is tender to palpation.  Neurological:     Mental Status: He is  alert.  UC Treatments / Results  Labs (all labs ordered are listed, but only abnormal results are displayed) Labs Reviewed - No data to display  EKG   Radiology No results found.  Procedures Incision and Drainage  Date/Time: 10/07/2019 11:34 PM Performed by: Purnell Shoemaker, PA-C Authorized by: Purnell Shoemaker, PA-C   Consent:    Consent obtained:  Verbal   Consent given by:  Patient   Risks discussed:  Bleeding, incomplete drainage and pain   Alternatives discussed:  Alternative treatment Location:    Type:  Abscess   Location:  Lower extremity   Lower extremity location:  Leg   Leg location:  L upper leg Pre-procedure details:    Skin preparation:  Antiseptic wash and Betadine Anesthesia (see MAR for exact dosages):    Anesthesia method:  Local infiltration   Local anesthetic:  Lidocaine 2% WITH epi Procedure type:    Complexity:  Simple Procedure details:    Needle aspiration: no     Incision types:  Single straight   Scalpel blade:  11   Wound management:  Probed and deloculated and irrigated with saline   Drainage:  Purulent and bloody   Drainage amount:  Moderate   Wound treatment:  Wound left open Post-procedure details:    Patient tolerance of procedure:  Tolerated well, no immediate complications   (including critical care time)  Medications Ordered in UC Medications - No data to display  Initial Impression / Assessment and Plan / UC Course  I have reviewed the triage vital signs and the nursing notes.  Pertinent labs & imaging results that were available during my care of the patient were reviewed by me and considered in my medical decision making (see chart for details).     #Cellulitis and abscess of left leg. Patient Is a 54 year old past medical history of HIV with abscess with surrounding cellulitis of the left leg.  Given significant cellulitis surrounding minimal pocket of purulent discharge do believe will best cover with Keflex and  doxycycline.  Doxycycline will cover MRSA Keflex to cover for streptococcal infection.  We discussed wound care and precautions over the next 2 to 3 days.  We discussed follow-up precautions such that if he felt there was no improvement, reaccumulation or concerns he should return in 2 to 3 days for wound check.  Discussed that if he feels things are healing very well and there are no complaints he can continue to monitor.  Patient verbalized understanding and agreement with this plan.   Final Clinical Impressions(s) / UC Diagnoses   Final diagnoses:  Cellulitis and abscess of left leg     Discharge Instructions     Take the keflex for 5 days, 4 times a day Take the doxycylcine 2 times a day for 10 days  If redness continues to spread, there is large amounts of drainage, a pocket reforms or generally not improving over the next 2-3 days, return. If it appears very well you can continue to monitor at home  Clean the area daily. Apply gentle pressure around the wound to encourage drainage, some drainage is desired for 1-2 days.       ED Prescriptions    Medication Sig Dispense Auth. Provider   doxycycline (VIBRAMYCIN) 100 MG capsule Take 1 capsule (100 mg total) by mouth 2 (two) times daily for 10 days. 20 capsule Leya Paige, Marguerita Beards, PA-C   cephALEXin (KEFLEX) 500 MG capsule Take 1 capsule (500 mg total) by mouth 4 (four) times daily  for 5 days. 20 capsule Li Fragoso, Marguerita Beards, PA-C     PDMP not reviewed this encounter.   Purnell Shoemaker, PA-C 10/07/19 2338

## 2019-10-07 NOTE — Discharge Instructions (Signed)
Take the keflex for 5 days, 4 times a day Take the doxycylcine 2 times a day for 10 days  If redness continues to spread, there is large amounts of drainage, a pocket reforms or generally not improving over the next 2-3 days, return. If it appears very well you can continue to monitor at home  Clean the area daily. Apply gentle pressure around the wound to encourage drainage, some drainage is desired for 1-2 days.

## 2019-10-25 ENCOUNTER — Encounter (INDEPENDENT_AMBULATORY_CARE_PROVIDER_SITE_OTHER): Payer: Self-pay | Admitting: *Deleted

## 2019-10-25 ENCOUNTER — Encounter: Payer: Self-pay | Admitting: *Deleted

## 2019-10-25 ENCOUNTER — Other Ambulatory Visit: Payer: Self-pay

## 2019-10-25 VITALS — BP 129/89 | HR 60 | Temp 98.1°F | Wt 196.0 lb

## 2019-10-25 DIAGNOSIS — Z006 Encounter for examination for normal comparison and control in clinical research program: Secondary | ICD-10-CM

## 2019-10-25 NOTE — Research (Signed)
[name] was here for his month 39 visit for Reprieve, A Randomized Trial to Prevent Vascular Events in HIV (study drug is Pitavastatin 4mg  or placebo)  He was seen in Urgent care for an abcess/cellulitis on his left thighwhich has healed up now. He did get both of his covid vaccines. He denies any current problems. He is interested in the Hep B vaccine study and plans to screen on Tuesday. I gave him a copy of the consent to take home and read over. His next Reprieve visit will be in September. He is planning on moving to [facility] in August, so he will decide about moving his research visits to [facility] site after he moves.

## 2019-10-29 ENCOUNTER — Encounter (INDEPENDENT_AMBULATORY_CARE_PROVIDER_SITE_OTHER): Payer: Self-pay | Admitting: Infectious Disease

## 2019-10-29 ENCOUNTER — Other Ambulatory Visit: Payer: Self-pay

## 2019-10-29 VITALS — BP 145/94 | HR 61 | Temp 98.0°F | Ht 69.0 in | Wt 196.5 lb

## 2019-10-29 DIAGNOSIS — Z006 Encounter for examination for normal comparison and control in clinical research program: Secondary | ICD-10-CM

## 2019-10-29 DIAGNOSIS — B20 Human immunodeficiency virus [HIV] disease: Secondary | ICD-10-CM

## 2019-10-29 NOTE — Research (Signed)
[name] was here to screen for the BeeHive study on Hep B vaccines. Informed consent was obtained after he had a chance to read over the consent and we discussed it in detail. He had been vaccinated several time sin the past but on recheck he was nonreactive in the past. He does have a hx of NHL and anal cancer from years ago. Also had a broken left elbow that required surgery. He has developed some polycythemia, but has since quit smoking and stopped his testosterone, so hopefully his labs will be better this time. Entry is planned for June 17th if he is deemed eligible.

## 2019-10-29 NOTE — Progress Notes (Signed)
Subjective:   Chief complaint: here for Exam for ACTG BeeHIVE study   Patient ID: [name], male    DOB: 25-Jan-1966, 54 y.o.   MRN: [id number]  HPI  [name] is here for physical exam for entry into ACT G study BeeHIVE.  Tells me has successfully stop smoking which is fantastic.  Past Medical History:  Diagnosis Date  . ALLERGIC RHINITIS   . Anal fissure    Chronic, recurrent anal bleeding with pain  . Depression   . Grieving 12/09/2015  . HIV INFECTION 07/05/2010 dx  . 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 Low testosterone 05/07/2018  . NEUTROPENIA, DRUG-INDUCED    on chemo early 2012  . 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 Problems in relationship with spouse or partner 12/09/2015  . 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 Smoker 05/07/2018  . 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 Thrombocytopenia ([name])    resolved  . Toe fracture, right 10/04/2012   great toe, nonsurgical mgmt [name])    Past Surgical History:  Procedure Laterality Date  . ANAL INTRAEPITHELIAL NEOPLASIA EXCISION  06/2011   wide exiscion of Sq cell ca  . LYMPH NODE BIOPSY  1975   L ingunial - nondx  . Monteggia fracture surgery  2010   L elbow  . NASAL SEPTUM SURGERY  2006  . Precancer lesion removed from anus  2012    Family History  Problem Relation Age of Onset  . Cancer Father 45       unknown primary  . Alcohol abuse Father   . Cirrhosis Father        alcohol  . Alcohol abuse Mother   . Hypertension Mother   . Hyperlipidemia Brother   . Macular degeneration Brother        histoplasm  . Hypertension Sister 28       carotid blockage      Social History   Socioeconomic History  . Marital status: Married    Spouse name: Not on file  . Number of children: Not on file   . Years of education: Not on file  . Highest education level: Not on file  Occupational History  . Not on file  Tobacco Use  . Smoking status: Former Smoker    Packs/day: 1.00    Types: Cigarettes    Quit date: 10/12/2019    Years since quitting: 0.0  . Smokeless tobacco: Never Used  Substance and Sexual Activity  . Alcohol use: Yes    Alcohol/week: 0.0 standard drinks    Comment: occasional, socially  . Drug use: No  . Sexual activity: Yes    Comment: delined condoms  Other Topics Concern  . Not on file  Social History Narrative   Married - [name]   employed at Social worker   Social Determinants of Health   Financial Resource Strain:   . Difficulty of Paying Living Expenses:   Food Insecurity:   . Worried About Charity fundraiser in the Last Year:   . Arboriculturist in the Last Year:   Transportation Needs:   . Film/video editor (Medical):   Marland Kitchen Lack of Transportation (Non-Medical):   Physical Activity:   . Days of Exercise per Week:   .  Minutes of Exercise per Session:   Stress:   . Feeling of Stress :   Social Connections:   . Frequency of Communication with Friends and Family:   . Frequency of Social Gatherings with Friends and Family:   . Attends Religious Services:   . Active Member of Clubs or Organizations:   . Attends Archivist Meetings:   Marland Kitchen Marital Status:     Allergies  Allergen Reactions  . Sulfonamide Derivatives     REACTION: rash, swelling, trouble breathing     Current Outpatient Medications:  .  ALPRAZolam (XANAX) 0.25 MG tablet, TAKE 1 TABLET BY MOUTH  TWICE DAILY AS NEEDED FOR  ANXIETY, Disp: 180 tablet, Rfl: 1 .  bictegravir-emtricitabine-tenofovir AF (BIKTARVY) 50-200-25 MG TABS tablet, Take 1 tablet by mouth daily., Disp: 60 tablet, Rfl: 11 .  bictegravir-emtricitabine-tenofovir AF (BIKTARVY) 50-200-25 MG TABS tablet, Take 1 tablet by mouth daily., Disp: 30 tablet, Rfl: 11 .  buPROPion (WELLBUTRIN  XL) 300 MG 24 hr tablet, Take 1 tablet (300 mg total) by mouth daily., Disp: 30 tablet, Rfl: 11 .  Pitavastatin Calcium 4 MG TABS, Take 1 tablet (4 mg total) by mouth daily., Disp: 30 tablet, Rfl: 11 .  sildenafil (VIAGRA) 100 MG tablet, Take 0.5-1 tablets (50-100 mg total) by mouth daily as needed for erectile dysfunction., Disp: 90 tablet, Rfl: 3   Review of Systems  Constitutional: Negative for activity change, appetite change, chills, diaphoresis, fatigue, fever and unexpected weight change.  HENT: Negative for congestion, rhinorrhea, sinus pressure, sneezing, sore throat and trouble swallowing.   Eyes: Negative for photophobia and visual disturbance.  Respiratory: Negative for cough, chest tightness, shortness of breath, wheezing and stridor.   Cardiovascular: Negative for chest pain, palpitations and leg swelling.  Gastrointestinal: Negative for abdominal distention, abdominal pain, anal bleeding, blood in stool, constipation, diarrhea, nausea and vomiting.  Genitourinary: Negative for difficulty urinating, dysuria, flank pain and hematuria.  Musculoskeletal: Negative for arthralgias, back pain, gait problem, joint swelling and myalgias.  Skin: Negative for color change, pallor, rash and wound.  Neurological: Negative for dizziness, tremors, weakness and light-headedness.  Hematological: Negative for adenopathy. Does not bruise/bleed easily.  Psychiatric/Behavioral: Negative for agitation, behavioral problems, confusion, decreased concentration, dysphoric mood and sleep disturbance.       Objective:   Physical Exam Constitutional:      General: He is not in acute distress.    Appearance: He is not diaphoretic.  HENT:     Head: Normocephalic and atraumatic.     Right Ear: External ear normal.     Left Ear: External ear normal.     Nose: Nose normal.     Mouth/Throat:     Pharynx: No oropharyngeal exudate.  Eyes:     General: No scleral icterus.       Right eye: No discharge.         Left eye: No discharge.     Extraocular Movements: Extraocular movements intact.     Conjunctiva/sclera: Conjunctivae normal.     Pupils: Pupils are equal, round, and reactive to light.  Cardiovascular:     Rate and Rhythm: Normal rate and regular rhythm.     Heart sounds: Normal heart sounds. No murmur. No friction rub. No gallop.   Pulmonary:     Effort: Pulmonary effort is normal. No respiratory distress.     Breath sounds: Normal breath sounds. No wheezing or rales.  Abdominal:     General: Bowel sounds are normal. There is no  distension.     Palpations: Abdomen is soft.     Tenderness: There is no abdominal tenderness. There is no rebound.  Musculoskeletal:        General: No tenderness. Normal range of motion.     Cervical back: Normal range of motion and neck supple.  Lymphadenopathy:     Cervical: No cervical adenopathy.  Skin:    General: Skin is warm and dry.     Coloration: Skin is not pale.     Findings: No erythema or rash.  Neurological:     General: No focal deficit present.     Mental Status: He is alert and oriented to person, place, and time. Mental status is at baseline.     Motor: No weakness.     Coordination: Coordination normal.     Gait: Gait normal.  Psychiatric:        Mood and Affect: Mood normal.        Behavior: Behavior normal.        Thought Content: Thought content normal.        Judgment: Judgment normal.           Assessment & Plan:   Normal Exam for BeeHIVE study

## 2019-10-31 LAB — HEPATITIS B CORE ANTIBODY, TOTAL: Hep B Core Total Ab: NONREACTIVE

## 2019-10-31 LAB — COMPREHENSIVE METABOLIC PANEL
AG Ratio: 2 (calc) (ref 1.0–2.5)
ALT: 30 U/L (ref 9–46)
AST: 19 U/L (ref 10–35)
Albumin: 4.7 g/dL (ref 3.6–5.1)
Alkaline phosphatase (APISO): 74 U/L (ref 35–144)
BUN: 16 mg/dL (ref 7–25)
CO2: 29 mmol/L (ref 20–32)
Calcium: 9.8 mg/dL (ref 8.6–10.3)
Chloride: 105 mmol/L (ref 98–110)
Creat: 1.09 mg/dL (ref 0.70–1.33)
Globulin: 2.3 g/dL (calc) (ref 1.9–3.7)
Glucose, Bld: 97 mg/dL (ref 65–99)
Potassium: 4.6 mmol/L (ref 3.5–5.3)
Sodium: 141 mmol/L (ref 135–146)
Total Bilirubin: 0.7 mg/dL (ref 0.2–1.2)
Total Protein: 7 g/dL (ref 6.1–8.1)

## 2019-10-31 LAB — PROTIME-INR
INR: 1.1
Prothrombin Time: 11.6 s — ABNORMAL HIGH (ref 9.0–11.5)

## 2019-10-31 LAB — HIV-1 RNA QUANT-NO REFLEX-BLD
HIV 1 RNA Quant: <20 copies/mL
HIV-1 RNA Quant, Log: <1.3 Log copies/mL

## 2019-10-31 LAB — BILIRUBIN, DIRECT: Bilirubin, Direct: 0.2 mg/dL (ref 0.0–0.2)

## 2019-10-31 LAB — HEPATITIS B SURFACE ANTIBODY,QUALITATIVE: Hep B S Ab: NONREACTIVE

## 2019-10-31 LAB — PHOSPHORUS: Phosphorus: 3.6 mg/dL (ref 2.5–4.5)

## 2019-10-31 LAB — HEPATITIS B SURFACE ANTIGEN: Hepatitis B Surface Ag: NONREACTIVE

## 2019-10-31 LAB — CK: Total CK: 55 U/L (ref 44–196)

## 2019-11-21 ENCOUNTER — Encounter: Payer: Managed Care, Other (non HMO) | Admitting: *Deleted

## 2019-11-22 ENCOUNTER — Encounter: Payer: Self-pay | Admitting: *Deleted

## 2019-11-22 ENCOUNTER — Other Ambulatory Visit: Payer: Self-pay

## 2019-11-22 VITALS — BP 125/85 | HR 67 | Temp 98.2°F | Ht 69.0 in | Wt 202.1 lb

## 2019-11-22 DIAGNOSIS — Z006 Encounter for examination for normal comparison and control in clinical research program: Secondary | ICD-10-CM

## 2019-11-22 NOTE — Research (Signed)
[name] was enrolled on the A5379 BeeHive study after eligibility was confirmed. He denies any new complaints or medications. No vaccines in the past 14 days. He was randomized to Group A, Arm 1 and received Heplisav-B vaccine today. Post vaccination he denied any side effects and no fever. He was alos instructed on the diary card and what he needed to report as well as checking his temp. Every day for the next week. He will return on July 16th and get his second and last dose of Heplisav.

## 2019-11-23 LAB — COMPREHENSIVE METABOLIC PANEL
AG Ratio: 2 (calc) (ref 1.0–2.5)
ALT: 46 U/L (ref 9–46)
AST: 22 U/L (ref 10–35)
Albumin: 4.5 g/dL (ref 3.6–5.1)
Alkaline phosphatase (APISO): 76 U/L (ref 35–144)
BUN: 13 mg/dL (ref 7–25)
CO2: 28 mmol/L (ref 20–32)
Calcium: 9.7 mg/dL (ref 8.6–10.3)
Chloride: 107 mmol/L (ref 98–110)
Creat: 1.11 mg/dL (ref 0.70–1.33)
Globulin: 2.2 g/dL (calc) (ref 1.9–3.7)
Glucose, Bld: 79 mg/dL (ref 65–99)
Potassium: 4.4 mmol/L (ref 3.5–5.3)
Sodium: 142 mmol/L (ref 135–146)
Total Bilirubin: 0.5 mg/dL (ref 0.2–1.2)
Total Protein: 6.7 g/dL (ref 6.1–8.1)

## 2019-11-23 LAB — BILIRUBIN, DIRECT: Bilirubin, Direct: 0.1 mg/dL (ref 0.0–0.2)

## 2019-11-23 LAB — CK: Total CK: 52 U/L (ref 44–196)

## 2019-11-23 LAB — PHOSPHORUS: Phosphorus: 3.6 mg/dL (ref 2.5–4.5)

## 2019-11-24 LAB — CD4/CD8 (T-HELPER/T-SUPPRESSOR CELL)
CD4 % Helper T Cell: 33.7
CD4 Count: 506
CD8 % Suppressor T Cell: 32
CD8 T Cell Abs: 480

## 2019-11-27 ENCOUNTER — Encounter: Payer: Self-pay | Admitting: Infectious Disease

## 2019-12-10 ENCOUNTER — Encounter: Payer: Self-pay | Admitting: Internal Medicine

## 2019-12-10 ENCOUNTER — Other Ambulatory Visit: Payer: Self-pay

## 2019-12-10 ENCOUNTER — Ambulatory Visit: Payer: Managed Care, Other (non HMO) | Admitting: Internal Medicine

## 2019-12-10 VITALS — BP 138/86 | HR 64 | Temp 98.3°F | Ht 69.0 in | Wt 202.0 lb

## 2019-12-10 DIAGNOSIS — B20 Human immunodeficiency virus [HIV] disease: Secondary | ICD-10-CM

## 2019-12-10 DIAGNOSIS — F17200 Nicotine dependence, unspecified, uncomplicated: Secondary | ICD-10-CM | POA: Diagnosis not present

## 2019-12-10 DIAGNOSIS — L0231 Cutaneous abscess of buttock: Secondary | ICD-10-CM | POA: Diagnosis not present

## 2019-12-10 MED ORDER — CHLORHEXIDINE GLUCONATE 4 % EX LIQD: Freq: Every day | CUTANEOUS | 0 refills | Status: AC | PRN

## 2019-12-10 MED ORDER — CEPHALEXIN 500 MG PO CAPS: 500.0000 mg | ORAL_CAPSULE | Freq: Two times a day (BID) | ORAL | 0 refills | Status: AC

## 2019-12-10 NOTE — Patient Instructions (Signed)
We have sent in the keflex (cephalexin) to take 1 pill twice a day for 1 week.   We have sent in the chlorhexidine soap to use once a week for 2-3 weeks to decolonize the bacteria.   Keep taking the wellbutrin until 3 months off cigarettes then go down to 1/2 pill daily for 1-2 weeks then you can stop.

## 2019-12-10 NOTE — Progress Notes (Signed)
   Subjective:   Patient ID: [name], male    DOB: Jun 03, 1966, 54 y.o.   MRN: [id number]  HPI The patient is a 54 YO man coming in for concerns about lesion on his right buttocks. About 2 months ago had a lesion on the left upper thigh which did start with a small pimple looking thing which then grew. He tried to drain and stuff came out but filled back up and he ended up having to go to urgent care and have it drained more fully. This did take about 2 months to heal. Does have concurrent HIV which is well controlled. This area on the buttock he noticed about 1-2 days ago with discomfort. Cannot see well. Denies fevers or chills. Denies discharge on undergarments. Overall seems to be worsening. He has been taking wellbutrin to quit smoking and quit about 2 months ago. Wants to know how long he should keep taking this and how to get off the medication when he stops.   Review of Systems  Constitutional: Negative.   HENT: Negative.   Eyes: Negative.   Respiratory: Negative for cough, chest tightness and shortness of breath.   Cardiovascular: Negative for chest pain, palpitations and leg swelling.  Gastrointestinal: Negative for abdominal distention, abdominal pain, constipation, diarrhea, nausea and vomiting.  Musculoskeletal: Negative.   Skin: Positive for color change.  Neurological: Negative.   Psychiatric/Behavioral: Negative.     Objective:  Physical Exam Constitutional:      Appearance: He is well-developed.  HENT:     Head: Normocephalic and atraumatic.  Cardiovascular:     Rate and Rhythm: Normal rate and regular rhythm.  Pulmonary:     Effort: Pulmonary effort is normal. No respiratory distress.     Breath sounds: Normal breath sounds. No wheezing or rales.  Abdominal:     General: Bowel sounds are normal. There is no distension.     Palpations: Abdomen is soft.     Tenderness: There is no abdominal tenderness. There is no rebound.  Musculoskeletal:     Cervical  back: Normal range of motion.  Skin:    General: Skin is warm and dry.     Comments: Small head right buttock without discrete fluid pocket underneath, with some firmness underneath, no drainage present, no signs of cellulitis  Neurological:     Mental Status: He is alert and oriented to person, place, and time.     Coordination: Coordination normal.     Vitals:   12/10/19 1528  BP: 138/86  Pulse: 64  Temp: 98.3 F (36.8 C)  TempSrc: Oral  SpO2: 98%  Weight: 202 lb (91.6 kg)  Height: 5\' 9"  (1.753 m)    This visit occurred during the SARS-CoV-2 public health emergency.  Safety protocols were in place, including screening questions prior to the visit, additional usage of staff PPE, and extensive cleaning of exam room while observing appropriate contact time as indicated for disinfecting solutions.   Assessment & Plan:

## 2019-12-11 DIAGNOSIS — L0231 Cutaneous abscess of buttock: Secondary | ICD-10-CM | POA: Insufficient documentation

## 2019-12-11 NOTE — Assessment & Plan Note (Signed)
Has quit about 2 months ago and congratulated. Advised to remain smoke free for life. Have advised to remain on wellbutrin for another month, then cut in half for 1-2 weeks then stop.

## 2019-12-11 NOTE — Assessment & Plan Note (Signed)
Rx keflex 1 week, not amenable to drainage today. If persistent may need to return for I and D. Rx hibiclens to use externally to help decolonize the skin flora.

## 2019-12-11 NOTE — Assessment & Plan Note (Signed)
Well controlled and last CD4 count appropriate, not with current immune system compromise. Taking biktarvy without missing doses.

## 2019-12-20 ENCOUNTER — Encounter (INDEPENDENT_AMBULATORY_CARE_PROVIDER_SITE_OTHER): Payer: Managed Care, Other (non HMO) | Admitting: *Deleted

## 2019-12-20 ENCOUNTER — Other Ambulatory Visit: Payer: Self-pay

## 2019-12-20 ENCOUNTER — Other Ambulatory Visit: Payer: Managed Care, Other (non HMO)

## 2019-12-20 VITALS — BP 124/85 | HR 68 | Temp 98.3°F | Wt 201.4 lb

## 2019-12-20 DIAGNOSIS — F17200 Nicotine dependence, unspecified, uncomplicated: Secondary | ICD-10-CM

## 2019-12-20 DIAGNOSIS — D751 Secondary polycythemia: Secondary | ICD-10-CM

## 2019-12-20 DIAGNOSIS — Z006 Encounter for examination for normal comparison and control in clinical research program: Secondary | ICD-10-CM

## 2019-12-20 DIAGNOSIS — B20 Human immunodeficiency virus [HIV] disease: Secondary | ICD-10-CM

## 2019-12-20 DIAGNOSIS — E291 Testicular hypofunction: Secondary | ICD-10-CM

## 2019-12-20 NOTE — Research (Signed)
[name] was here for his week 4 visit for BeeHive study. He said he developed a small boil on his buttock similar to the one he had on his leg a few months ago. He went to the MD the next day and was given kelflex and it has now resolved. He did have a couple days of pain at the injection site from the last vaccine, but did not notice anything else. He was given his second and last vaccine of Heplisav today in his rt arm without any problem. He will be moving to [place] this weekend but still plans to come back for study visits for this study and Reprieve.

## 2019-12-21 LAB — CBC WITH DIFFERENTIAL/PLATELET
Absolute Monocytes: 403 cells/uL (ref 200–950)
Basophils Absolute: 39 cells/uL (ref 0–200)
Basophils Relative: 0.7 %
Eosinophils Absolute: 90 cells/uL (ref 15–500)
Eosinophils Relative: 1.6 %
HCT: 46.3 % (ref 38.5–50.0)
Hemoglobin: 16.1 g/dL (ref 13.2–17.1)
Lymphs Abs: 1383 cells/uL (ref 850–3900)
MCH: 34.5 pg — ABNORMAL HIGH (ref 27.0–33.0)
MCHC: 34.8 g/dL (ref 32.0–36.0)
MCV: 99.1 fL (ref 80.0–100.0)
MPV: 11.1 fL (ref 7.5–12.5)
Monocytes Relative: 7.2 %
Neutro Abs: 3685 cells/uL (ref 1500–7800)
Neutrophils Relative %: 65.8 %
Platelets: 232 10*3/uL (ref 140–400)
RBC: 4.67 10*6/uL (ref 4.20–5.80)
RDW: 12 % (ref 11.0–15.0)
Total Lymphocyte: 24.7 %
WBC: 5.6 10*3/uL (ref 3.8–10.8)

## 2019-12-21 LAB — HEPATIC FUNCTION PANEL
AG Ratio: 1.9 (calc) (ref 1.0–2.5)
ALT: 26 U/L (ref 9–46)
AST: 18 U/L (ref 10–35)
Albumin: 4.4 g/dL (ref 3.6–5.1)
Alkaline phosphatase (APISO): 75 U/L (ref 35–144)
Bilirubin, Direct: 0.2 mg/dL (ref 0.0–0.2)
Globulin: 2.3 g/dL (calc) (ref 1.9–3.7)
Indirect Bilirubin: 0.5 mg/dL (calc) (ref 0.2–1.2)
Total Bilirubin: 0.7 mg/dL (ref 0.2–1.2)
Total Protein: 6.7 g/dL (ref 6.1–8.1)

## 2019-12-23 ENCOUNTER — Other Ambulatory Visit: Payer: Managed Care, Other (non HMO)

## 2019-12-23 LAB — LIPID PANEL
Cholesterol: 136 mg/dL (ref <200)
HDL: 42 mg/dL (ref >=40)
LDL Cholesterol (Calc): 73 mg/dL (calc)
Non-HDL Cholesterol (Calc): 94 mg/dL (calc) (ref <130)
Total CHOL/HDL Ratio: 3.2 (calc) (ref <5.0)
Triglycerides: 127 mg/dL (ref <150)

## 2019-12-23 LAB — RPR: RPR Ser Ql: NONREACTIVE

## 2020-01-06 ENCOUNTER — Encounter: Payer: Managed Care, Other (non HMO) | Admitting: Infectious Disease

## 2020-01-14 ENCOUNTER — Encounter: Payer: Self-pay | Admitting: Internal Medicine

## 2020-01-14 MED ORDER — ALPRAZOLAM 0.25 MG PO TABS: 0.2500 mg | ORAL_TABLET | Freq: Two times a day (BID) | ORAL | 1 refills | Status: DC | PRN

## 2020-01-17 ENCOUNTER — Encounter (INDEPENDENT_AMBULATORY_CARE_PROVIDER_SITE_OTHER): Payer: Self-pay | Admitting: *Deleted

## 2020-01-17 ENCOUNTER — Other Ambulatory Visit: Payer: Self-pay

## 2020-01-17 VITALS — BP 136/93 | HR 57 | Temp 97.9°F | Wt 205.1 lb

## 2020-01-17 DIAGNOSIS — Z006 Encounter for examination for normal comparison and control in clinical research program: Secondary | ICD-10-CM

## 2020-01-17 LAB — CBC WITH DIFFERENTIAL/PLATELET
Absolute Monocytes: 366 cells/uL (ref 200–950)
Basophils Absolute: 43 cells/uL (ref 0–200)
Basophils Relative: 0.7 %
Eosinophils Absolute: 81 cells/uL (ref 15–500)
Eosinophils Relative: 1.3 %
HCT: 45.7 % (ref 38.5–50.0)
Hemoglobin: 15.8 g/dL (ref 13.2–17.1)
Lymphs Abs: 1773 cells/uL (ref 850–3900)
MCH: 34.6 pg — ABNORMAL HIGH (ref 27.0–33.0)
MCHC: 34.6 g/dL (ref 32.0–36.0)
MCV: 100.2 fL — ABNORMAL HIGH (ref 80.0–100.0)
MPV: 11.6 fL (ref 7.5–12.5)
Monocytes Relative: 5.9 %
Neutro Abs: 3937 cells/uL (ref 1500–7800)
Neutrophils Relative %: 63.5 %
Platelets: 204 10*3/uL (ref 140–400)
RBC: 4.56 10*6/uL (ref 4.20–5.80)
RDW: 12 % (ref 11.0–15.0)
Total Lymphocyte: 28.6 %
WBC: 6.2 10*3/uL (ref 3.8–10.8)

## 2020-01-17 LAB — HEPATIC FUNCTION PANEL
AG Ratio: 1.8 (calc) (ref 1.0–2.5)
ALT: 42 U/L (ref 9–46)
AST: 22 U/L (ref 10–35)
Albumin: 4.4 g/dL (ref 3.6–5.1)
Alkaline phosphatase (APISO): 82 U/L (ref 35–144)
Bilirubin, Direct: 0.1 mg/dL (ref 0.0–0.2)
Globulin: 2.4 g/dL (calc) (ref 1.9–3.7)
Indirect Bilirubin: 0.6 mg/dL (calc) (ref 0.2–1.2)
Total Bilirubin: 0.7 mg/dL (ref 0.2–1.2)
Total Protein: 6.8 g/dL (ref 6.1–8.1)

## 2020-01-17 NOTE — Research (Signed)
[name] was here for his week 8 visit for [phone]. He had a few days of injection site soreness but no other side effects from the vaccine. He will be returning in 4 weeks for his next A5379 visit and his Reprieve study visit.

## 2020-02-05 ENCOUNTER — Telehealth: Payer: Managed Care, Other (non HMO) | Admitting: Infectious Disease

## 2020-02-14 ENCOUNTER — Encounter (INDEPENDENT_AMBULATORY_CARE_PROVIDER_SITE_OTHER): Payer: Self-pay | Admitting: *Deleted

## 2020-02-14 ENCOUNTER — Other Ambulatory Visit: Payer: Self-pay

## 2020-02-14 VITALS — BP 133/95 | HR 60 | Temp 98.2°F | Wt 204.2 lb

## 2020-02-14 DIAGNOSIS — Z006 Encounter for examination for normal comparison and control in clinical research program: Secondary | ICD-10-CM

## 2020-02-14 NOTE — Research (Signed)
[name] was here for both a 5379 and a Reprieve study visit. He denies any new problems or medications. He says his adherence to the Reprieve study med is excellent. He will be returning in 3 months for the next A5379 visit.

## 2020-02-18 ENCOUNTER — Other Ambulatory Visit: Payer: Self-pay

## 2020-02-18 ENCOUNTER — Telehealth (INDEPENDENT_AMBULATORY_CARE_PROVIDER_SITE_OTHER): Payer: Managed Care, Other (non HMO) | Admitting: Infectious Disease

## 2020-02-18 ENCOUNTER — Encounter: Payer: Self-pay | Admitting: Infectious Disease

## 2020-02-18 DIAGNOSIS — E291 Testicular hypofunction: Secondary | ICD-10-CM

## 2020-02-18 DIAGNOSIS — R7989 Other specified abnormal findings of blood chemistry: Secondary | ICD-10-CM

## 2020-02-18 DIAGNOSIS — D751 Secondary polycythemia: Secondary | ICD-10-CM

## 2020-02-18 DIAGNOSIS — B20 Human immunodeficiency virus [HIV] disease: Secondary | ICD-10-CM

## 2020-02-18 DIAGNOSIS — Z87891 Personal history of nicotine dependence: Secondary | ICD-10-CM

## 2020-02-18 HISTORY — DX: Personal history of nicotine dependence: Z87.891

## 2020-02-18 NOTE — Progress Notes (Signed)
Virtual Visit via Telephone Note  I connected with [name] on 02/18/20 at 10:15 AM EDT by telephone and verified that I am speaking with the correct person using two identifiers.  Location: Patient: Home Provider: [facility]   I discussed the limitations, risks, security and privacy concerns of performing an evaluation and management service by telephone and the availability of in person appointments. I also discussed with the patient that there may be a patient responsible charge related to this service. The patient expressed understanding and agreed to proceed.   History of Present Illness:  [name] is a 54 year old Caucasian male living with HIV that is currently perfectly controlled on Biktarvy.  He was initially found to have non-Hodgkin lymphoma but it is been treated and cured.  He does have history of low testosterone but also history of smoking.  While on testosterone replacement therapy and while still smoking he developed fairly significant secondary polycythemia.  Since then he has stopped smoking and his hemoglobin and hematocrit have come down.  He is also been off of his testosterone and most recent testosterone level was in the 100s.  He has moved to [facility] and asked about providers in that area.  I recommended either [facility] versus the [facility].  He may also consider staying in care here since he does come to our [facility] for 2 different ACT G studies.  I have therefore scheduled him for labs with his next January visit with Maudie Mercury and a phone visit with me thereafter.  Review of systems as above otherwise 12 point view systems negative   Past Medical History:  Diagnosis Date  . ALLERGIC RHINITIS   . Anal fissure    Chronic, recurrent anal bleeding with pain  . Depression   . Former smoker 02/18/2020  . Grieving 12/09/2015  . HIV INFECTION 07/05/2010 dx  . 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 Low testosterone 05/07/2018  . NEUTROPENIA, DRUG-INDUCED    on chemo early 2012  . 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 Problems in relationship with spouse or partner 12/09/2015  . 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 Smoker 05/07/2018  . 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 Thrombocytopenia ([place])    resolved  . Toe fracture, right 10/04/2012   great toe, nonsurgical mgmt [name])    Past Surgical History:  Procedure Laterality Date  . ANAL INTRAEPITHELIAL NEOPLASIA EXCISION  06/2011   wide exiscion of Sq cell ca  . LYMPH NODE BIOPSY  1975   L ingunial - nondx  . [place] fracture surgery  2010   L elbow  . NASAL SEPTUM SURGERY  2006  . Precancer lesion removed from anus  2012    Family History  Problem Relation Age of Onset  . Cancer Father 81       unknown primary  . Alcohol abuse Father   . Cirrhosis Father        alcohol  . Alcohol abuse Mother   . Hypertension Mother   . Hyperlipidemia Brother   . Macular degeneration Brother        histoplasm  . Hypertension Sister 48       carotid blockage      Social History   Socioeconomic History  .  Marital status: Married    Spouse name: Not on file  . Number of children: Not on file  . Years of education: Not on file  . Highest education level: Not on file  Occupational History  . Not on file  Tobacco Use  . Smoking status: Former Smoker    Packs/day: 1.00    Types: Cigarettes    Quit date: 10/12/2019    Years since quitting: 0.3  . Smokeless tobacco: Never Used  Substance and Sexual Activity  . Alcohol use: Yes    Alcohol/week: 0.0 standard drinks    Comment: occasional, socially  . Drug use: No  . Sexual activity: Yes    Comment: delined condoms  Other Topics Concern  . 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 Difficulty of Paying Living Expenses: Not on file  Food Insecurity:   . Worried About Charity fundraiser in the Last Year: Not on file  . Ran Out of Food in the Last Year: Not on file  Transportation Needs:   . Lack of Transportation (Medical): Not on file  . Lack of Transportation (Non-Medical): Not on file  Physical Activity:   . Days of Exercise per Week: Not on file  . Minutes of Exercise per Session: Not on file  Stress:   . Feeling of Stress : Not on file  Social Connections:   . Frequency of Communication with Friends and Family: Not on file  . Frequency of Social Gatherings with Friends and Family: Not on file  . Attends Religious Services: Not on file  . Active Member of Clubs or Organizations: Not on file  . Attends Archivist Meetings: Not on file  . Marital Status: Not on file    Allergies  Allergen Reactions  . Sulfonamide Derivatives     REACTION: rash, swelling, trouble breathing     Current Outpatient Medications:  .  ALPRAZolam (XANAX) 0.25 MG tablet, Take 1 tablet (0.25 mg total) by mouth 2 (two) times daily as needed. for anxiety, Disp: 180 tablet, Rfl: 1 .  bictegravir-emtricitabine-tenofovir AF (BIKTARVY) 50-200-25 MG TABS tablet, Take 1 tablet by mouth daily., Disp: 60 tablet, Rfl: 11 .  bictegravir-emtricitabine-tenofovir AF (BIKTARVY) 50-200-25 MG TABS tablet, Take 1 tablet by mouth daily., Disp: 30 tablet, Rfl: 11 .  buPROPion (WELLBUTRIN XL) 300 MG 24 hr tablet, Take 1 tablet (300 mg total) by mouth daily., Disp: 30 tablet, Rfl: 11 .  cephALEXin (KEFLEX) 500 MG capsule, Take 1 capsule (500 mg total) by mouth 2 (two) times daily., Disp: 14 capsule, Rfl: 0 .  chlorhexidine (HIBICLENS) 4 % external liquid, Apply topically daily as needed., Disp: 120 mL, Rfl: 0 .  Pitavastatin Calcium 4 MG TABS, Take 1 tablet (4 mg  total) by mouth daily., Disp: 30 tablet, Rfl: 11 .  sildenafil (VIAGRA) 100 MG tablet, Take 0.5-1 tablets (50-100 mg total) by mouth daily as needed for erectile dysfunction., Disp: 90 tablet, Rfl: 3  Observations/Objective: [name] appeared to be doing quite well on our phone call today  Assessment and Plan: HIV disease continue Biktarvy  Secondary polycythemia seems improved off of tobacco but also off testosterone  Low testosterone level can consider starting a low dose of testosterone again and rechecking a CBC in the next 2 months.  Follow Up Instructions:    I discussed the  assessment and treatment plan with the patient. The patient was provided an opportunity to ask questions and all were answered. The patient agreed with the plan and demonstrated an understanding of the instructions.   The patient was advised to call back or seek an in-person evaluation if the symptoms worsen or if the condition fails to improve as anticipated.  I provided 21 minutes of non-face-to-face time during this encounter.   Alcide Evener, MD

## 2020-02-19 ENCOUNTER — Other Ambulatory Visit: Payer: Self-pay | Admitting: Infectious Disease

## 2020-02-19 ENCOUNTER — Telehealth: Payer: Self-pay

## 2020-02-19 MED ORDER — TESTOSTERONE CYPIONATE 200 MG/ML IM SOLN
100.0000 mg | INTRAMUSCULAR | 4 refills | Status: AC
Start: 2020-02-19 — End: ?

## 2020-02-19 NOTE — Telephone Encounter (Signed)
-----   Message from Truman Hayward, [name] sent at 02/19/2020  9:25 AM EDT ----- Regarding: RE: restarting testosterone Called in tesstoterone for him. He needs rrepeat CBC in 1 month I can write script on paper that he can take [facility] and then have labs to look at. Can we mail him the scriopt? ----- Message ----- From: Darletta Moll, RPH-CPP Sent: 02/18/2020   2:57 PM EDT To: Truman Hayward, [name] Subject: RE: restarting testosterone                    Yea I think I would restart low like you did at the beginning with him - 75 mg to 100 mg weekly. Then check labs 2-4 weeks after starting. ----- Message ----- From: [name], Marvin Islam, [name] Sent: 02/18/2020  11:17 AM EDT To: Darletta Moll, RPH-CPP Subject: restarting testosterone                        Cassie I had [name] come off of testosterone due to a secondary polycythemia.  He has stopped smoking for several months now and his secondary polycythemia has improved.He is interested in restarting his testosterone but I was curious what dose would you recommend I start him at lowest possible?

## 2020-02-19 NOTE — Telephone Encounter (Signed)
Address verified and script for CBC w/ diff in 4 weeks sent to patient's home address listed in epic.  [name]

## 2020-02-19 NOTE — Telephone Encounter (Signed)
Received hard script from Dr. Tommy Medal to draw CBC w/ diff in 4 weeks. Patient has requested orders to be mailed to his home. Called patient to verify correct address in epic. Left patient a voicemail to call the office to verify address. Will continue to follow [facility]

## 2020-02-21 ENCOUNTER — Telehealth: Payer: Self-pay

## 2020-02-21 NOTE — Telephone Encounter (Signed)
Sent PA in covermymeds with labs from 1/21 testosterone level and notes. Pending approval.

## 2020-05-08 ENCOUNTER — Encounter (INDEPENDENT_AMBULATORY_CARE_PROVIDER_SITE_OTHER): Payer: Self-pay | Admitting: *Deleted

## 2020-05-08 ENCOUNTER — Other Ambulatory Visit: Payer: Self-pay

## 2020-05-08 VITALS — BP 126/88 | HR 60 | Temp 98.1°F | Wt 212.0 lb

## 2020-05-08 DIAGNOSIS — Z006 Encounter for examination for normal comparison and control in clinical research program: Secondary | ICD-10-CM

## 2020-05-08 NOTE — Research (Signed)
[name] was here for his week 24 visit for [phone]. He has transferred his HIV care to [facility] and wants to transfer his study participation also. He is on both Reprieve and [id number]. His next appt for research is due 1/7, so I told him we would try to get him transferred by then, but not sure if we could get everything done in time. He is willing to come back here for the 1/7 visit.  He denies any new problems or concerns. He did get his Covid booster and flu shot in October.

## 2020-06-12 ENCOUNTER — Encounter (INDEPENDENT_AMBULATORY_CARE_PROVIDER_SITE_OTHER): Payer: Self-pay | Admitting: *Deleted

## 2020-06-12 ENCOUNTER — Other Ambulatory Visit: Payer: Self-pay

## 2020-06-12 VITALS — BP 136/93 | HR 66 | Temp 98.0°F | Wt 215.0 lb

## 2020-06-12 DIAGNOSIS — Z006 Encounter for examination for normal comparison and control in clinical research program: Secondary | ICD-10-CM

## 2020-06-12 NOTE — Research (Signed)
[name] was here for his month 80 visit for Reprieve and [id number] month 60 and [id number] week 28. He has moved to [place] and transferred his care to [facility]. We are in the process of transferring his research to [facility] also.  He denies any new problems, medications, etc. He is due for Reprieve and [phone] around first week of June.

## 2020-06-13 LAB — COMPREHENSIVE METABOLIC PANEL
AG Ratio: 2 (calc) (ref 1.0–2.5)
ALT: 29 U/L (ref 9–46)
AST: 16 U/L (ref 10–35)
Albumin: 4.5 g/dL (ref 3.6–5.1)
Alkaline phosphatase (APISO): 72 U/L (ref 35–144)
BUN: 12 mg/dL (ref 7–25)
CO2: 29 mmol/L (ref 20–32)
Calcium: 9.4 mg/dL (ref 8.6–10.3)
Chloride: 104 mmol/L (ref 98–110)
Creat: 1.13 mg/dL (ref 0.70–1.33)
Globulin: 2.3 g/dL (calc) (ref 1.9–3.7)
Glucose, Bld: 89 mg/dL (ref 65–99)
Potassium: 4.2 mmol/L (ref 3.5–5.3)
Sodium: 141 mmol/L (ref 135–146)
Total Bilirubin: 0.7 mg/dL (ref 0.2–1.2)
Total Protein: 6.8 g/dL (ref 6.1–8.1)

## 2020-06-13 LAB — CK: Total CK: 67 U/L (ref 44–196)

## 2020-06-13 LAB — PHOSPHORUS: Phosphorus: 2.5 mg/dL (ref 2.5–4.5)

## 2020-06-15 NOTE — Addendum Note (Signed)
Addended by: Bobbie Stack on: 06/15/2020 12:46 PM   Modules accepted: Orders

## 2020-06-17 LAB — BILIRUBIN, DIRECT: Bilirubin, Direct: 0.1 mg/dL (ref 0.0–0.2)

## 2020-07-01 ENCOUNTER — Telehealth: Payer: Managed Care, Other (non HMO) | Admitting: Infectious Disease

## 2020-07-15 ENCOUNTER — Encounter: Payer: Self-pay | Admitting: Infectious Disease

## 2020-07-15 LAB — CD4/CD8 (T-HELPER/T-SUPPRESSOR CELL): HIV 1 RNA UltraQuant: <40

## 2020-07-17 ENCOUNTER — Other Ambulatory Visit: Payer: Self-pay | Admitting: Internal Medicine

## 2020-07-17 NOTE — Telephone Encounter (Signed)
LR: 01-14-2020 Qty: 180 with 1 refill  Last office visit: 12-10-2019 Upcoming appointment: No pending appt

## 2021-01-20 ENCOUNTER — Other Ambulatory Visit: Payer: Self-pay | Admitting: Internal Medicine

## 2021-01-25 NOTE — Telephone Encounter (Signed)
Can have 30 day supply but needs visit for further.

## 2021-01-26 ENCOUNTER — Encounter: Payer: Self-pay | Admitting: Internal Medicine
